# Patient Record
Sex: Female | Born: 1985 | Race: Black or African American | Hispanic: No | Marital: Single | Smoking: Former smoker
Health system: Southern US, Community
[De-identification: ages and names within clinical notes are randomized; demographics above are authoritative.]

## PROBLEM LIST (undated history)

## (undated) ENCOUNTER — Emergency Department (HOSPITAL_COMMUNITY): Disposition: A | Discharge status: Home / Self Care | Payer: Self-pay

## (undated) DIAGNOSIS — R0602 Shortness of breath: Secondary | ICD-10-CM

## (undated) DIAGNOSIS — J302 Other seasonal allergic rhinitis: Secondary | ICD-10-CM

## (undated) DIAGNOSIS — Z720 Tobacco use: Secondary | ICD-10-CM

---
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
## 2006-01-11 ENCOUNTER — Emergency Department (HOSPITAL_COMMUNITY): Admission: EM | Admit: 2006-01-11 | Discharge: 2006-01-11 | Payer: Self-pay | Admitting: Family Medicine

## 2009-06-16 ENCOUNTER — Emergency Department (HOSPITAL_COMMUNITY): Admission: EM | Admit: 2009-06-16 | Discharge: 2009-06-16 | Payer: Self-pay | Admitting: Emergency Medicine

## 2009-06-23 ENCOUNTER — Emergency Department (HOSPITAL_COMMUNITY): Admission: EM | Admit: 2009-06-23 | Discharge: 2009-06-23 | Payer: Self-pay | Admitting: Family Medicine

## 2010-10-18 ENCOUNTER — Inpatient Hospital Stay (INDEPENDENT_AMBULATORY_CARE_PROVIDER_SITE_OTHER)
Admission: RE | Admit: 2010-10-18 | Discharge: 2010-10-18 | Disposition: A | Discharge status: Home / Self Care | Payer: Self-pay | Source: Ambulatory Visit | Attending: Emergency Medicine | Admitting: Emergency Medicine

## 2010-10-18 DIAGNOSIS — J45909 Unspecified asthma, uncomplicated: Secondary | ICD-10-CM

## 2010-11-01 ENCOUNTER — Emergency Department (HOSPITAL_COMMUNITY)
Admission: EM | Admit: 2010-11-01 | Discharge: 2010-11-01 | Disposition: A | Discharge status: Home / Self Care | Payer: Self-pay | Attending: Emergency Medicine | Admitting: Emergency Medicine

## 2010-11-01 ENCOUNTER — Emergency Department (HOSPITAL_COMMUNITY): Payer: Self-pay

## 2010-11-01 DIAGNOSIS — R059 Cough, unspecified: Secondary | ICD-10-CM | POA: Insufficient documentation

## 2010-11-01 DIAGNOSIS — R0989 Other specified symptoms and signs involving the circulatory and respiratory systems: Secondary | ICD-10-CM | POA: Insufficient documentation

## 2010-11-01 DIAGNOSIS — R0609 Other forms of dyspnea: Secondary | ICD-10-CM | POA: Insufficient documentation

## 2010-11-01 DIAGNOSIS — J40 Bronchitis, not specified as acute or chronic: Secondary | ICD-10-CM | POA: Insufficient documentation

## 2010-11-01 DIAGNOSIS — R05 Cough: Secondary | ICD-10-CM | POA: Insufficient documentation

## 2011-02-11 ENCOUNTER — Emergency Department (HOSPITAL_COMMUNITY)
Admission: EM | Admit: 2011-02-11 | Discharge: 2011-02-11 | Disposition: A | Discharge status: Home / Self Care | Payer: Self-pay | Attending: Emergency Medicine | Admitting: Emergency Medicine

## 2011-02-11 ENCOUNTER — Encounter: Payer: Self-pay | Admitting: Emergency Medicine

## 2011-02-11 DIAGNOSIS — J45901 Unspecified asthma with (acute) exacerbation: Secondary | ICD-10-CM | POA: Insufficient documentation

## 2011-02-11 DIAGNOSIS — R0602 Shortness of breath: Secondary | ICD-10-CM | POA: Insufficient documentation

## 2011-02-11 MED ORDER — ALBUTEROL SULFATE HFA 108 (90 BASE) MCG/ACT IN AERS
2.0000 | INHALATION_SPRAY | RESPIRATORY_TRACT | Status: DC | PRN
  Administered 2011-02-11: 2 via RESPIRATORY_TRACT
  Filled 2011-02-11: qty 6.7

## 2011-02-11 MED ORDER — ALBUTEROL SULFATE (5 MG/ML) 0.5% IN NEBU
10.0000 mg | INHALATION_SOLUTION | Freq: Once | RESPIRATORY_TRACT | Status: AC
  Administered 2011-02-11: 5 mg via RESPIRATORY_TRACT
  Filled 2011-02-11: qty 1

## 2011-02-11 MED ORDER — IPRATROPIUM BROMIDE 0.02 % IN SOLN
0.5000 mg | Freq: Once | RESPIRATORY_TRACT | Status: AC
  Administered 2011-02-11: 0.5 mg via RESPIRATORY_TRACT
  Filled 2011-02-11: qty 2.5

## 2011-02-11 MED ORDER — ALBUTEROL SULFATE (5 MG/ML) 0.5% IN NEBU
5.0000 mg | INHALATION_SOLUTION | Freq: Once | RESPIRATORY_TRACT | Status: AC
  Administered 2011-02-11: 5 mg via RESPIRATORY_TRACT
  Filled 2011-02-11: qty 0.5

## 2011-02-11 MED ORDER — PREDNISONE 10 MG PO TABS: 20.0000 mg | ORAL_TABLET | Freq: Every day | ORAL | Status: AC

## 2011-02-11 MED ORDER — PREDNISONE 20 MG PO TABS
60.0000 mg | ORAL_TABLET | Freq: Once | ORAL | Status: AC
  Administered 2011-02-11: 60 mg via ORAL
  Filled 2011-02-11: qty 3

## 2011-02-11 NOTE — ED Provider Notes (Signed)
History     CSN: [id number] Arrival date & time: 02/11/2011  8:27 AM   First MD Initiated Contact with Patient 02/11/11 [phone]      Chief Complaint  Patient presents with  . Asthma    (Consider location/radiation/quality/duration/timing/severity/associated sxs/prior treatment) Patient is a 25 y.o. female presenting with shortness of breath.  Shortness of Breath  The current episode started yesterday. The onset was gradual. The problem occurs occasionally. The problem has been gradually worsening. The problem is moderate. The symptoms are relieved by nothing. The symptoms are aggravated by smoke exposure. Associated symptoms include chest pressure, shortness of breath and wheezing. Pertinent negatives include no fever, no rhinorrhea, no sore throat, no stridor and no cough. She has not inhaled smoke recently. She has had no prior hospitalizations. She has had no prior ICU admissions. She has had no prior intubations. Her past medical history is significant for asthma and past wheezing. She has been behaving normally.   here today with complaint of wheezing since 8 PM last night. States that her chest became "tight" around 12 midnight. She has a past medical history of asthma and has run out of her inhaler. She does not have a doctor or insurance. Her last asthma attack was in August and she went to [place] [facility]. Received albuterol in route and continues to wheeze in all 4 lips. Denies upper respiratory 6 infection runny nose sneezing or fever.   No past medical history on file.  No past surgical history on file.  No family history on file.  History  Substance Use Topics  . Smoking status: Not on file  . Smokeless tobacco: Not on file  . Alcohol Use: Not on file    OB History    Grav Para Term Preterm Abortions TAB SAB Ect Mult Living                  Review of Systems  Constitutional: Negative for fever.  HENT: Negative for sore throat and rhinorrhea.   Respiratory: Positive for  shortness of breath and wheezing. Negative for cough and stridor.   All other systems reviewed and are negative.    Allergies  Review of patient's allergies indicates no known allergies.  Home Medications  No current outpatient prescriptions on file.  BP 115/64  Pulse 125  Temp(Src) 98.3 F (36.8 C) (Oral)  Resp 22  SpO2 99%  Physical Exam  Constitutional: She is oriented to person, place, and time. She appears well-developed and well-nourished. No distress.  Eyes: Pupils are equal, round, and reactive to light.  Neck: Normal range of motion. Neck supple.  Cardiovascular:       tachycardia  Pulmonary/Chest: No respiratory distress. She has wheezes. She has no rales. She exhibits no tenderness.  Abdominal: Soft.  Musculoskeletal: Normal range of motion.  Neurological: She is alert and oriented to person, place, and time.  Skin: Skin is warm and dry.  Psychiatric: She has a normal mood and affect.    ED Course  Procedures (including critical care time)  Labs Reviewed - No data to display No results found.   No diagnosis found.    MDM  Asthma attack, out of albuterol inhaler.  Will get a pcp to follow up with or return to ER if worse.  Prednisone x 5 days.  Refill inhaler.        Jethro Bastos, NP 02/11/11 [phone]

## 2011-02-11 NOTE — ED Notes (Signed)
Per ems report pt coming from home where she began having difficulty breathing last night.  Pt was taking mucinex and had been out of here asthma medication at home.  ems gave 10mg  albuterol. 0.5mg  atrovent on arrival.

## 2011-02-11 NOTE — ED Notes (Signed)
Pt states she is feeling better after treatment.   Pt still having expiratory wheezes

## 2011-02-11 NOTE — ED Provider Notes (Signed)
Medical screening examination/treatment/procedure(s) were performed by non-physician practitioner and as supervising physician I was immediately available for consultation/collaboration.   Laray Anger, DO 02/11/11 Izell [place]

## 2011-02-26 ENCOUNTER — Observation Stay (HOSPITAL_COMMUNITY)
Admission: EM | Admit: 2011-02-26 | Discharge: 2011-02-28 | Disposition: A | Discharge status: Home / Self Care | Payer: Self-pay | Attending: Family Medicine | Admitting: Family Medicine

## 2011-02-26 ENCOUNTER — Emergency Department (HOSPITAL_COMMUNITY): Payer: Self-pay

## 2011-02-26 ENCOUNTER — Encounter (HOSPITAL_COMMUNITY): Payer: Self-pay | Admitting: *Deleted

## 2011-02-26 DIAGNOSIS — Z79899 Other long term (current) drug therapy: Secondary | ICD-10-CM | POA: Insufficient documentation

## 2011-02-26 DIAGNOSIS — Z720 Tobacco use: Secondary | ICD-10-CM | POA: Insufficient documentation

## 2011-02-26 DIAGNOSIS — F17200 Nicotine dependence, unspecified, uncomplicated: Secondary | ICD-10-CM | POA: Insufficient documentation

## 2011-02-26 DIAGNOSIS — E876 Hypokalemia: Secondary | ICD-10-CM | POA: Insufficient documentation

## 2011-02-26 DIAGNOSIS — J45901 Unspecified asthma with (acute) exacerbation: Principal | ICD-10-CM | POA: Insufficient documentation

## 2011-02-26 DIAGNOSIS — R0602 Shortness of breath: Secondary | ICD-10-CM | POA: Insufficient documentation

## 2011-02-26 DIAGNOSIS — J4540 Moderate persistent asthma, uncomplicated: Secondary | ICD-10-CM | POA: Insufficient documentation

## 2011-02-26 HISTORY — DX: Tobacco use: Z72.0

## 2011-02-26 HISTORY — DX: Shortness of breath: R06.02

## 2011-02-26 LAB — POCT I-STAT, CHEM 8
Glucose, Bld: 104 mg/dL — ABNORMAL HIGH (ref 70–99)
HCT: 41 % (ref 36.0–46.0)
Hemoglobin: 13.9 g/dL (ref 12.0–15.0)
Potassium: 3.1 mEq/L — ABNORMAL LOW (ref 3.5–5.1)

## 2011-02-26 LAB — DIFFERENTIAL
Basophils Relative: 1 % (ref 0–1)
Eosinophils Absolute: 0.1 10*3/uL (ref 0.0–0.7)
Monocytes Relative: 3 % (ref 3–12)
Neutrophils Relative %: 75 % (ref 43–77)

## 2011-02-26 LAB — CBC
Hemoglobin: 13 g/dL (ref 12.0–15.0)
MCH: 33.2 pg (ref 26.0–34.0)
MCHC: 34.8 g/dL (ref 30.0–36.0)
MCHC: 34.2 g/dL (ref 30.0–36.0)
Platelets: 228 10*3/uL (ref 150–400)
Platelets: 242 10*3/uL (ref 150–400)
RDW: 11.8 % (ref 11.5–15.5)

## 2011-02-26 MED ORDER — ALBUTEROL SULFATE HFA 108 (90 BASE) MCG/ACT IN AERS
2.0000 | INHALATION_SPRAY | Freq: Four times a day (QID) | RESPIRATORY_TRACT | Status: DC
  Administered 2011-02-26 (×3): 2 via RESPIRATORY_TRACT
  Filled 2011-02-26: qty 6.7

## 2011-02-26 MED ORDER — ALBUTEROL SULFATE (5 MG/ML) 0.5% IN NEBU
5.0000 mg | INHALATION_SOLUTION | Freq: Once | RESPIRATORY_TRACT | Status: AC
  Administered 2011-02-26: 5 mg via RESPIRATORY_TRACT
  Filled 2011-02-26: qty 1

## 2011-02-26 MED ORDER — ALBUTEROL SULFATE (5 MG/ML) 0.5% IN NEBU
2.5000 mg | INHALATION_SOLUTION | Freq: Once | RESPIRATORY_TRACT | Status: AC
  Administered 2011-02-26: 2.5 mg via RESPIRATORY_TRACT
  Filled 2011-02-26: qty 0.5

## 2011-02-26 MED ORDER — POTASSIUM CHLORIDE CRYS ER 20 MEQ PO TBCR
40.0000 meq | EXTENDED_RELEASE_TABLET | Freq: Once | ORAL | Status: AC
  Administered 2011-02-26: 40 meq via ORAL
  Filled 2011-02-26: qty 2

## 2011-02-26 MED ORDER — IPRATROPIUM BROMIDE 0.02 % IN SOLN
0.5000 mg | Freq: Once | RESPIRATORY_TRACT | Status: AC
  Administered 2011-02-26: 0.5 mg via RESPIRATORY_TRACT
  Filled 2011-02-26: qty 2.5

## 2011-02-26 MED ORDER — FLUTICASONE PROPIONATE HFA 220 MCG/ACT IN AERO
2.0000 | INHALATION_SPRAY | Freq: Two times a day (BID) | RESPIRATORY_TRACT | Status: DC
  Administered 2011-02-26 – 2011-02-28 (×5): 2 via RESPIRATORY_TRACT
  Filled 2011-02-26: qty 12

## 2011-02-26 MED ORDER — PREDNISONE 50 MG PO TABS
60.0000 mg | ORAL_TABLET | Freq: Every day | ORAL | Status: DC
  Administered 2011-02-27 – 2011-02-28 (×2): 60 mg via ORAL
  Filled 2011-02-26 (×2): qty 1

## 2011-02-26 MED ORDER — PREDNISONE 20 MG PO TABS
60.0000 mg | ORAL_TABLET | Freq: Every day | ORAL | Status: AC
  Administered 2011-02-26: 60 mg via ORAL
  Filled 2011-02-26: qty 1

## 2011-02-26 MED ORDER — ALBUTEROL SULFATE (5 MG/ML) 0.5% IN NEBU
5.0000 mg | INHALATION_SOLUTION | Freq: Once | RESPIRATORY_TRACT | Status: DC
  Filled 2011-02-26: qty 1

## 2011-02-26 MED ORDER — PREDNISONE 20 MG PO TABS
60.0000 mg | ORAL_TABLET | Freq: Every day | ORAL | Status: DC
  Filled 2011-02-26: qty 3

## 2011-02-26 MED ORDER — ALBUTEROL SULFATE (5 MG/ML) 0.5% IN NEBU: 10.0000 mg | INHALATION_SOLUTION | Freq: Once | RESPIRATORY_TRACT | Status: DC

## 2011-02-26 NOTE — ED Notes (Signed)
Report called to Tess, RN, on 4W. Pt aware and agreeable. Per Dr Mahala Menghini, who is present, pt does not need tele. NT, April, will transport pt to floor in w/c.

## 2011-02-26 NOTE — ED Notes (Signed)
Pt presents w/ asthma exacerbation, audible wheezing

## 2011-02-26 NOTE — ED Provider Notes (Signed)
7:10 AM Tissue care resumed from [place]. VS reviewed. The patient is alert and oriented x3.  Patient able to speak in full sentences, however she is still audibly wheezing.  Patient's O2 sats 98 on RA. NAD, no current complaints.  [facility] will be paged to admit the patient for asthma exacerbation per [facility] plan now that the i-STAT and CBC have returned.  40 by mouth potassium and given do to mild hypokalemia. . Results for orders placed during the hospital encounter of 02/26/11  CBC      Component Value Range   WBC 6.0  4.0 - 10.5 (K/uL)   RBC 3.95  3.87 - 5.11 (MIL/uL)   Hemoglobin 13.1  12.0 - 15.0 (g/dL)   HCT 16.1  09.6 - 04.5 (%)   MCV 95.2  78.0 - 100.0 (fL)   MCH 33.2  26.0 - 34.0 (pg)   MCHC 34.8  30.0 - 36.0 (g/dL)   RDW 40.9  81.1 - 91.4 (%)   Platelets 228  150 - 400 (K/uL)  DIFFERENTIAL      Component Value Range   Neutrophils Relative 75  43 - 77 (%)   Neutro Abs 4.5  1.7 - 7.7 (K/uL)   Lymphocytes Relative 21  12 - 46 (%)   Lymphs Abs 1.3  0.7 - 4.0 (K/uL)   Monocytes Relative 3  3 - 12 (%)   Monocytes Absolute 0.2  0.1 - 1.0 (K/uL)   Eosinophils Relative 1  0 - 5 (%)   Eosinophils Absolute 0.1  0.0 - 0.7 (K/uL)   Basophils Relative 1  0 - 1 (%)   Basophils Absolute 0.1  0.0 - 0.1 (K/uL)  POCT I-STAT, CHEM 8      Component Value Range   Sodium 141  135 - 145 (mEq/L)   Potassium 3.1 (*) 3.5 - 5.1 (mEq/L)   Chloride 104  96 - 112 (mEq/L)   BUN 7  6 - 23 (mg/dL)   Creatinine, Ser 7.82  0.50 - 1.10 (mg/dL)   Glucose, Bld 956 (*) 70 - 99 (mg/dL)   Calcium, Ion 2.13  0.86 - 1.32 (mmol/L)   TCO2 23  0 - 100 (mmol/L)   Hemoglobin 13.9  12.0 - 15.0 (g/dL)   HCT 57.8  46.9 - 62.9 (%)   Spoke with Dr. Allen Norris if [facility] who requests a peak flow and for the patient to be ambulated with O2 sats reevaluated.  These tests have been ordered depending on the results will be whether or not he decides to admit or discharge the patient.  [place], [place] 02/26/11 1005

## 2011-02-26 NOTE — ED Provider Notes (Signed)
History     CSN: [id number] Arrival date & time: 02/26/2011  1:00 AM   None     Chief Complaint  Patient presents with  . Asthma  . Shortness of Breath    (Consider location/radiation/quality/duration/timing/severity/associated sxs/prior treatment) Patient is a 25 y.o. female presenting with asthma and shortness of breath. The history is provided by the patient.  Asthma This is a recurrent problem. The current episode started today. The problem occurs constantly. The problem has been gradually worsening. Associated symptoms include chest pain and coughing. Pertinent negatives include no chills, fever or sore throat. Associated symptoms comments: She has a history of asthma with Albuterol inhaler at home. She started wheezing at around 1:00 and used the last of her inhaler. Around 7:00 wheezing became severe with unusual chest tightness. No fever, nausea or vomiting. .  Shortness of Breath  Associated symptoms include chest pain, cough, shortness of breath and wheezing. Pertinent negatives include no fever, no rhinorrhea and no sore throat. Her past medical history is significant for asthma.    Past Medical History  Diagnosis Date  . Asthma     History reviewed. No pertinent past surgical history.  History reviewed. No pertinent family history.  History  Substance Use Topics  . Smoking status: Current Everyday Smoker  . Smokeless tobacco: Not on file  . Alcohol Use: Yes    OB History    Grav Para Term Preterm Abortions TAB SAB Ect Mult Living                  Review of Systems  Constitutional: Negative for fever and chills.  HENT: Negative.  Negative for sore throat and rhinorrhea.   Respiratory: Positive for cough, shortness of breath and wheezing.   Cardiovascular: Positive for chest pain.  Gastrointestinal: Negative.   Musculoskeletal: Negative.   Skin: Negative.   Neurological: Negative.     Allergies  Review of patient's allergies indicates no known  allergies.  Home Medications   Current Outpatient Rx  Name Route Sig Dispense Refill  . ALBUTEROL SULFATE HFA 108 (90 BASE) MCG/ACT IN AERS Inhalation Inhale 2 puffs into the lungs every 6 (six) hours as needed.      Marland Kitchen FLUTICASONE-SALMETEROL 250-50 MCG/DOSE IN AEPB Inhalation Inhale 1 puff into the lungs every 12 (twelve) hours.      . GUAIFENESIN ER 600 MG PO TB12 Oral Take 1,200 mg by mouth 2 (two) times daily as needed. For congestion.       BP 131/69  Pulse 93  Temp(Src) 98.5 F (36.9 C) (Oral)  Resp 22  SpO2 97%  LMP 02/21/2011  Physical Exam  Constitutional: She appears well-developed and well-nourished.  HENT:  Head: Normocephalic.  Mouth/Throat: Oropharynx is clear and moist.  Neck: Normal range of motion. Neck supple.  Cardiovascular: Normal rate and regular rhythm.   Pulmonary/Chest: Effort normal. She has wheezes. She exhibits tenderness.  Abdominal: Soft. Bowel sounds are normal. There is no tenderness. There is no rebound and no guarding.  Musculoskeletal: Normal range of motion.  Neurological: She is alert. No cranial nerve deficit.  Skin: Skin is warm and dry. No rash noted.  Psychiatric: She has a normal mood and affect.    ED Course  Procedures (including critical care time)  Labs Reviewed - No data to display No results found.   No diagnosis found.    MDM  Patient continues to have significant wheezing after 3 treatments. She reports feeling "50%"improved. Dr. Jeraldine Loots in to see patient -  will admit.        Rodena Medin, PA 02/26/11 [phone]

## 2011-02-26 NOTE — H&P (Signed)
Dawn Robertson is an 25 y.o. female.   Chief Complaint: SOB PCP-Nil  CC-SOB x 24 hrs  HPI-Was at work at [employer] and all of a sudden her chest got tight, and she thought that this was because she was juts "busy" and not getting any water.  Was able to calm this down, and was able to calm this down until after work, 10:45 pm 11.30, around 11:30 pm felt tightness in the chest again, and therefore tried to take some deep slow breaths and took some tea.--Around midnight, let her Mom know and decided to come in Has not had "tighness like this in a long time.  Is a smoker and started at age 57--smokes about 2 packs per week-has an electronic tobacco device. Is an Asthmatic and was Diagnosed about the age of 7--has no meds at home.  Seems like ti was more of a seasonal allergy-but the last couple of x's she has been ill, unclear what triggers she has--seems maybe it was more when weather was cold or extremely cold. No [pets at home, nio bugs at home , some sick co-workers.  No fever, no chills, no CP.  No N/v/blurred vision Feels almost 9/10- in terms of her "normal self and 10/10 is normal  Has no inhalers and has no PCP  Past Medical History  Diagnosis Date  . Asthma     seasonal  . Shortness of breath     History reviewed. No pertinent past surgical history.  History reviewed. No pertinent family history. Social History:  reports that she has been smoking.  She does not have any smokeless tobacco history on file. She reports that she drinks alcohol. Her drug history not on file.  Allergies: No Known Allergies  Medications Prior to Admission  Medication Dose Route Frequency Provider Last Rate Last Dose  . albuterol (PROVENTIL HFA;VENTOLIN HFA) 108 (90 BASE) MCG/ACT inhaler 2 puff  2 puff Inhalation QID Pleas Koch, MD      . albuterol (PROVENTIL) (5 MG/ML) 0.5% nebulizer solution 2.5 mg  2.5 mg Nebulization Once Rodena Medin, PA   2.5 mg at 02/26/11 0231  . albuterol  (PROVENTIL) (5 MG/ML) 0.5% nebulizer solution 2.5 mg  2.5 mg Nebulization Once Rodena Medin, PA   2.5 mg at 02/26/11 0523  . albuterol (PROVENTIL) (5 MG/ML) 0.5% nebulizer solution 5 mg  5 mg Nebulization Once Rodena Medin, PA   5 mg at 02/26/11 0115  . albuterol (PROVENTIL) (5 MG/ML) 0.5% nebulizer solution 5 mg  5 mg Nebulization Once Rodena Medin, PA      . fluticasone (FLOVENT HFA) 220 MCG/ACT inhaler 2 puff  2 puff Inhalation BID Pleas Koch, MD      . ipratropium (ATROVENT) nebulizer solution 0.5 mg  0.5 mg Nebulization Once Rodena Medin, PA   0.5 mg at 02/26/11 0115  . potassium chloride SA (K-DUR,KLOR-CON) CR tablet 40 mEq  40 mEq Oral Once [employer], [place]   40 mEq at 02/26/11 0723  . predniSONE (DELTASONE) tablet 60 mg  60 mg Oral QAC breakfast Rodena Medin, PA   60 mg at 02/26/11 0230  . predniSONE (DELTASONE) tablet 60 mg  60 mg Oral QAC breakfast Pleas Koch, MD      . DISCONTD: albuterol (PROVENTIL) (5 MG/ML) 0.5% nebulizer solution 10 mg  10 mg Nebulization Once Rodena Medin, PA       Medications Prior to Admission  Medication Sig Dispense Refill  . guaiFENesin (  MUCINEX) 600 MG 12 hr tablet Take 1,200 mg by mouth 2 (two) times daily as needed. For congestion.         Results for orders placed during the hospital encounter of 02/26/11 (from the past 48 hour(s))  CBC     Status: Normal   Collection Time   02/26/11  5:38 AM      Component Value Range Comment   WBC 6.0  4.0 - 10.5 (K/uL)    RBC 3.95  3.87 - 5.11 (MIL/uL)    Hemoglobin 13.1  12.0 - 15.0 (g/dL)    HCT 16.1  09.6 - 04.5 (%)    MCV 95.2  78.0 - 100.0 (fL)    MCH 33.2  26.0 - 34.0 (pg)    MCHC 34.8  30.0 - 36.0 (g/dL)    RDW 40.9  81.1 - 91.4 (%)    Platelets 228  150 - 400 (K/uL)   DIFFERENTIAL     Status: Normal   Collection Time   02/26/11  5:38 AM      Component Value Range Comment   Neutrophils Relative 75  43 - 77 (%)    Neutro Abs 4.5  1.7 - 7.7 (K/uL)    Lymphocytes Relative 21  12 - 46  (%)    Lymphs Abs 1.3  0.7 - 4.0 (K/uL)    Monocytes Relative 3  3 - 12 (%)    Monocytes Absolute 0.2  0.1 - 1.0 (K/uL)    Eosinophils Relative 1  0 - 5 (%)    Eosinophils Absolute 0.1  0.0 - 0.7 (K/uL)    Basophils Relative 1  0 - 1 (%)    Basophils Absolute 0.1  0.0 - 0.1 (K/uL)   POCT I-STAT, CHEM 8     Status: Abnormal   Collection Time   02/26/11  5:54 AM      Component Value Range Comment   Sodium 141  135 - 145 (mEq/L)    Potassium 3.1 (*) 3.5 - 5.1 (mEq/L)    Chloride 104  96 - 112 (mEq/L)    BUN 7  6 - 23 (mg/dL)    Creatinine, Ser 7.82  0.50 - 1.10 (mg/dL)    Glucose, Bld 956 (*) 70 - 99 (mg/dL)    Calcium, Ion 2.13  1.12 - 1.32 (mmol/L)    TCO2 23  0 - 100 (mmol/L)    Hemoglobin 13.9  12.0 - 15.0 (g/dL)    HCT 08.6  57.8 - 46.9 (%)    Dg Chest 2 View  02/26/2011  *RADIOLOGY REPORT*  Clinical Data: Cough, congestion, shortness of breath and mid chest pain.  CHEST - 2 VIEW  Comparison: Chest radiograph performed 11/01/2010  Findings: The lungs are well-aerated and clear.  There is no evidence of focal opacification, pleural effusion or pneumothorax.  The heart is normal in size; the mediastinal contour is within normal limits.  No acute osseous abnormalities are seen.  IMPRESSION: No acute cardiopulmonary process seen.  Original Report Authenticated By: Tonia Ghent, M.D.    Review of Systems  Constitutional: Negative for fever, chills and weight loss.  HENT: Negative.   Eyes: Negative.   Respiratory: Positive for cough and wheezing.   Cardiovascular: Negative for chest pain, palpitations, claudication and leg swelling.  Gastrointestinal: Negative.   Genitourinary: Negative.   Musculoskeletal: Negative.   Neurological: Negative.  Negative for weakness.  Endo/Heme/Allergies: Negative.   Psychiatric/Behavioral: Negative.     Blood pressure 104/65, pulse 78, temperature 97.9 F (  36.6 C), temperature source Oral, resp. rate 16, last menstrual period 02/21/2011, SpO2  100.00%. Physical Exam  Constitutional: She is oriented to person, place, and time. She appears well-developed and well-nourished. No distress.  HENT:  Head: Normocephalic and atraumatic.  Right Ear: External ear normal.  Left Ear: External ear normal.  Mouth/Throat: No oropharyngeal exudate.  Eyes: Conjunctivae and EOM are normal.  Neck: Normal range of motion. Neck supple.  Cardiovascular: Normal rate and regular rhythm.   No murmur heard. Respiratory: She has wheezes (faint expiratory wheezde Lower lung fileds posterio-basally). She has no rales. She exhibits no tenderness.  GI: Soft. Bowel sounds are normal.  Musculoskeletal: Normal range of motion.  Neurological: She is alert and oriented to person, place, and time. She has normal reflexes. No cranial nerve deficit. Coordination normal.  Skin: Skin is warm and dry.  Psychiatric: She has a normal mood and affect. Her behavior is normal. Judgment and thought content normal.     Assessment/Plan Patient Active Hospital Problem List: Asthma ()   Assessment:  Peak Flow was 270 in ED Started Albuterol +Flvoent with spacer Appreciate Rt input Continue steroids po If better in am, would likely D/c into safe care of a PCP Shortness of breath ()   Assessment :2/2 to above Tobacco abuse ()   Assessment: Encouraged t0 keep up good efforts Hypokalemia (02/26/2011)   Assessment: likely 2/2 to Inhalers       Domnic Vantol,JAI 02/26/2011, 10:02 AM

## 2011-02-26 NOTE — ED Notes (Signed)
Pt transferred to TCU rm 27. A&Ox3. No c/o at present. Mild exp wheezing on auscultation.

## 2011-02-27 MED ORDER — ALBUTEROL SULFATE (5 MG/ML) 0.5% IN NEBU
5.0000 mg | INHALATION_SOLUTION | Freq: Once | RESPIRATORY_TRACT | Status: AC
  Administered 2011-02-27: 5 mg via RESPIRATORY_TRACT
  Filled 2011-02-27: qty 1

## 2011-02-27 MED ORDER — ALBUTEROL SULFATE HFA 108 (90 BASE) MCG/ACT IN AERS
2.0000 | INHALATION_SPRAY | RESPIRATORY_TRACT | Status: DC
Start: 2011-02-27 — End: 2011-02-27
  Administered 2011-02-27 (×2): 2 via RESPIRATORY_TRACT
  Filled 2011-02-27: qty 6.7

## 2011-02-27 NOTE — ED Provider Notes (Signed)
Medical screening examination/treatment/procedure(s) were performed by non-physician practitioner and as supervising physician I was immediately available for consultation/collaboration.   Cloee Dunwoody, MD 02/27/11 0009 

## 2011-02-27 NOTE — Progress Notes (Signed)
[facility]OSPITAL[facility] progress note    Interval h/o:-  25 y/o AAF with mild asthma, presented 12/1 with Asthma exacerb-no triggers,smokes occ-F/h Allergies and Asthma.  Felt better on Day of admit, but worsened overnight requiring neb    Subjective: Feels terrible.  Wheezing +.  SLighlty SOB.  No n/v/cp  Treatment Team:  Pleas Koch, MD Objective: Vital signs in last 24 hours: Temp:  [97.3 F (36.3 C)-98.8 F (37.1 C)] 97.9 F (36.6 C) (12/02 0451) Pulse Rate:  [55-84] 55  (12/02 0451) Resp:  [16-18] 16  (12/02 0451) BP: (101-117)/(66-72) 101/66 mmHg (12/02 0451) SpO2:  [98 %-100 %] 100 % (12/02 1232) Weight:  [68.4 kg (150 lb 12.7 oz)] 150 lb 12.7 oz (68.4 kg) (12/01 1530) Weight change:   Intake/Output Summary (Last 24 hours) at 02/27/11 1317 Last data filed at 02/27/11 0300  Gross per 24 hour  Intake    480 ml  Output      0 ml  Net    480 ml   Mouth/Throat: No oropharyngeal exudate.  Eyes: Conjunctivae and EOM are normal.  Neck: Normal range of motion. Neck supple.  Cardiovascular: Normal rate and regular rhythm.  No murmur heard.  Respiratory: She has wheezes loud wheeze over all lung fields. She has no rales. She exhibits no tenderness.  GI: Soft. Bowel sounds are normal.  Musculoskeletal: Normal range of motion.  Neurological: She is alert and oriented to person, place, and time. She has normal reflexes. No cranial nerve deficit. Coordination normal.  Skin: Skin is warm and dry.  Psychiatric: She has a normal mood and affect. Her behavior is normal. Judgment and thought content normal    Lab Results:  Basename 02/26/11 0554  NA 141  K 3.1*  CL 104  CO2 --  GLUCOSE 104*  BUN 7  CREATININE 0.70  CALCIUM --  MG --  PHOS --    Basename 02/26/11 1630 02/26/11 0554 02/26/11 0538  WBC 7.7 -- 6.0  NEUTROABS -- -- 4.5  HGB 13.0 13.9 --  HCT 38.0 41.0 --  MCV 95.2 -- 95.2  PLT 242 -- 228   No results found for this basename:  CKTOTAL:3,CKMB:3,CKMBINDEX:3,TROPONINI:3 in the last 72 hours No results found for this basename: POCBNP:3 in the last 72 hours No results found for this basename: DDIMER:2 in the last 72 hours No results found for this basename: HGBA1C:2 in the last 72 hours No results found for this basename: CHOL:2,HDL:2,LDLCALC:2,TRIG:2,CHOLHDL:2,LDLDIRECT:2 in the last 72 hours No results found for this basename: TSH,T4TOTAL,FREET3,T3FREE,THYROIDAB in the last 72 hours No results found for this basename: VITAMINB12:2,FOLATE:2,FERRITIN:2,TIBC:2,IRON:2,RETICCTPCT:2 in the last 72 hours Micro Results: No results found for this or any previous visit (from the past 240 hour(s)).        Medications: I have reviewed the patient's current medications. Scheduled Meds:   . albuterol  2 puff Inhalation Q2H  . albuterol  5 mg Nebulization Once  . fluticasone  2 puff Inhalation BID  . predniSONE  60 mg Oral QAC breakfast  . DISCONTD: albuterol  2 puff Inhalation QID  . DISCONTD: albuterol  5 mg Nebulization Once  . DISCONTD: predniSONE  60 mg Oral QAC breakfast   Continuous Infusions:  PRN Meds:.   Assessment/Plan: Assessment/Plan  Patient Active Hospital Problem List: Asthma () Assessment:  Peak Flow was 200 at first attempt this am at bedside-will transition back to Nebs-keep PO steroids and reassess-she will likely need 1 more day of in hospital management and can be d/c  if her peak flow is in the "green area" ED  Started Albuterol +Flvoent with spacer   Shortness of breath () Assessment :2/2 to above  Tobacco abuse () Assessment: Encouraged t0 keep up good effort with cessation  Hypokalemia (02/26/2011) Assessment: likely 2/2 to Inhalers   LOS: 1 day   [facility] 02/27/2011, 1:17 PM

## 2011-02-27 NOTE — ED Provider Notes (Signed)
Medical screening examination/treatment/procedure(s) were performed by non-physician practitioner and as supervising physician I was immediately available for consultation/collaboration.   Curvin Hunger, MD 02/27/11 0009 

## 2011-02-28 LAB — BASIC METABOLIC PANEL
BUN: 9 mg/dL (ref 6–23)
Calcium: 9.4 mg/dL (ref 8.4–10.5)
GFR calc Af Amer: >90 mL/min (ref >90)
GFR calc non Af Amer: >90 mL/min (ref >90)
Glucose, Bld: 103 mg/dL — ABNORMAL HIGH (ref 70–99)
Potassium: 3.5 mEq/L (ref 3.5–5.1)

## 2011-02-28 MED ORDER — ALBUTEROL SULFATE HFA 108 (90 BASE) MCG/ACT IN AERS: 2.0000 | INHALATION_SPRAY | Freq: Four times a day (QID) | RESPIRATORY_TRACT | Status: DC | PRN

## 2011-02-28 MED ORDER — ALBUTEROL SULFATE HFA 108 (90 BASE) MCG/ACT IN AERS
2.0000 | INHALATION_SPRAY | RESPIRATORY_TRACT | Status: DC | PRN
  Administered 2011-02-28: 2 via RESPIRATORY_TRACT
  Filled 2011-02-28: qty 6.7

## 2011-02-28 MED ORDER — PREDNISONE 10 MG PO TABS: 10.0000 mg | ORAL_TABLET | Freq: Every day | ORAL | Status: AC

## 2011-02-28 MED ORDER — FLUTICASONE-SALMETEROL 250-50 MCG/DOSE IN AEPB: 1.0000 | INHALATION_SPRAY | Freq: Two times a day (BID) | RESPIRATORY_TRACT | Status: DC

## 2011-02-28 NOTE — Discharge Summary (Signed)
Admit date: 02/26/2011 Discharge date: 02/28/2011  Primary Care Physician:  No primary provider on file.   Discharge Diagnoses:   No resolved problems to display.  Active Hospital Problems  Diagnoses Date Noted   . Asthma    . Hypokalemia 02/26/2011   . Shortness of breath    . Tobacco abuse      Resolved Hospital Problems  Diagnoses Date Noted Date Resolved     DISCHARGE MEDICATION: Current Discharge Medication List    START taking these medications   Details  predniSONE (DELTASONE) 10 MG tablet Take 1 tablet (10 mg total) by mouth daily. Qty: 42 tablet, Refills: 0      CONTINUE these medications which have CHANGED   Details  albuterol (PROVENTIL HFA;VENTOLIN HFA) 108 (90 BASE) MCG/ACT inhaler Inhale 2 puffs into the lungs every 6 (six) hours as needed. Qty: 2 Inhaler, Refills: 0    Fluticasone-Salmeterol (ADVAIR) 250-50 MCG/DOSE AEPB Inhale 1 puff into the lungs every 12 (twelve) hours. Qty: 60 each, Refills: 0      STOP taking these medications     guaiFENesin (MUCINEX) 600 MG 12 hr tablet            Consults: Treatment Team:  Pleas Koch, MD   SIGNIFICANT DIAGNOSTIC STUDIES:  Dg Chest 2 View  02/26/2011  *RADIOLOGY REPORT*  Clinical Data: Cough, congestion, shortness of breath and mid chest pain.  CHEST - 2 VIEW  Comparison: Chest radiograph performed 11/01/2010  Findings: The lungs are well-aerated and clear.  There is no evidence of focal opacification, pleural effusion or pneumothorax.  The heart is normal in size; the mediastinal contour is within normal limits.  No acute osseous abnormalities are seen.  IMPRESSION: No acute cardiopulmonary process seen.  Original Report Authenticated By: Tonia Ghent, M.D.     No results found for this or any previous visit (from the past 240 hour(s)).  BRIEF ADMITTING H & P:  Was at work at [employer] and all of a sudden her chest got tight, and she thought that this was because she was juts "busy" and not  getting any water. Was able to calm this down, and was able to calm this down until after work, 10:45 pm 11.30, around 11:30 pm felt tightness in the chest again, and therefore tried to take some deep slow breaths and took some tea.--Around midnight, let her Mom know and decided to come in  Has not had "tighness like this in a long time. Is a smoker and started at age 31--smokes about 2 packs per week-has an electronic tobacco device.  Is an Asthmatic and was Diagnosed about the age of 7--has no meds at home. Seems like ti was more of a seasonal allergy-but the last couple of x's she has been ill, unclear what triggers she has--seems maybe it was more when weather was cold or extremely cold.  No [pets at home, nio bugs at home , some sick co-workers. No fever, no chills, no CP. No N/v/blurred vision  Feels almost 9/10- in terms of her "normal self and 10/10 is normal   CBC Status: Normal    Collection Time    02/26/11 5:38 AM   Component  Value  Range  Comment    WBC  6.0  4.0 - 10.5 (K/uL)     RBC  3.95  3.87 - 5.11 (MIL/uL)     Hemoglobin  13.1  12.0 - 15.0 (g/dL)     HCT  16.1  09.6 - 46.0 (%)  MCV  95.2  78.0 - 100.0 (fL)     MCH  33.2  26.0 - 34.0 (pg)     MCHC  34.8  30.0 - 36.0 (g/dL)     RDW  16.1  09.6 - 15.5 (%)     Platelets  228  150 - 400 (K/uL)    DIFFERENTIAL Status: Normal    Collection Time    02/26/11 5:38 AM   Component  Value  Range  Comment    Neutrophils Relative  75  43 - 77 (%)     Neutro Abs  4.5  1.7 - 7.7 (K/uL)     Lymphocytes Relative  21  12 - 46 (%)     Lymphs Abs  1.3  0.7 - 4.0 (K/uL)     Monocytes Relative  3  3 - 12 (%)     Monocytes Absolute  0.2  0.1 - 1.0 (K/uL)     Eosinophils Relative  1  0 - 5 (%)     Eosinophils Absolute  0.1  0.0 - 0.7 (K/uL)     Basophils Relative  1  0 - 1 (%)     Basophils Absolute  0.1  0.0 - 0.1 (K/uL)    POCT I-STAT, CHEM 8 Status: Abnormal    Collection Time    02/26/11 5:54 AM   Component  Value  Range  Comment      Sodium  141  135 - 145 (mEq/L)     Potassium  3.1 (*)  3.5 - 5.1 (mEq/L)     Chloride  104  96 - 112 (mEq/L)     BUN  7  6 - 23 (mg/dL)     Creatinine, Ser  0.45  0.50 - 1.10 (mg/dL)     Glucose, Bld  409 (*)  70 - 99 (mg/dL)     Calcium, Ion  8.11  1.12 - 1.32 (mmol/L)     TCO2  23  0 - 100 (mmol/L)     Hemoglobin  13.9  12.0 - 15.0 (g/dL)     HCT  91.4  78.2 - 46.0 (%)     Dg Chest 2 View  No resolved problems to display.  Active Hospital Problems  Diagnoses Date Noted   . Asthma: She was started on IV steroids continue albuterol and fluticasone. She did quite well overnight. One day prior to admission she was saturating 95% narrowing able to ambulate and she was discharged in stable condition.     . Hypokalemia: This is probably secondary to albuterol this was repleted. 02/26/2011   . Tobacco abuse: Counseling .      Resolved Hospital Problems  Diagnoses Date Noted Date Resolved     Disposition and Follow-up:   Follow up with PCP and to 4 weeks Discharge Orders    Future Orders Please Complete By Expires   Diet - low sodium heart healthy      Increase activity slowly           DISCHARGE EXAM:  Mouth/Throat: No oropharyngeal exudate.  Eyes: Conjunctivae and EOM are normal.  Neck: Normal range of motion. Neck supple.  Cardiovascular: Normal rate and regular rhythm.  No murmur heard.  Respiratory: Good air movement and no audible wheezing.Marland Kitchen  GI: Soft. Bowel sounds are normal.  Musculoskeletal: Normal range of motion.  Neurological: She is alert and oriented to person, place, and time. She has normal reflexes. No cranial nerve deficit. Coordination normal.  Skin: Skin is warm  and dry.  Psychiatric: She has a normal mood and affect. Her behavior is normal. Judgment and thought content normal       Blood pressure 93/53, pulse 60, temperature 97.7 F (36.5 C), temperature source Oral, resp. rate 16, height 5\' 2"  (1.575 m), weight 68.4 kg (150 lb 12.7 oz), last  menstrual period 02/21/2011, SpO2 100.00%.   Basename 02/28/11 0550 02/26/11 0554  NA 139 141  K 3.5 3.1*  CL 106 104  CO2 25 --  GLUCOSE 103* 104*  BUN 9 7  CREATININE 0.51 0.70  CALCIUM 9.4 --  MG -- --  PHOS -- --   No results found for this basename: AST:2,ALT:2,ALKPHOS:2,BILITOT:2,PROT:2,ALBUMIN:2 in the last 72 hours No results found for this basename: LIPASE:2,AMYLASE:2 in the last 72 hours  Basename 02/26/11 1630 02/26/11 0554 02/26/11 0538  WBC 7.7 -- 6.0  NEUTROABS -- -- 4.5  HGB 13.0 13.9 --  HCT 38.0 41.0 --  MCV 95.2 -- 95.2  PLT 242 -- 228    Signed: Marinda Elk M.D. 02/28/2011, 11:34 AM

## 2011-04-13 ENCOUNTER — Encounter (HOSPITAL_BASED_OUTPATIENT_CLINIC_OR_DEPARTMENT_OTHER): Payer: Self-pay | Admitting: *Deleted

## 2011-04-13 ENCOUNTER — Emergency Department (HOSPITAL_BASED_OUTPATIENT_CLINIC_OR_DEPARTMENT_OTHER)
Admission: EM | Admit: 2011-04-13 | Discharge: 2011-04-13 | Disposition: A | Discharge status: Home / Self Care | Payer: Self-pay | Attending: Emergency Medicine | Admitting: Emergency Medicine

## 2011-04-13 DIAGNOSIS — J45909 Unspecified asthma, uncomplicated: Secondary | ICD-10-CM | POA: Insufficient documentation

## 2011-04-13 DIAGNOSIS — F17200 Nicotine dependence, unspecified, uncomplicated: Secondary | ICD-10-CM | POA: Insufficient documentation

## 2011-04-13 MED ORDER — ALBUTEROL SULFATE (5 MG/ML) 0.5% IN NEBU
INHALATION_SOLUTION | RESPIRATORY_TRACT | Status: AC
  Administered 2011-04-13: 5 mg via RESPIRATORY_TRACT
  Filled 2011-04-13: qty 1

## 2011-04-13 MED ORDER — IPRATROPIUM BROMIDE 0.02 % IN SOLN
RESPIRATORY_TRACT | Status: AC
  Administered 2011-04-13: 0.5 mg
  Filled 2011-04-13: qty 2.5

## 2011-04-13 MED ORDER — PREDNISONE 10 MG PO TABS: 20.0000 mg | ORAL_TABLET | Freq: Every day | ORAL | Status: DC

## 2011-04-13 MED ORDER — PREDNISONE 20 MG PO TABS: 60.0000 mg | ORAL_TABLET | Freq: Every day | ORAL | Status: AC

## 2011-04-13 MED ORDER — PREDNISONE 50 MG PO TABS
60.0000 mg | ORAL_TABLET | Freq: Once | ORAL | Status: AC
  Administered 2011-04-13: 60 mg via ORAL
  Filled 2011-04-13: qty 1

## 2011-04-13 MED ORDER — ALBUTEROL SULFATE HFA 108 (90 BASE) MCG/ACT IN AERS
1.0000 | INHALATION_SPRAY | Freq: Four times a day (QID) | RESPIRATORY_TRACT | Status: DC | PRN
  Administered 2011-04-13: 2 via RESPIRATORY_TRACT
  Filled 2011-04-13: qty 6.7

## 2011-04-13 MED ORDER — ALBUTEROL SULFATE (5 MG/ML) 0.5% IN NEBU
5.0000 mg | INHALATION_SOLUTION | RESPIRATORY_TRACT | Status: DC
  Administered 2011-04-13 (×2): 5 mg via RESPIRATORY_TRACT
  Filled 2011-04-13: qty 1

## 2011-04-13 NOTE — ED Provider Notes (Signed)
History     CSN: [id number]  Arrival date & time 04/13/11  1041   First MD Initiated Contact with Patient 04/13/11 1104      Chief Complaint  Patient presents with  . Wheezing  . Asthma    (Consider location/radiation/quality/duration/timing/severity/associated sxs/prior treatment) HPI Patient presents to the emergency room with complaints of an asthma attack. She's been having trouble over the last week or so. She has had cold symptoms and congestion associated with wheezing and cough. She's been trying her over-the-counter ventilating. She is supposed to be taking Advair but she has not been able to afford that. He should also had some leftover steroids that she had been taking. Patient denies any fevers or chills. Patient does smoke. She started to have a lot of trouble with her breathing today so she came to the emergency room. Patient states in November she had a severe attack and was admitted to the hospital for a few days.  Prior to my evaluation per protocol patient was given an albuterol nebulizer treatment. She is feeling much better now. Past Medical History  Diagnosis Date  . Asthma     seasonal  . Shortness of breath   . Tobacco abuse     History reviewed. No pertinent past surgical history.  Family History  Problem Relation Age of Onset  . Cancer Mother     breast  . Asthma Mother   . Asthma Father   . Diabetes type II Paternal Aunt     History  Substance Use Topics  . Smoking status: Current Everyday Smoker -- 0.2 packs/day  . Smokeless tobacco: Not on file  . Alcohol Use: Yes    OB History    Grav Para Term Preterm Abortions TAB SAB Ect Mult Living                  Review of Systems  All other systems reviewed and are negative.    Allergies  Review of patient's allergies indicates no known allergies.  Home Medications   Current Outpatient Rx  Name Route Sig Dispense Refill  . ALBUTEROL SULFATE HFA 108 (90 BASE) MCG/ACT IN AERS Inhalation  Inhale 2 puffs into the lungs every 6 (six) hours as needed. 2 Inhaler 0  . FLUTICASONE-SALMETEROL 250-50 MCG/DOSE IN AEPB Inhalation Inhale 1 puff into the lungs every 12 (twelve) hours. 60 each 0    BP 112/71  Pulse 112  Temp(Src) 98.6 F (37 C) (Oral)  Resp 24  SpO2 97%  LMP 03/18/2011  Physical Exam  Nursing note and vitals reviewed. Constitutional: She appears well-developed and well-nourished. No distress.  HENT:  Head: Normocephalic and atraumatic.  Right Ear: External ear normal.  Left Ear: External ear normal.  Eyes: Conjunctivae are normal. Right eye exhibits no discharge. Left eye exhibits no discharge. No scleral icterus.  Neck: Neck supple. No tracheal deviation present.  Cardiovascular: Normal rate, regular rhythm and intact distal pulses.   Pulmonary/Chest: Effort normal. No accessory muscle usage or stridor. Not tachypneic. No respiratory distress. She has wheezes. She has no rales.  Abdominal: Soft. Bowel sounds are normal. She exhibits no distension. There is no tenderness. There is no rebound and no guarding.  Musculoskeletal: She exhibits no edema and no tenderness.  Neurological: She is alert. She has normal strength. No sensory deficit. Cranial nerve deficit:  no gross defecits noted. She exhibits normal muscle tone. She displays no seizure activity. Coordination normal.  Skin: Skin is warm and dry. No rash noted.  Psychiatric: She has a normal mood and affect.    ED Course  Procedures (including critical care time)  Medications  albuterol (PROVENTIL) (5 MG/ML) 0.5% nebulizer solution 5 mg (5 mg Nebulization Not Given 04/13/11 1202)  albuterol (PROVENTIL HFA;VENTOLIN HFA) 108 (90 BASE) MCG/ACT inhaler 1-2 puff (not administered)  predniSONE (DELTASONE) 10 MG tablet (not administered)  predniSONE (DELTASONE) 20 MG tablet (not administered)  ipratropium (ATROVENT) 0.02 % nebulizer solution (0.5 mg  Given 04/13/11 1058)  predniSONE (DELTASONE) tablet 60 mg (60  mg Oral Given 04/13/11 1142)    Labs Reviewed - No data to display No results found.   Diagnosis: Asthma attack   MDM  Patient is feeling much better at this time. She has been given albuterol Atrovent nebulizer treatments. On repeat exam the patient is not wheezing she is able to speak in full sentences without any difficulty. I will start the patient on a course of steroids.  I did discuss various options for outpatient followup. She's been given the resource guide and specifically the [facility] telephone numbers.          Celene Kras, MD 04/13/11 1239

## 2011-04-13 NOTE — ED Notes (Signed)
Pt amb to room 4 with quick steady gait, pt noted dyspnic with exertion and conversation, resp labored. Rt at bedside, lungs with wheezes throughout all fields a+p x2, insp and exp. Pt reports having "cold" for several weeks and taking mucinex x 3 doses.

## 2011-07-08 ENCOUNTER — Encounter (HOSPITAL_COMMUNITY): Payer: Self-pay

## 2011-07-08 ENCOUNTER — Emergency Department (INDEPENDENT_AMBULATORY_CARE_PROVIDER_SITE_OTHER)
Admission: EM | Admit: 2011-07-08 | Discharge: 2011-07-08 | Disposition: A | Discharge status: Home / Self Care | Payer: Self-pay | Source: Home / Self Care | Attending: Emergency Medicine | Admitting: Emergency Medicine

## 2011-07-08 DIAGNOSIS — J45901 Unspecified asthma with (acute) exacerbation: Secondary | ICD-10-CM

## 2011-07-08 DIAGNOSIS — IMO0001 Reserved for inherently not codable concepts without codable children: Secondary | ICD-10-CM

## 2011-07-08 HISTORY — DX: Other seasonal allergic rhinitis: J30.2

## 2011-07-08 MED ORDER — CETIRIZINE-PSEUDOEPHEDRINE ER 5-120 MG PO TB12: 1.0000 | ORAL_TABLET | Freq: Every day | ORAL | Status: DC

## 2011-07-08 MED ORDER — ALBUTEROL SULFATE (5 MG/ML) 0.5% IN NEBU
5.0000 mg | INHALATION_SOLUTION | Freq: Once | RESPIRATORY_TRACT | Status: AC
  Administered 2011-07-08: 5 mg via RESPIRATORY_TRACT

## 2011-07-08 MED ORDER — IPRATROPIUM BROMIDE 0.02 % IN SOLN
0.5000 mg | Freq: Once | RESPIRATORY_TRACT | Status: AC
  Administered 2011-07-08: 0.5 mg via RESPIRATORY_TRACT

## 2011-07-08 MED ORDER — ALBUTEROL SULFATE (5 MG/ML) 0.5% IN NEBU: 5.0000 mg | INHALATION_SOLUTION | RESPIRATORY_TRACT | Status: DC | PRN

## 2011-07-08 MED ORDER — PREDNISONE 20 MG PO TABS
ORAL_TABLET | ORAL | Status: AC
  Filled 2011-07-08: qty 2

## 2011-07-08 MED ORDER — ALBUTEROL SULFATE HFA 108 (90 BASE) MCG/ACT IN AERS: 1.0000 | INHALATION_SPRAY | Freq: Four times a day (QID) | RESPIRATORY_TRACT | Status: DC | PRN

## 2011-07-08 MED ORDER — PREDNISONE 20 MG PO TABS
40.0000 mg | ORAL_TABLET | Freq: Once | ORAL | Status: AC
  Administered 2011-07-08: 40 mg via ORAL

## 2011-07-08 MED ORDER — ALBUTEROL SULFATE (5 MG/ML) 0.5% IN NEBU
INHALATION_SOLUTION | RESPIRATORY_TRACT | Status: AC
  Filled 2011-07-08: qty 1

## 2011-07-08 MED ORDER — FLUTICASONE PROPIONATE 50 MCG/ACT NA SUSP: 2.0000 | Freq: Two times a day (BID) | NASAL | Status: DC

## 2011-07-08 MED ORDER — FLUTICASONE-SALMETEROL 250-50 MCG/DOSE IN AEPB: 1.0000 | INHALATION_SPRAY | Freq: Two times a day (BID) | RESPIRATORY_TRACT | Status: DC

## 2011-07-08 NOTE — ED Provider Notes (Addendum)
History     CSN: [id number]  Arrival date & time 07/08/11  1455   First MD Initiated Contact with Patient 07/08/11 1500      Chief Complaint  Patient presents with  . Shortness of Breath  . Asthma    (Consider location/radiation/quality/duration/timing/severity/associated sxs/prior treatment) HPI Comments: Patient has been hoarse and wheezing with tightness and shortness of breath with wheezing. She ran out of her allergy medicine yesterday and started with shortness of breath this morning. Denies any fever does have some mild runny and congested nose. She had ran out of her nebulizer albuterol. She describes it typically the seasonal allergies trigger asthma and she needs to start taking allergy medicine and using albuterol with increased frequency.  Patient is a 26 y.o. female presenting with shortness of breath and asthma. The history is provided by the patient.  Shortness of Breath  The current episode started today. The problem has been gradually worsening. The problem is moderate. Associated symptoms include chest pain, shortness of breath and wheezing. Pertinent negatives include no fever, no rhinorrhea, no sore throat and no cough. There was no intake of a foreign body. Her past medical history is significant for asthma, past wheezing and asthma in the family. Her past medical history does not include bronchiolitis. Urine output has been normal. There were no sick contacts.  Asthma Associated symptoms include chest pain and shortness of breath.    Past Medical History  Diagnosis Date  . Asthma     seasonal  . Shortness of breath   . Tobacco abuse   . Seasonal allergies     History reviewed. No pertinent past surgical history.  Family History  Problem Relation Age of Onset  . Cancer Mother     breast  . Asthma Mother   . Asthma Father   . Diabetes type II Paternal Aunt     History  Substance Use Topics  . Smoking status: Former Games developer  . Smokeless tobacco: Not  on file  . Alcohol Use: Yes    OB History    Grav Para Term Preterm Abortions TAB SAB Ect Mult Living                  Review of Systems  Constitutional: Negative for fever, activity change and fatigue.  HENT: Negative for sore throat and rhinorrhea.   Respiratory: Positive for shortness of breath and wheezing. Negative for cough.   Cardiovascular: Positive for chest pain.    Allergies  Review of patient's allergies indicates no known allergies.  Home Medications   Current Outpatient Rx  Name Route Sig Dispense Refill  . ALBUTEROL SULFATE HFA 108 (90 BASE) MCG/ACT IN AERS Inhalation Inhale 2 puffs into the lungs every 6 (six) hours as needed. 2 Inhaler 0  . ALBUTEROL SULFATE (5 MG/ML) 0.5% IN NEBU Nebulization Take 1 mL (5 mg total) by nebulization every 4 (four) hours as needed for wheezing or shortness of breath. 20 mL 1  . CETIRIZINE-PSEUDOEPHEDRINE ER 5-120 MG PO TB12 Oral Take 1 tablet by mouth daily. 30 tablet 0  . FLUTICASONE PROPIONATE 50 MCG/ACT NA SUSP Nasal Place 2 sprays into the nose 2 (two) times daily. 16 g 2  . FLUTICASONE-SALMETEROL 250-50 MCG/DOSE IN AEPB Inhalation Inhale 1 puff into the lungs every 12 (twelve) hours. 60 each 1    BP 123/68  Pulse 95  Temp(Src) 98.3 F (36.8 C) (Oral)  Resp 16  SpO2 99%  LMP 07/01/2011  Physical Exam  Nursing note  and vitals reviewed. Constitutional: She appears well-developed and well-nourished.  HENT:  Head: Normocephalic.  Eyes: Conjunctivae are normal.  Neck: No JVD present.  Pulmonary/Chest: Accessory muscle usage present. Tachypnea noted. She is in respiratory distress. She has wheezes. She has no rales. She exhibits no tenderness.  Abdominal: Soft.  Lymphadenopathy:    She has no cervical adenopathy.  Skin: No rash noted. No erythema.    ED Course  Procedures (including critical care time)  Labs Reviewed - No data to display No results found.   1. Asthma exacerbation, allergic       MDM    Patient presents urgent care with an acute asthma exacerbation as he ran out of allergy medicines. He also ran out of albuterol nebulizer solution        Jimmie Molly, MD 07/08/11 1553  Jimmie Molly, MD 07/08/11 1559

## 2011-07-08 NOTE — Discharge Instructions (Signed)
Is clear that your asthma is triggered and exacerbated by allergens. Have recommended that you've start taking consistently an antihistamine along with the nasal steroid as prescribed. We have refilled most of your medicines and have encouraged you to also stay well hydrated and watch her face frequently especially when you arrive home. If any shortness of breath or worsening symptoms should go immediately to the emergency department for further management    Asthma, Adult Asthma is a disease of the lungs and can make it hard to breathe. Asthma cannot be cured, but medicine can help control it. Asthma may be started (triggered) by:  Pollen.   Dust.   Animal skin flakes (dander).   Molds.   Foods.   Respiratory infections (colds, flu).   Smoke.   Exercise.   Stress.   Other things that cause allergic reactions or allergies (allergens).  HOME CARE   Talk to your doctor about how to manage your attacks at home. This may include:   Using a tool called a peak flow meter.   Having medicine ready to stop the attack.   Take all medicine as told by your doctor.   Wash bed sheets and blankets every week in hot water and put them in the dryer.   Drink enough fluids to keep your pee (urine) clear or pale yellow.   Always be ready to get emergency help. Write down the phone number for your doctor. Keep it where you can easily find it.   Talk about exercise routines with your doctor.   If animal dander is causing your asthma, you may need to find a new home for your pet(s).  GET HELP RIGHT AWAY IF:   You have muscle aches.   You cough more.   You have chest pain.   You have thick spit (sputum) that changes to yellow, green, gray, or bloody.   Medicine does not stop your wheezing.   You have problems breathing.   You have a fever.   Your medicine causes:   A rash.   Itching.   Puffiness (swelling).   Breathing problems.  MAKE SURE YOU:   Understand these  instructions.   Will watch your condition.   Will get help right away if you are not doing well or get worse.  Document Released: 08/31/2007 Document Revised: 03/03/2011 Document Reviewed: 01/23/2008 [facility] Patient Information 2012 [place], [place].

## 2011-07-08 NOTE — ED Notes (Signed)
Pt c/o hoarseness, wheezing and feeling like chest is tight/hard to get good deep breath.  States she ran out of allergy med yesterday and this started this am.  Denies fever.  States she is out of albuterol neb. Solution.

## 2011-07-08 NOTE — ED Notes (Signed)
Pt states she feels much better since treatment, states chest doesn't feel tight. no longer hoarse,

## 2011-09-15 ENCOUNTER — Encounter (HOSPITAL_COMMUNITY): Payer: Self-pay | Admitting: Emergency Medicine

## 2011-09-15 ENCOUNTER — Emergency Department (HOSPITAL_COMMUNITY): Payer: Self-pay

## 2011-09-15 ENCOUNTER — Emergency Department (HOSPITAL_COMMUNITY)
Admission: EM | Admit: 2011-09-15 | Discharge: 2011-09-16 | Disposition: A | Discharge status: Home / Self Care | Payer: Self-pay | Attending: Emergency Medicine | Admitting: Emergency Medicine

## 2011-09-15 DIAGNOSIS — J45901 Unspecified asthma with (acute) exacerbation: Secondary | ICD-10-CM | POA: Insufficient documentation

## 2011-09-15 DIAGNOSIS — J45909 Unspecified asthma, uncomplicated: Secondary | ICD-10-CM

## 2011-09-15 DIAGNOSIS — Z825 Family history of asthma and other chronic lower respiratory diseases: Secondary | ICD-10-CM | POA: Insufficient documentation

## 2011-09-15 DIAGNOSIS — Z803 Family history of malignant neoplasm of breast: Secondary | ICD-10-CM | POA: Insufficient documentation

## 2011-09-15 DIAGNOSIS — Z87891 Personal history of nicotine dependence: Secondary | ICD-10-CM | POA: Insufficient documentation

## 2011-09-15 DIAGNOSIS — Z833 Family history of diabetes mellitus: Secondary | ICD-10-CM | POA: Insufficient documentation

## 2011-09-15 MED ORDER — PREDNISONE 20 MG PO TABS
60.0000 mg | ORAL_TABLET | Freq: Once | ORAL | Status: AC
  Administered 2011-09-15: 60 mg via ORAL
  Filled 2011-09-15: qty 3

## 2011-09-15 MED ORDER — ALBUTEROL SULFATE (5 MG/ML) 0.5% IN NEBU
5.0000 mg | INHALATION_SOLUTION | Freq: Once | RESPIRATORY_TRACT | Status: AC
  Administered 2011-09-15: 5 mg via RESPIRATORY_TRACT
  Filled 2011-09-15: qty 1

## 2011-09-15 MED ORDER — ALBUTEROL (5 MG/ML) CONTINUOUS INHALATION SOLN
INHALATION_SOLUTION | RESPIRATORY_TRACT | Status: AC
  Administered 2011-09-15: 5 mg
  Filled 2011-09-15: qty 40

## 2011-09-15 MED ORDER — ALBUTEROL SULFATE HFA 108 (90 BASE) MCG/ACT IN AERS
2.0000 | INHALATION_SPRAY | Freq: Four times a day (QID) | RESPIRATORY_TRACT | Status: DC
  Administered 2011-09-16: 2 via RESPIRATORY_TRACT
  Filled 2011-09-15: qty 6.7

## 2011-09-15 MED ORDER — IPRATROPIUM BROMIDE 0.02 % IN SOLN
0.5000 mg | Freq: Once | RESPIRATORY_TRACT | Status: AC
  Administered 2011-09-15: 0.5 mg via RESPIRATORY_TRACT
  Filled 2011-09-15: qty 2.5

## 2011-09-15 MED ORDER — ALBUTEROL SULFATE (5 MG/ML) 0.5% IN NEBU
5.0000 mg | INHALATION_SOLUTION | Freq: Once | RESPIRATORY_TRACT | Status: AC
  Administered 2011-09-15: 5 mg via RESPIRATORY_TRACT

## 2011-09-15 NOTE — ED Provider Notes (Signed)
History     CSN: [id number]  Arrival date & time 09/15/11  2013   First MD Initiated Contact with Patient 09/15/11 2256      Chief Complaint  Patient presents with  . Asthma    (Consider location/radiation/quality/duration/timing/severity/associated sxs/prior treatment) HPI Comments: Patient with a history of asthma presents emergency department with chief complaint of asthma exacerbation area she states she ran out of her albuterol inhaler today and she did not have a PCP to followup with for placement.  Patient reports chest tightness and shortness of breath began about 6 a.m. this morning and an occasional productive cough.  Patient denies any fevers, night sweats, chills, difficulty breathing, feeling of throat closing, sore throat, CP, or palpitations.   Patient is a 26 y.o. female presenting with asthma. The history is provided by the patient.  Asthma Associated symptoms include coughing. Pertinent negatives include no abdominal pain, chest pain, chills, diaphoresis, fever, headaches, rash or sore throat.    Past Medical History  Diagnosis Date  . Asthma     seasonal  . Shortness of breath   . Tobacco abuse   . Seasonal allergies     History reviewed. No pertinent past surgical history.  Family History  Problem Relation Age of Onset  . Cancer Mother     breast  . Asthma Mother   . Asthma Father   . Diabetes type II Paternal Aunt     History  Substance Use Topics  . Smoking status: Former Games developer  . Smokeless tobacco: Not on file  . Alcohol Use: Yes    OB History    Grav Para Term Preterm Abortions TAB SAB Ect Mult Living                  Review of Systems  Constitutional: Negative for fever, chills and diaphoresis.  HENT: Negative for sore throat, rhinorrhea, drooling, trouble swallowing, neck stiffness, voice change and sinus pressure.   Eyes: Negative for visual disturbance.  Respiratory: Positive for cough, chest tightness, shortness of breath and  wheezing. Negative for choking and stridor.   Cardiovascular: Negative for chest pain and palpitations.  Gastrointestinal: Negative for abdominal pain.  Genitourinary: Negative for dysuria.  Musculoskeletal: Negative for back pain and gait problem.  Skin: Negative for rash.  Neurological: Negative for syncope, speech difficulty, light-headedness and headaches.  Psychiatric/Behavioral: Negative for confusion.  All other systems reviewed and are negative.    Allergies  Review of patient's allergies indicates no known allergies.  Home Medications   Current Outpatient Rx  Name Route Sig Dispense Refill  . ALBUTEROL SULFATE HFA 108 (90 BASE) MCG/ACT IN AERS Inhalation Inhale 2 puffs into the lungs every 6 (six) hours as needed. 2 Inhaler 0  . DIPHENHYDRAMINE HCL 25 MG PO TABS Oral Take 25 mg by mouth every 6 (six) hours as needed. For allergies    . PROPYLHEXEDRINE NA INHA Nasal Place into the nose 2 (two) times daily.      BP 125/65  Pulse 79  Temp 99.1 F (37.3 C) (Oral)  Resp 18  SpO2 99%  LMP 09/13/2011  Physical Exam  Constitutional: She is oriented to person, place, and time. She appears well-developed and well-nourished. No distress.  HENT:  Head: Normocephalic and atraumatic.       Uvula midline, oropharynx clear and moist  Eyes: Conjunctivae and EOM are normal. Pupils are equal, round, and reactive to light.  Neck: Normal range of motion.       No  stridor.  Neck supple with full range of motion.  Cardiovascular: Normal rate, regular rhythm, normal heart sounds and intact distal pulses.   Pulmonary/Chest: Effort normal. She has wheezes (expiratory). She exhibits tenderness.       Patient not in respiratory distress, no accessory muscle use, nasal flaring.  Patient able to speak in full sentences.  Airway intact.  Lung auscultation positive for bilateral expiratory wheezing  Abdominal: Soft. There is no tenderness.  Musculoskeletal: Normal range of motion. She exhibits  no edema and no tenderness.  Neurological: She is alert and oriented to person, place, and time.  Skin: Skin is warm and dry. No rash noted. She is not diaphoretic.  Psychiatric: She has a normal mood and affect. Her behavior is normal.    ED Course  Procedures (including critical care time)  Labs Reviewed - No data to display Dg Chest 2 View  09/15/2011  *RADIOLOGY REPORT*  Clinical Data: Shortness of breath.  Cough.  Wheezing.  Asthma.  CHEST - 2 VIEW  Comparison:  02/26/2011  Findings:  The heart size and mediastinal contours are within normal limits.  Both lungs are clear.  The visualized skeletal structures are unremarkable.  IMPRESSION: No active cardiopulmonary disease.  Original Report Authenticated By: Danae Orleans, M.D.     No diagnosis found.    MDM  Asthma exacerbation   Patient ambulated in ED with O2 saturations maintained >90, no current signs of respiratory distress. Lung exam improved after hospital treatment. Pt states they are breathing at baseline. Pt has been instructed to continue using prescribed medications and to speak with PCP about today's exacerbation.          Jaci Carrel, [name] 09/15/11 2352

## 2011-09-15 NOTE — ED Notes (Signed)
PT. REPORTS SOB WITH WHEEZING AND OCCASIONAL PRODUCTIVE COUGH , RAN OUT OF VENTOLIN MDI AT HOME TODAY .

## 2011-09-15 NOTE — Discharge Instructions (Signed)
Followup with your doctor in regards to your asthma exacerbation. Read the instructions below to learn more about factors that can trigger asthma. Use her albuterol inhaler as directed. Your more than welcome to return to the emergency department if you become short of breath, your albuterol inhaler did not relieve symptoms, you are having chest pain associated with difficulty breathing, or any symptoms that are concerning to you.    IDENTIFY AND CONTROL FACTORS THAT MAKE YOUR ASTHMA WORSE A number of common things can set off or make your asthma symptoms worse (asthma triggers). Keep track of your asthma symptoms for several weeks, detailing all the environmental and emotional factors that are linked with your asthma. When you have an asthma attack, go back to your asthma diary to see which factor, or combination of factors, might have contributed to it. Once you know what these factors are, you can take steps to control many of them.  Allergies: If you have allergies and asthma, it is important to take asthma prevention steps at home. Asthma attacks (worsening of asthma symptoms) can be triggered by allergies, which can cause temporary increased inflammation of your airways. Minimizing contact with the substance to which you are allergic will help prevent an asthma attack. Animal Dander:   Some people are allergic to the flakes of skin or dried saliva from animals with fur or feathers. Keep these pets out of your home.   If you can't keep a pet outdoors, keep the pet out of your bedroom and other sleeping areas at all times, and keep the door closed.   Remove carpets and furniture covered with cloth from your home. If that is not possible, keep the pet away from fabric-covered furniture and carpets.  Dust Mites:  Many people with asthma are allergic to dust mites. Dust mites are tiny bugs that are found in every home, in mattresses, pillows, carpets, fabric-covered furniture, bedcovers, clothes,  stuffed toys, fabric, and other fabric-covered items.   Cover your mattress in a special dust-proof cover.   Cover your pillow in a special dust-proof cover, or wash the pillow each week in hot water. Water must be hotter than 130 F to kill dust mites. Cold or warm water used with detergent and bleach can also be effective.   Wash the sheets and blankets on your bed each week in hot water.   Try not to sleep or lie on cloth-covered cushions.   Call ahead when traveling and ask for a smoke-free hotel room. Bring your own bedding and pillows, in case the hotel only supplies feather pillows and down comforters, which may contain dust mites and cause asthma symptoms.   Remove carpets from your bedroom and those laid on concrete, if you can.   Keep stuffed toys out of the bed, or wash the toys weekly in hot water or cooler water with detergent and bleach.  Cockroaches:  Many people with asthma are allergic to the droppings and remains of cockroaches.   Keep food and garbage in closed containers. Never leave food out.   Use poison baits, traps, powders, gels, or paste (for example, boric acid).   If a spray is used to kill cockroaches, stay out of the room until the odor goes away.  Indoor Mold:  Fix leaky faucets, pipes, or other sources of water that have mold around them.   Clean moldy surfaces with a cleaner that has bleach in it.  Pollen and Outdoor Mold:  When pollen or mold spore counts  are high, try to keep your windows closed.   Stay indoors with windows closed from late morning to afternoon, if you can. Pollen and some mold spore counts are highest at that time.   Ask your caregiver whether you need to take or increase anti-inflammatory medicine before your allergy season starts.  Irritants:   Tobacco smoke is an irritant. If you smoke, ask your caregiver how you can quit. Ask family members to quit smoking, too. Do not allow smoking in your home or car.   If possible, do  not use a wood-burning stove, kerosene heater, or fireplace. Minimize exposure to all sources of smoke, including incense, candles, fires, and fireworks.   Try to stay away from strong odors and sprays, such as perfume, talcum powder, hair spray, and paints.   Decrease humidity in your home and use an indoor air cleaning device. Reduce indoor humidity to below 60 percent. Dehumidifiers or central air conditioners can do this.   Try to have someone else vacuum for you once or twice a week, if you can. Stay out of rooms while they are being vacuumed and for a short while afterward.   If you vacuum, use a dust mask from a hardware store, a double-layered or microfilter vacuum cleaner bag, or a vacuum cleaner with a HEPA filter.   Sulfites in foods and beverages can be irritants. Do not drink beer or wine, or eat dried fruit, processed potatoes, or shrimp if they cause asthma symptoms.   Cold air can trigger an asthma attack. Cover your nose and mouth with a scarf on cold or windy days.   Several health conditions can make asthma more difficult to manage, including runny nose, sinus infections, reflux disease, psychological stress, and sleep apnea. Your caregiver will treat these conditions, as well.   Avoid close contact with people who have a cold or the flu, since your asthma symptoms may get worse if you catch the infection from them. Wash your hands thoroughly after touching items that may have been handled by people with a respiratory infection.   Get a flu shot every year to protect against the flu virus, which often makes asthma worse for days or weeks. Also get a pneumonia shot once every five to 10 years.  Drugs:  Aspirin and other painkillers can cause asthma attacks. 10% to 20% of people with asthma have sensitivity to aspirin or a group of painkillers called non-steroidal anti-inflammatory drugs (NSAIDS), such as ibuprofen and naproxen. These drugs are used to treat pain and reduce  fevers. Asthma attacks caused by any of these medicines can be severe and even fatal. These drugs must be avoided in people who have known aspirin sensitive asthma. Products with acetaminophen are considered safe for people who have asthma. It is important that people with aspirin sensitivity read labels of all over-the-counter drugs used to treat pain, colds, coughs, and fever.   Beta blockers and ACE inhibitors are other drugs which you should discuss with your caregiver, in relation to your asthma.   EXERCISE  If you have exercise-induced asthma, or are planning vigorous exercise, or exercise in cold, humid, or dry environments, prevent exercise-induced asthma by following your caregiver's advice regarding asthma treatment before exercising.  RESOURCE GUIDE  Chronic Pain Problems: Contact [facility]  [phone] Patients need to be referred by their primary care doctor.  Insufficient Money for Medicine: Contact United Way:  call "211" or [facility] ([phone].  No Primary Care Doctor: -  Call [facility]  [phone] - can help you locate a primary care doctor that  accepts your insurance, provides certain services, etc. - Physician Referral Service- [phone]  Agencies that provide inexpensive medical care: - [facility]  [phone] - [facility]  [phone] - [facility]  [phone] - Women's Clinic  [phone] - Planned Parenthood  [phone] [facility]  [phone]  Medicaid-accepting [facility] Providers: - [facility]- [address]  [phone], Mon-Fri 9am-7pm, Sat 9am-1pm - [facility]- [address] [place], [address] [place]  [phone] - [facility]- [address] [place]  [phone] [facility]- [address]  [phone] - Renaye Rakers- [address] [place], [address], [phone]  Only accepts  [place] patients after they have their name  applied to their card  Self Pay (no insurance) in [place]: - Sickle Cell Patients: Dr Willey Blade, [facility]  [address] [place], [phone] - [facility]- [address] [place]  [phone]       [facility] [place]- [address]       -     [facility]- see information above (Speak to [employer] if you do not have insurance)       -  [facility]- [address] [place], [phone]       -  [facility]- [address],  [phone]       -  [facility]- [address] [phone]       -  Dr Julio Sicks-  [address], [place], [phone]       -  [facility]- [address] [phone]       -  [facility]- [address] [phone], also [address] [phone]       -    [facility]- [address] [place], [phone], 1st & 3rd Saturday   every month, 10am-1pm  1) Find a Doctor and Pay Out of Pocket Although you won't have to find out who is covered by your insurance plan, it is a good idea to ask around and get recommendations. You will then need to call the office and see if the doctor you have chosen will accept you as a new patient and what types of options they offer for patients who are self-pay. Some doctors offer discounts or will set up payment plans for their patients who do not have insurance, but you will need to ask so you aren't surprised when you get to your appointment.  2) Contact Your Local Health Department Not all health departments have doctors that can see patients for sick visits, but many do, so it is worth a call to see if yours does. If you don't know where your local health department is, you can check in your phone book. The CDC also has a tool to help you locate your state's health department, and many state websites also have listings of all of their  local health departments.  3) Find a Walk-in Clinic If your illness is not likely to be very severe or complicated, you may want to try a walk in clinic. These are popping up all over the country in pharmacies,  drugstores, and shopping centers. They're usually staffed by nurse practitioners or physician assistants that have been trained to treat common illnesses and complaints. They're usually fairly quick and inexpensive. However, if you have serious medical issues or chronic medical problems, these are probably not your best option  STD Testing - [facility] Department of [facility], STD Clinic, [address], [place], phone [phone] or [phone].  Monday - Friday, call for an appointment. [facility] Department of [employer], STD Clinic, [address], [place], phone [phone] or [phone].  Monday - Friday, call for an appointment.  Abuse/Neglect: [facility] Child Abuse Hotline (778) 607-1119 [facility] Child Abuse Hotline (802)138-6961 (After Hours)  Emergency Shelter:  [facility] 4231730681  Maternity Homes: - [facility] at [facility] [place] [facility] (913) [phone] - [facility] [phone]  MRSA Hotline #:   (262) 786-9023  [facility] Resources  [facility] [place]  United Way [facility] [place] Dept. [address]                 [address]         [address] [place] [address]  Blondell Reveal Phone:  [phone]                                  Phone:  [phone]                   Phone:  [phone]  [facility] Mental Health, [phone] - [facility]857 [phone]       -     [facility] in [place], [address]                                  [phone], [facility] Child Abuse  Hotline 409-808-0675 or 878-555-2357 (After Hours)   Behavioral Health Services  Substance Abuse Resources: - [facility] and Drug Services  (503) 844-3450 - [facility] (408) [phone] - [facility] [place] [phone] Floydene Flock [phone] - Residential & Outpatient Substance Abuse Program  [phone]  Psychological Services: [facility]  [phone] Services  [phone] - [facility], [phone] [name]. [address], [place], ACCESS LINE: 208-614-9426 or 623-813-5753, [employer]  Dental Assistance  If unable to pay or uninsured, contact:  [facility] or [facility]. to become qualified for the adult dental clinic.  Patients with Medicaid: [facility] [phone] [address], [phone] [address], [phone]  If unable to pay, or uninsured, contact [facility] [phone]) or [facility] Department [phone] in [place], [phone] in [facility]) to become qualified for the adult dental clinic  Other Low-Cost Community Dental Services: - [facility]- [address] [place], [place], [place], [address], [phone], Ext.  123, 2nd and 4th Thursday of the month at 6:30am.  10 clients each day by appointment, can sometimes see walk-in patients if someone does not show for an appointment. [facility]- [address] [place], [place], [address], [phone] - [facility]- [address] [place], [place], [address], [phone] - [facility]- [phone] [facility] Health Department- [phone] [facility] Department- [phone]

## 2011-09-16 NOTE — ED Provider Notes (Signed)
Medical screening examination/treatment/procedure(s) were conducted as a shared visit with non-physician practitioner(s) and myself.  I personally evaluated the patient during the encounter   Carleene Cooper III, MD 09/16/11 1311

## 2011-10-17 ENCOUNTER — Emergency Department (HOSPITAL_COMMUNITY)
Admission: EM | Admit: 2011-10-17 | Discharge: 2011-10-17 | Disposition: A | Discharge status: Home / Self Care | Payer: Self-pay | Attending: Emergency Medicine | Admitting: Emergency Medicine

## 2011-10-17 ENCOUNTER — Encounter (HOSPITAL_COMMUNITY): Payer: Self-pay | Admitting: *Deleted

## 2011-10-17 DIAGNOSIS — J45901 Unspecified asthma with (acute) exacerbation: Secondary | ICD-10-CM

## 2011-10-17 DIAGNOSIS — F17200 Nicotine dependence, unspecified, uncomplicated: Secondary | ICD-10-CM | POA: Insufficient documentation

## 2011-10-17 DIAGNOSIS — J45909 Unspecified asthma, uncomplicated: Secondary | ICD-10-CM | POA: Insufficient documentation

## 2011-10-17 MED ORDER — ALBUTEROL (5 MG/ML) CONTINUOUS INHALATION SOLN
15.0000 mg/h | INHALATION_SOLUTION | RESPIRATORY_TRACT | Status: AC
  Administered 2011-10-17: 15 mg/h via RESPIRATORY_TRACT

## 2011-10-17 MED ORDER — ALBUTEROL SULFATE (5 MG/ML) 0.5% IN NEBU: 2.5000 mg | INHALATION_SOLUTION | Freq: Once | RESPIRATORY_TRACT | Status: DC

## 2011-10-17 MED ORDER — PREDNISONE 20 MG PO TABS
60.0000 mg | ORAL_TABLET | Freq: Once | ORAL | Status: AC
  Administered 2011-10-17: 60 mg via ORAL
  Filled 2011-10-17: qty 3

## 2011-10-17 MED ORDER — METHYLPREDNISOLONE SODIUM SUCC 125 MG IJ SOLR
125.0000 mg | Freq: Once | INTRAMUSCULAR | Status: DC
  Filled 2011-10-17: qty 2

## 2011-10-17 MED ORDER — PREDNISONE 50 MG PO TABS: 50.0000 mg | ORAL_TABLET | Freq: Every day | ORAL | Status: DC

## 2011-10-17 MED ORDER — ALBUTEROL SULFATE HFA 108 (90 BASE) MCG/ACT IN AERS
2.0000 | INHALATION_SPRAY | RESPIRATORY_TRACT | Status: DC | PRN
  Administered 2011-10-17: 2 via RESPIRATORY_TRACT
  Filled 2011-10-17: qty 6.7

## 2011-10-17 MED ORDER — IPRATROPIUM BROMIDE 0.02 % IN SOLN: 0.5000 mg | Freq: Once | RESPIRATORY_TRACT | Status: DC

## 2011-10-17 MED ORDER — ALBUTEROL SULFATE (5 MG/ML) 0.5% IN NEBU: 5.0000 mg | INHALATION_SOLUTION | Freq: Once | RESPIRATORY_TRACT | Status: DC

## 2011-10-17 MED ORDER — IPRATROPIUM BROMIDE 0.02 % IN SOLN
1.0000 mg | Freq: Once | RESPIRATORY_TRACT | Status: AC
  Administered 2011-10-17: 1 mg via RESPIRATORY_TRACT
  Filled 2011-10-17: qty 2.5

## 2011-10-17 NOTE — ED Provider Notes (Signed)
History     CSN: [id number]  Arrival date & time 10/17/11  4098   First MD Initiated Contact with Patient 10/17/11 1003      No chief complaint on file.   (Consider location/radiation/quality/duration/timing/severity/associated sxs/prior treatment) HPI Patient presents emergency Dept. with wheezing and shortness of breath.  Patient has a history of asthma and she, states, that her inhaler had run out.  She states, that the symptoms started yesterday, but when she stepped outside to go to the urgent care she noted that her symptoms worsened.  Patient, states that she felt like her chest was tightening.  Patient denies, nausea, vomiting, fever, cough, weakness, abdominal pain, or dizziness.  Patient, states that she did not try anything for her symptoms prior to arrival since her inhaler was empty. Past Medical History  Diagnosis Date  . Asthma     seasonal  . Shortness of breath   . Tobacco abuse   . Seasonal allergies     No past surgical history on file.  Family History  Problem Relation Age of Onset  . Cancer Mother     breast  . Asthma Mother   . Asthma Father   . Diabetes type II Paternal Aunt     History  Substance Use Topics  . Smoking status: Former Games developer  . Smokeless tobacco: Not on file  . Alcohol Use: Yes    OB History    Grav Para Term Preterm Abortions TAB SAB Ect Mult Living                  Review of Systems  Constitutional: Negative for fever.  HENT: Negative for congestion, rhinorrhea, neck stiffness and postnasal drip.   Respiratory: Positive for chest tightness, shortness of breath and wheezing. Negative for apnea, cough, choking and stridor.   Cardiovascular: Negative for chest pain and palpitations.  Skin: Negative for color change.  Neurological: Negative for dizziness, syncope and light-headedness.   Allergies  Review of patient's allergies indicates no known allergies.  Home Medications   Current Outpatient Rx  Name Route Sig  Dispense Refill  . ALBUTEROL SULFATE HFA 108 (90 BASE) MCG/ACT IN AERS Inhalation Inhale 2 puffs into the lungs every 6 (six) hours as needed. 2 Inhaler 0  . DIPHENHYDRAMINE HCL 25 MG PO TABS Oral Take 25 mg by mouth every 6 (six) hours as needed. For allergies    . PROPYLHEXEDRINE NA INHA Nasal Place into the nose 2 (two) times daily.      BP 121/67  Pulse 99  Temp 98 F (36.7 C) (Oral)  Resp 18  SpO2 97%  Physical Exam  Nursing note and vitals reviewed. Constitutional: She is oriented to person, place, and time. She appears well-developed and well-nourished. She appears distressed.  HENT:  Head: Normocephalic and atraumatic.  Mouth/Throat: Oropharynx is clear and moist.  Eyes: Pupils are equal, round, and reactive to light.  Cardiovascular: Normal rate, regular rhythm and normal heart sounds.  Exam reveals no gallop and no friction rub.   No murmur heard. Pulmonary/Chest: No accessory muscle usage. Tachypnea noted. No apnea. She is in respiratory distress. She has no decreased breath sounds. She has wheezes in the right upper field, the right middle field, the right lower field, the left upper field, the left middle field and the left lower field. She has no rhonchi. She has no rales.  Neurological: She is alert and oriented to person, place, and time.  Skin: Skin is warm and dry. No rash  noted.    ED Course  Procedures (including critical care time)  10:05 AM.  Patient, started an hour-long treatment given Solu-Medrol.  We will recheck Patient in 20 minutes. 10:25 AM.  Patient seems to be slightly improved at this time 11:45 AM, recheck.  Patient, and she is feeling vastly better, and feels, that she can go home.  I advised the patient, she probably needs one more breathing treatment, and we can let her go home.  Patient is, advised to return here for any worsening in her condition.  Her condition is stabilized and vastly improved, and she will be advised to return for, recheck  with her primary care Dr. MDM          Carlyle Dolly, PA-C 10/17/11 1326

## 2011-10-17 NOTE — ED Notes (Signed)
Patient was breathing fine this am while in house getting ready for work, but upon walking outside, she immediately felt short of breath. She states she has asthma and allergies. Patient wheezing and having difficulty breathing while speaking. Sats 100% on 2L.

## 2011-10-25 ENCOUNTER — Encounter (HOSPITAL_COMMUNITY): Payer: Self-pay | Admitting: Emergency Medicine

## 2011-10-25 ENCOUNTER — Emergency Department (INDEPENDENT_AMBULATORY_CARE_PROVIDER_SITE_OTHER)
Admission: EM | Admit: 2011-10-25 | Discharge: 2011-10-25 | Disposition: A | Discharge status: Home / Self Care | Payer: Self-pay | Source: Home / Self Care | Attending: Emergency Medicine | Admitting: Emergency Medicine

## 2011-10-25 DIAGNOSIS — J45909 Unspecified asthma, uncomplicated: Secondary | ICD-10-CM

## 2011-10-25 MED ORDER — DEXAMETHASONE 4 MG PO TABS
16.0000 mg | ORAL_TABLET | Freq: Once | ORAL | Status: AC
  Administered 2011-10-25: 16 mg via ORAL

## 2011-10-25 MED ORDER — ALBUTEROL SULFATE HFA 108 (90 BASE) MCG/ACT IN AERS
INHALATION_SPRAY | RESPIRATORY_TRACT | Status: AC
  Filled 2011-10-25: qty 6.7

## 2011-10-25 MED ORDER — FLUTICASONE-SALMETEROL 100-50 MCG/DOSE IN AEPB: 1.0000 | INHALATION_SPRAY | Freq: Two times a day (BID) | RESPIRATORY_TRACT | Status: DC

## 2011-10-25 MED ORDER — BUDESONIDE 180 MCG/ACT IN AEPB: 2.0000 | INHALATION_SPRAY | Freq: Two times a day (BID) | RESPIRATORY_TRACT | Status: DC

## 2011-10-25 MED ORDER — ALBUTEROL SULFATE (5 MG/ML) 0.5% IN NEBU
5.0000 mg | INHALATION_SOLUTION | Freq: Once | RESPIRATORY_TRACT | Status: AC
  Administered 2011-10-25: 5 mg via RESPIRATORY_TRACT

## 2011-10-25 MED ORDER — ALBUTEROL SULFATE HFA 108 (90 BASE) MCG/ACT IN AERS
2.0000 | INHALATION_SPRAY | RESPIRATORY_TRACT | Status: DC | PRN
  Administered 2011-10-25: 2 via RESPIRATORY_TRACT

## 2011-10-25 MED ORDER — IPRATROPIUM BROMIDE 0.02 % IN SOLN
0.5000 mg | Freq: Once | RESPIRATORY_TRACT | Status: AC
  Administered 2011-10-25: 0.5 mg via RESPIRATORY_TRACT

## 2011-10-25 MED ORDER — DEXAMETHASONE 4 MG PO TABS
ORAL_TABLET | ORAL | Status: AC
  Filled 2011-10-25: qty 4

## 2011-10-25 MED ORDER — DEXAMETHASONE 4 MG PO TABS: ORAL_TABLET | ORAL | Status: DC

## 2011-10-25 MED ORDER — ALBUTEROL SULFATE (5 MG/ML) 0.5% IN NEBU
INHALATION_SOLUTION | RESPIRATORY_TRACT | Status: AC
  Filled 2011-10-25: qty 1

## 2011-10-25 NOTE — ED Notes (Signed)
Speaking in complete sentences.  Patient reports being seen at [facility] ed last week, did not get scripts filled, unable to afford medicine

## 2011-10-25 NOTE — ED Notes (Signed)
Onset last night of wheezing and difficulty breathing.  Denies uri symptoms.  Denies fever. Audible raspy respirations.

## 2011-10-25 NOTE — ED Provider Notes (Signed)
History     CSN: [id number]  Arrival date & time 10/25/11  1234   First MD Initiated Contact with Patient 10/25/11 1330      Chief Complaint  Patient presents with  . Shortness of Breath    (Consider location/radiation/quality/duration/timing/severity/associated sxs/prior treatment) HPI Comments: Patient reports coughing, wheezing, shortness of breath starting last night. States this was triggered off by cold temperatures. She was seen in the ER on 7/22 for the same, thought to have an asthma exacerbation. She was given Solu-Medrol IM and hour-long treatment with significant improvement. Patient states that she was unable to afford the steroids. States that she has gone through the 60 metered-dose albuterol rescue inhaler that they gave her. She states she's using this 5-7 times a day. Called EMS last night, given a DuoNeb with significant improvement. Patient declined going to the ED at that time.  Denies nausea, vomiting, chest pain, fevers. She has not been on steroids in the past month. She was admitted once, which was last year for an asthma exacerbation. No intubations.   ROS as noted in HPI. All other ROS negative.   Patient is a 26 y.o. female presenting with shortness of breath.  Shortness of Breath  Associated symptoms include shortness of breath.    Past Medical History  Diagnosis Date  . Asthma     seasonal  . Shortness of breath   . Tobacco abuse   . Seasonal allergies     History reviewed. No pertinent past surgical history.  Family History  Problem Relation Age of Onset  . Cancer Mother     breast  . Asthma Mother   . Asthma Father   . Diabetes type II Paternal Aunt     History  Substance Use Topics  . Smoking status: Former Games developer  . Smokeless tobacco: Not on file  . Alcohol Use: Yes    OB History    Grav Para Term Preterm Abortions TAB SAB Ect Mult Living                  Review of Systems  Respiratory: Positive for shortness of breath.      Allergies  Review of patient's allergies indicates no known allergies.  Home Medications   Current Outpatient Rx  Name Route Sig Dispense Refill  . ALBUTEROL SULFATE HFA 108 (90 BASE) MCG/ACT IN AERS Inhalation Inhale 2 puffs into the lungs every 6 (six) hours as needed. 2 Inhaler 0  . DIPHENHYDRAMINE HCL 25 MG PO TABS Oral Take 25 mg by mouth every 6 (six) hours as needed. For allergies    . PREDNISONE 50 MG PO TABS Oral Take 1 tablet (50 mg total) by mouth daily. 5 tablet 0  . PROPYLHEXEDRINE NA INHA Nasal Place into the nose 2 (two) times daily.      BP 130/77  Pulse 87  Temp 99.3 F (37.4 C) (Oral)  Resp 18  SpO2 100%  LMP 10/18/2011  Physical Exam  Nursing note and vitals reviewed. Constitutional: She is oriented to person, place, and time. She appears well-developed and well-nourished. No distress.  HENT:  Head: Normocephalic and atraumatic.  Eyes: Conjunctivae and EOM are normal.  Neck: Normal range of motion.  Cardiovascular: Normal rate.   Pulmonary/Chest: Effort normal. No respiratory distress. She has wheezes. She has no rales. She exhibits no tenderness.       Poor air entry, prolonged expiratory phase. speaking in full sentences.  Abdominal: She exhibits no distension.  Musculoskeletal: Normal range  of motion. She exhibits no edema.  Neurological: She is alert and oriented to person, place, and time. Coordination normal.  Skin: Skin is warm and dry.  Psychiatric: She has a normal mood and affect. Her behavior is normal. Judgment and thought content normal.    ED Course  Procedures (including critical care time)  Labs Reviewed - No data to display No results found.   No diagnosis found.    MDM  Previous records reviewed. As noted in history of present illness.   Pt seen and examined. Pt with an asthma exacerbation secondary to exposure to cold temperatures.  speaking in full sentences but has poor air movement,  wheezing throughout all lung  fields, Pt given albuterol/atrovent, dexamethasone for asthma exacerbation. Peak flow done. No focal lung findings, no fever, will not do CXR.  Will re-evaluate.  Initial peak flow 70    On re-evaluation, pt feels much improved after medication, pt comfortable. Improved air movement, faint wheezing on repeat physical exam. Posttreatment 250/230/260. Home with dexamethasone, giving her albuterol inhaler here. Sending home with inhaled steroids. Emphasized regular use of this. Discussed how to use peak flow meter and  symptoms that should prompt her return to the emergency department. Emphasized importance of f/u. Pt agrees.    Luiz Blare, MD 10/25/11 [phone]

## 2011-10-31 ENCOUNTER — Emergency Department (HOSPITAL_COMMUNITY)
Admission: EM | Admit: 2011-10-31 | Discharge: 2011-10-31 | Disposition: A | Discharge status: Home / Self Care | Payer: Self-pay | Attending: Emergency Medicine | Admitting: Emergency Medicine

## 2011-10-31 ENCOUNTER — Encounter (HOSPITAL_COMMUNITY): Payer: Self-pay | Admitting: Emergency Medicine

## 2011-10-31 ENCOUNTER — Emergency Department (HOSPITAL_COMMUNITY): Payer: Self-pay

## 2011-10-31 DIAGNOSIS — R0602 Shortness of breath: Secondary | ICD-10-CM | POA: Insufficient documentation

## 2011-10-31 DIAGNOSIS — F17200 Nicotine dependence, unspecified, uncomplicated: Secondary | ICD-10-CM | POA: Insufficient documentation

## 2011-10-31 DIAGNOSIS — Z79899 Other long term (current) drug therapy: Secondary | ICD-10-CM | POA: Insufficient documentation

## 2011-10-31 DIAGNOSIS — R05 Cough: Secondary | ICD-10-CM | POA: Insufficient documentation

## 2011-10-31 DIAGNOSIS — R079 Chest pain, unspecified: Secondary | ICD-10-CM | POA: Insufficient documentation

## 2011-10-31 DIAGNOSIS — R059 Cough, unspecified: Secondary | ICD-10-CM | POA: Insufficient documentation

## 2011-10-31 DIAGNOSIS — J45901 Unspecified asthma with (acute) exacerbation: Secondary | ICD-10-CM

## 2011-10-31 MED ORDER — IPRATROPIUM BROMIDE 0.02 % IN SOLN
0.5000 mg | Freq: Once | RESPIRATORY_TRACT | Status: AC
  Administered 2011-10-31: 0.5 mg via RESPIRATORY_TRACT
  Filled 2011-10-31: qty 2.5

## 2011-10-31 MED ORDER — ALBUTEROL SULFATE (5 MG/ML) 0.5% IN NEBU
2.5000 mg | INHALATION_SOLUTION | Freq: Once | RESPIRATORY_TRACT | Status: AC
  Administered 2011-10-31: 2.5 mg via RESPIRATORY_TRACT
  Filled 2011-10-31: qty 0.5

## 2011-10-31 MED ORDER — PREDNISONE 20 MG PO TABS
60.0000 mg | ORAL_TABLET | Freq: Once | ORAL | Status: AC
  Administered 2011-10-31: 60 mg via ORAL
  Filled 2011-10-31: qty 3

## 2011-10-31 MED ORDER — ALBUTEROL SULFATE (5 MG/ML) 0.5% IN NEBU
INHALATION_SOLUTION | RESPIRATORY_TRACT | Status: AC
  Administered 2011-10-31: 11:00:00
  Filled 2011-10-31: qty 1

## 2011-10-31 MED ORDER — PREDNISONE 10 MG PO TABS: 20.0000 mg | ORAL_TABLET | Freq: Every day | ORAL | Status: DC

## 2011-10-31 MED ORDER — ALBUTEROL SULFATE HFA 108 (90 BASE) MCG/ACT IN AERS
2.0000 | INHALATION_SPRAY | Freq: Four times a day (QID) | RESPIRATORY_TRACT | Status: DC
  Administered 2011-10-31: 2 via RESPIRATORY_TRACT
  Filled 2011-10-31: qty 6.7

## 2011-10-31 NOTE — ED Notes (Signed)
WUJ:WJ19<JY> Expected date:10/31/11<BR> Expected time: 9:51 AM<BR> Means of arrival:Ambulance<BR> Comments:<BR> 25yoF, SOB, hx of sthma

## 2011-10-31 NOTE — ED Notes (Addendum)
Pt here via EMS for SOB x2 days with a cold out of Inhaler.Given 1 albuterol treatment in route

## 2011-10-31 NOTE — ED Provider Notes (Signed)
History     CSN: [id number]  Arrival date & time 10/31/11  1002   First MD Initiated Contact with Patient 10/31/11 1008      Chief Complaint  Patient presents with  . Shortness of Breath    (Consider location/radiation/quality/duration/timing/severity/associated sxs/prior treatment) The history is provided by the patient and the EMS personnel.   patient with long-standing history of asthma called ambulance for significant shortness of breath ambulance gave her albuterol inhaler right ear with sats around 99% but still wheezing diffusely. Also with a history of congestion cough and upper respiratory infection no fever. Patient was seen several times in July for the same thing. Currently not taking prednisone and a put on in the past but never got it filled. Albuterol inhalers has run out  Past Medical History  Diagnosis Date  . Asthma     seasonal  . Shortness of breath   . Tobacco abuse   . Seasonal allergies     History reviewed. No pertinent past surgical history.  Family History  Problem Relation Age of Onset  . Cancer Mother     breast  . Asthma Mother   . Asthma Father   . Diabetes type II Paternal Aunt     History  Substance Use Topics  . Smoking status: Former Games developer  . Smokeless tobacco: Not on file  . Alcohol Use: Yes    OB History    Grav Para Term Preterm Abortions TAB SAB Ect Mult Living                  Review of Systems  Constitutional: Negative for fever.  HENT: Positive for congestion. Negative for sore throat and neck pain.   Respiratory: Positive for shortness of breath and wheezing.   Cardiovascular: Negative for chest pain.  Gastrointestinal: Negative for abdominal pain.  Genitourinary: Negative for dysuria.  Musculoskeletal: Negative for back pain.  Skin: Negative for rash.  Neurological: Negative for headaches.  Hematological: Does not bruise/bleed easily.    Allergies  Review of patient's allergies indicates no known  allergies.  Home Medications   Current Outpatient Rx  Name Route Sig Dispense Refill  . ALBUTEROL SULFATE HFA 108 (90 BASE) MCG/ACT IN AERS Inhalation Inhale 2 puffs into the lungs every 6 (six) hours as needed. 2 Inhaler 0  . PREDNISONE 10 MG PO TABS Oral Take 2 tablets (20 mg total) by mouth daily. 10 tablet 0    BP 125/86  Pulse 108  Temp 98.8 F (37.1 C) (Oral)  Resp 17  SpO2 94%  LMP 10/18/2011  Physical Exam  Nursing note and vitals reviewed. Constitutional: She is oriented to person, place, and time. She appears well-developed and well-nourished. She appears distressed.  HENT:  Head: Normocephalic and atraumatic.  Eyes: Conjunctivae are normal. Pupils are equal, round, and reactive to light.  Neck: Normal range of motion.  Cardiovascular: Normal rate, regular rhythm and normal heart sounds.   No murmur heard. Pulmonary/Chest: Effort normal. She has wheezes. She has no rales.  Abdominal: Soft. Bowel sounds are normal. There is no tenderness.  Musculoskeletal: Normal range of motion.  Neurological: She is alert and oriented to person, place, and time. No cranial nerve deficit. She exhibits normal muscle tone.  Skin: Skin is warm. No rash noted. Pallor: he.    ED Course  Procedures (including critical care time)  Labs Reviewed - No data to display Dg Chest 2 View  10/31/2011  *RADIOLOGY REPORT*  Clinical Data: Short of breath.  Chest pain.  Cough and congestion. History of asthma and bronchitis.  CHEST - 2 VIEW  Comparison: 09/15/2011.  Findings: Hyperinflation is present compatible with history of asthma.  No airspace disease.  No effusion.  The cardiopericardial silhouette appears within normal limits. Monitoring leads are projected over the chest.  Prominent vessels on end are present in the right upper lobe.  Bones appear within normal limits.  IMPRESSION: Hyperinflation of the lungs compatible with asthma.  Original Report Authenticated By: Andreas Newport, M.D.     Date: 10/31/2011  Rate: 112  Rhythm: normal sinus rhythm and sinus tachycardia  QRS Axis: normal  Intervals: normal  ST/T Wave abnormalities: normal  Conduction Disutrbances:none  Narrative Interpretation:   Old EKG Reviewed: none available    1. Asthma attack       MDM   Patient presented with wheezing bilaterally significant asthma attack has a history of this before seen several times in July. Patient was given albuterol by the ambulance in route here she received albuterol Atrovent x2 and albuterol x1 as well as 60 mg of prednisone wheezing is now resolved patient feels better chest x-ray was done to rule out pneumonia she's had a history of an upper respiratory infection no evidence of pneumonia. Will be sent home with albuterol inhaler given to her here in the emergency department as well as a prescription for prednisone 40 mg a day for the next 5 days. Patient understands the importance of getting the prednisone prescription filled and taking it.        Shelda Jakes, MD 10/31/11 [phone]

## 2011-10-31 NOTE — ED Notes (Signed)
Placed fall risk bracelet and red socks on pt.  

## 2011-11-05 NOTE — ED Provider Notes (Signed)
Medical screening examination/treatment/procedure(s) were performed by non-physician practitioner and as supervising physician I was immediately available for consultation/collaboration.   Janoah Menna E Inge Waldroup, MD 11/05/11 0719 

## 2012-02-24 ENCOUNTER — Emergency Department (HOSPITAL_COMMUNITY)
Admission: EM | Admit: 2012-02-24 | Discharge: 2012-02-24 | Disposition: A | Discharge status: Home / Self Care | Payer: Self-pay | Attending: Emergency Medicine | Admitting: Emergency Medicine

## 2012-02-24 ENCOUNTER — Encounter (HOSPITAL_COMMUNITY): Payer: Self-pay | Admitting: Emergency Medicine

## 2012-02-24 DIAGNOSIS — Z79899 Other long term (current) drug therapy: Secondary | ICD-10-CM | POA: Insufficient documentation

## 2012-02-24 DIAGNOSIS — J309 Allergic rhinitis, unspecified: Secondary | ICD-10-CM | POA: Insufficient documentation

## 2012-02-24 DIAGNOSIS — Z87891 Personal history of nicotine dependence: Secondary | ICD-10-CM | POA: Insufficient documentation

## 2012-02-24 DIAGNOSIS — J45901 Unspecified asthma with (acute) exacerbation: Secondary | ICD-10-CM | POA: Insufficient documentation

## 2012-02-24 MED ORDER — PREDNISONE 20 MG PO TABS: 40.0000 mg | ORAL_TABLET | Freq: Every day | ORAL | Status: DC

## 2012-02-24 MED ORDER — PREDNISONE 20 MG PO TABS
40.0000 mg | ORAL_TABLET | Freq: Once | ORAL | Status: AC
  Administered 2012-02-24: 40 mg via ORAL
  Filled 2012-02-24: qty 2

## 2012-02-24 MED ORDER — ALBUTEROL (5 MG/ML) CONTINUOUS INHALATION SOLN
10.0000 mg | INHALATION_SOLUTION | RESPIRATORY_TRACT | Status: AC
  Administered 2012-02-24: 10 mg via RESPIRATORY_TRACT
  Filled 2012-02-24: qty 20

## 2012-02-24 MED ORDER — ALBUTEROL SULFATE HFA 108 (90 BASE) MCG/ACT IN AERS
2.0000 | INHALATION_SPRAY | Freq: Once | RESPIRATORY_TRACT | Status: AC
  Administered 2012-02-24: 2 via RESPIRATORY_TRACT
  Filled 2012-02-24: qty 6.7

## 2012-02-24 MED ORDER — IPRATROPIUM BROMIDE 0.02 % IN SOLN
0.5000 mg | RESPIRATORY_TRACT | Status: AC
  Administered 2012-02-24: 0.5 mg via RESPIRATORY_TRACT
  Filled 2012-02-24: qty 2.5

## 2012-02-24 NOTE — ED Notes (Signed)
MD at bedside. 

## 2012-02-24 NOTE — ED Provider Notes (Signed)
History     CSN: [id number]  Arrival date & time 02/24/12  4098   First MD Initiated Contact with Patient 02/24/12 0515      Chief Complaint  Patient presents with  . Asthma    (Consider location/radiation/quality/duration/timing/severity/associated sxs/prior treatment) HPI Comments: Pt is a 26 y/o female with a hx of Asthma and no other PMH who presents with SOB which started in last 12 hours - EMS was called out for SOB last night - gave neb and pt refused transport - but this AM she felt more SOB, called for transport - EMS found pt ambulatory in mild resp distress - gave neb of albuterol with some improvement.  She has no associated coughing, fevers, vomiting, swelling, back or chest pain.  She has no sore throat, ear pain, eye d/c or redness.  Sx are persistent.  She last used prednisone this summer and according to the medical record she has not been seen at ED here in several months.  She agrees she has done generally well over last quarter.  She has trouble with change in seasons and temperature changes.  Patient is a 26 y.o. female presenting with asthma. The history is provided by the patient, the EMS personnel and medical records.  Asthma    Past Medical History  Diagnosis Date  . Asthma     seasonal  . Shortness of breath   . Tobacco abuse   . Seasonal allergies     History reviewed. No pertinent past surgical history.  Family History  Problem Relation Age of Onset  . Cancer Mother     breast  . Asthma Mother   . Asthma Father   . Diabetes type II Paternal Aunt     History  Substance Use Topics  . Smoking status: Former Games developer  . Smokeless tobacco: Not on file  . Alcohol Use: Yes    OB History    Grav Para Term Preterm Abortions TAB SAB Ect Mult Living                  Review of Systems  All other systems reviewed and are negative.    Allergies  Review of patient's allergies indicates no known allergies.  Home Medications   Current  Outpatient Rx  Name  Route  Sig  Dispense  Refill  . ALBUTEROL SULFATE HFA 108 (90 BASE) MCG/ACT IN AERS   Inhalation   Inhale 2 puffs into the lungs every 6 (six) hours as needed.   2 Inhaler   0   . PREDNISONE 20 MG PO TABS   Oral   Take 2 tablets (40 mg total) by mouth daily.   10 tablet   0     BP 132/79  Pulse 93  Temp 97.9 F (36.6 C) (Oral)  Resp 18  SpO2 100%  Physical Exam  Nursing note and vitals reviewed. Constitutional: She appears well-developed and well-nourished. No distress.  HENT:  Head: Normocephalic and atraumatic.  Mouth/Throat: Oropharynx is clear and moist. No oropharyngeal exudate.  Eyes: Conjunctivae normal and EOM are normal. Pupils are equal, round, and reactive to light. Right eye exhibits no discharge. Left eye exhibits no discharge. No scleral icterus.  Neck: Normal range of motion. Neck supple. No JVD present. No thyromegaly present.  Cardiovascular: Normal rate, regular rhythm, normal heart sounds and intact distal pulses.  Exam reveals no gallop and no friction rub.   No murmur heard. Pulmonary/Chest: Effort normal. No respiratory distress. She has wheezes (increased  wheezing, slight prolonged expiratory phase, speaks in full sentences). She has no rales.  Abdominal: Soft. Bowel sounds are normal. She exhibits no distension and no mass. There is no tenderness.  Musculoskeletal: Normal range of motion. She exhibits no edema and no tenderness.  Lymphadenopathy:    She has no cervical adenopathy.  Neurological: She is alert. Coordination normal.  Skin: Skin is warm and dry. No rash noted. No erythema.  Psychiatric: She has a normal mood and affect. Her behavior is normal.    ED Course  Procedures (including critical care time)  Labs Reviewed - No data to display No results found.   1. Asthma attack       MDM  Sat's are normal, pt has no tachycardia, no fever and no signs of bacterial infection - she has wheezing on expiration and  will require further treatment with prednisone, albuterol and atrovent.  After repeat evaluation the patient has had a continuous nebulizer of albuterol, prednisone, appears much more at ease with breathing, minimal expiratory wheezing, speaking in full sentences, no distress, stable for discharge.      Vida Roller, MD 02/24/12 [phone]

## 2012-02-24 NOTE — Progress Notes (Signed)
Pt placed on hour long nebulizer with 10 mg albuterol and 0.5 mg atrovent.

## 2012-02-24 NOTE — ED Notes (Signed)
Bed:WA16<BR> Expected date:<BR> Expected time:<BR> Means of arrival:<BR> Comments:<BR> EMS

## 2012-02-24 NOTE — ED Notes (Signed)
Report given via EMS. Pt hx of asthma with recent flare ups. Pt had previous breathing treatment from EMS 10 hours prior. Pt received 5mg  of albuterol en route. Initial VS 118/70 Pulse 82 RR 22 O2 92% RA at 0500.

## 2012-03-07 ENCOUNTER — Observation Stay (HOSPITAL_COMMUNITY): Payer: Self-pay

## 2012-03-07 ENCOUNTER — Encounter (HOSPITAL_COMMUNITY): Payer: Self-pay | Admitting: Emergency Medicine

## 2012-03-07 ENCOUNTER — Observation Stay (HOSPITAL_COMMUNITY)
Admission: EM | Admit: 2012-03-07 | Discharge: 2012-03-07 | Disposition: A | Discharge status: Home / Self Care | Payer: Self-pay | Attending: Emergency Medicine | Admitting: Emergency Medicine

## 2012-03-07 DIAGNOSIS — J45901 Unspecified asthma with (acute) exacerbation: Principal | ICD-10-CM | POA: Insufficient documentation

## 2012-03-07 DIAGNOSIS — R0602 Shortness of breath: Secondary | ICD-10-CM | POA: Insufficient documentation

## 2012-03-07 DIAGNOSIS — F17200 Nicotine dependence, unspecified, uncomplicated: Secondary | ICD-10-CM | POA: Insufficient documentation

## 2012-03-07 DIAGNOSIS — R079 Chest pain, unspecified: Secondary | ICD-10-CM | POA: Insufficient documentation

## 2012-03-07 MED ORDER — MAGNESIUM SULFATE 40 MG/ML IJ SOLN
2.0000 g | Freq: Two times a day (BID) | INTRAMUSCULAR | Status: DC
  Administered 2012-03-07: 2 g via INTRAVENOUS
  Filled 2012-03-07 (×2): qty 50

## 2012-03-07 MED ORDER — ALBUTEROL (5 MG/ML) CONTINUOUS INHALATION SOLN
10.0000 mg/h | INHALATION_SOLUTION | Freq: Once | RESPIRATORY_TRACT | Status: AC
  Administered 2012-03-07: 10 mg/h via RESPIRATORY_TRACT
  Filled 2012-03-07: qty 20

## 2012-03-07 MED ORDER — ALBUTEROL SULFATE (5 MG/ML) 0.5% IN NEBU
5.0000 mg | INHALATION_SOLUTION | Freq: Once | RESPIRATORY_TRACT | Status: AC
  Administered 2012-03-07: 5 mg via RESPIRATORY_TRACT
  Filled 2012-03-07: qty 1

## 2012-03-07 MED ORDER — SODIUM CHLORIDE 0.9 % IV SOLN
Freq: Once | INTRAVENOUS | Status: AC
  Administered 2012-03-07: 10 mL/h via INTRAVENOUS

## 2012-03-07 MED ORDER — IBUPROFEN 800 MG PO TABS
800.0000 mg | ORAL_TABLET | Freq: Once | ORAL | Status: AC
  Administered 2012-03-07: 800 mg via ORAL
  Filled 2012-03-07: qty 1

## 2012-03-07 MED ORDER — ALBUTEROL SULFATE HFA 108 (90 BASE) MCG/ACT IN AERS
2.0000 | INHALATION_SPRAY | Freq: Once | RESPIRATORY_TRACT | Status: AC
  Administered 2012-03-07: 2 via RESPIRATORY_TRACT
  Filled 2012-03-07: qty 6.7

## 2012-03-07 MED ORDER — PREDNISONE 20 MG PO TABS
60.0000 mg | ORAL_TABLET | Freq: Once | ORAL | Status: AC
  Administered 2012-03-07: 60 mg via ORAL
  Filled 2012-03-07: qty 3

## 2012-03-07 MED ORDER — IPRATROPIUM BROMIDE 0.02 % IN SOLN
0.5000 mg | Freq: Once | RESPIRATORY_TRACT | Status: AC
  Administered 2012-03-07: 0.5 mg via RESPIRATORY_TRACT
  Filled 2012-03-07: qty 2.5

## 2012-03-07 MED ORDER — PREDNISONE 20 MG PO TABS
40.0000 mg | ORAL_TABLET | Freq: Every day | ORAL | Status: DC
Start: 2012-03-07 — End: 2012-03-08

## 2012-03-07 MED ORDER — ALBUTEROL SULFATE (5 MG/ML) 0.5% IN NEBU: 2.5000 mg | INHALATION_SOLUTION | RESPIRATORY_TRACT | Status: DC | PRN

## 2012-03-07 MED ORDER — MAGNESIUM SULFATE 40 MG/ML IJ SOLN
2.0000 g | Freq: Once | INTRAMUSCULAR | Status: AC
  Administered 2012-03-07: 2 g via INTRAVENOUS

## 2012-03-07 NOTE — Progress Notes (Signed)
Utilization review completed.  P.J. Dayna Geurts,RN,BSN Case Manager [phone]  

## 2012-03-07 NOTE — ED Provider Notes (Signed)
Medical screening examination/treatment/procedure(s) were conducted as a shared visit with non-physician practitioner(s) and myself.  I personally evaluated the patient during the encounter 26 yo woman with episodes of asthma has run out of her inhaler and has returned wheezing.  Lungs show bilateral wheezes.  Advised admission to CDU for bronchospasm protocol.   Carleene Cooper III, MD 03/07/12 [phone]

## 2012-03-07 NOTE — ED Provider Notes (Signed)
Patient placed in CDU by Carleene Cooper MD for wheeze protocol. Patient care resumed from [facility] .  Patient is here for asthma exacerbation and has received albuterol treatments and prednisone.   Plan per previous provider is to help with PCP follow up, and d/c with rx for albuterol and prednisone after resolution of wheezing.  Patient re-evaluated and is resting comfortably, VSS, with no new complaints or concerns at this time.  On exam: hemodynamically stable, NAD, heart w/ RRR, persistent wheezing and accessory muscle use throughout, Chest & abd non-tender, no peripheral edema or calf tenderness.  BP 115/62  Pulse 101  Temp 98.5 F (36.9 C) (Oral)  Resp 22  Wt 150 lb (68.04 kg)  SpO2 95%  LMP 03/06/2012  Discussed with patient current lab and imaging results as well as their care plan, patient questions answered.  Patient is amenable to the plan.  Patient able to stand and with peak flow of 250.  6:46 PM  Pt re-evaluated: Patient with persisting inspiratory and expiratory wheezing throughout lung fields as well as accessory muscle use. Will redose continuous neb for one hour and reevaluate.   7:42 PM Mag 2g IV ordered as well.    10:31 PM Patient significantly improved after second albuterol treatment and magnesium sulfate. She relates without difficulty, NAD, nontoxic, nonseptic appearing. On reevaluation her lungs are clear and equal to auscultation. She states she feels much better and is ready to go home.  We'll DC home with albuterol medications, nebulizer, prescription for nebulizer machine and prednisone taper.  I have also discussed reasons to return immediately to the ER.  Patient expresses understanding and agrees with plan.   1. Medications: Albuterol MDI, albuterol nebulizer, prednisone taper, usual home medications  2. Treatment: rest, drink plenty of fluids, take medications as prescribed  3. Follow Up: Please followup with your primary doctor for discussion of  your diagnoses and further evaluation after today's visit; if you do not have a primary care doctor use the resource guide provided to find one   Dierdre Forth, PA-C 03/07/12 2232

## 2012-03-07 NOTE — ED Provider Notes (Signed)
Medical screening examination/treatment/procedure(s) were conducted as a shared visit with non-physician practitioner(s) and myself.  I personally evaluated the patient during the encounter See my prior note--26 yo woman with episodes of asthma has run out of her inhaler and has returned wheezing.  Lungs show bilateral wheezes.  Advised admission to CDU for bronchospasm protocol.   Carleene Cooper III, MD 03/07/12 2051

## 2012-03-07 NOTE — ED Notes (Signed)
Pt stated " I want to go home" Notified Hannah,PA

## 2012-03-07 NOTE — ED Notes (Signed)
Patient transported to X-ray 

## 2012-03-07 NOTE — Progress Notes (Signed)
26 yo woman with recurring bouts of asthma.  She has run out of her inhaler.  Exam shows bilateral wheezing.  Rx with albuterol nebulizer nebs, sterods, magnesium --> to CDU on bronchospasm protocol.

## 2012-03-07 NOTE — ED Notes (Signed)
Pt reports asthma symptoms beginning last night before bed. This AM woke with labored breathing, wheezing and states she ran out of her medication.

## 2012-03-07 NOTE — ED Notes (Signed)
Pt. Attempted to get oob , became dizzy.  Denied any pain at that time.  Assisted pt. Back into bed and reported to [place], [place]

## 2012-03-07 NOTE — ED Provider Notes (Signed)
Is a 26 year old female, who presents emergency department with chief complaint of wheezing. Patient was initially seen by Trixie Dredge, PA-C, supervised by Dr. Ignacia Palma, in pod a. Patient moved to CDU and placed on wheeze protocol. I evaluated the patient. She states that she is feeling a little better after the breathing treatment she received in the emergency department. We'll continue treating the patient's wheezing per protocol.  Patient still complains of wheezing.  PE: Gen: A&O x4 HEENT: PERRL, EOM intact CHEST: RRR, no m/r/g LUNGS: Wheezing heard mostly in the lower left lung field, though some diffuse wheezes still heard throughout. ABD: BS x 4, ND/NT EXT: No edema, strong peripheral pulses NEURO: Sensation and strength intact bilaterally   Plan: Wheeze protocol.  I have signed the patient out to [facility], PA-C who will continue care.  Roxy Horseman, PA-C 03/07/12 1549

## 2012-03-07 NOTE — ED Provider Notes (Signed)
History     CSN: [id number]  Arrival date & time 03/07/12  7829   First MD Initiated Contact with Patient 03/07/12 [phone]      Chief Complaint  Patient presents with  . Shortness of Breath    (Consider location/radiation/quality/duration/timing/severity/associated sxs/prior treatment) HPI Comments: Pt with hx asthma, out of her albuterol inhaler x 6 days, presents with gradual onset SOB, cough, wheezing that began yesterday.  Used coworker's inhaler with some relief.  This morning she awake with tightness in her chest and sharp pain with deep inhalation.  Denies fevers, chills, body aches, upper respiratory symptoms.  Has recent sick contacts with cough.  No personal or family hx blood clots, no recent immobilization, no leg swelling.    The history is provided by the patient.    Past Medical History  Diagnosis Date  . Asthma     seasonal  . Shortness of breath   . Tobacco abuse   . Seasonal allergies     No past surgical history on file.  Family History  Problem Relation Age of Onset  . Cancer Mother     breast  . Asthma Mother   . Asthma Father   . Diabetes type II Paternal Aunt     History  Substance Use Topics  . Smoking status: Former Games developer  . Smokeless tobacco: Not on file  . Alcohol Use: Yes    OB History    Grav Para Term Preterm Abortions TAB SAB Ect Mult Living                  Review of Systems  Constitutional: Negative for fever and chills.  HENT: Negative for ear pain, congestion, sore throat and rhinorrhea.   Respiratory: Positive for cough, chest tightness and shortness of breath.   Musculoskeletal: Negative for myalgias.    Allergies  Review of patient's allergies indicates no known allergies.  Home Medications   Current Outpatient Rx  Name  Route  Sig  Dispense  Refill  . ALBUTEROL SULFATE HFA 108 (90 BASE) MCG/ACT IN AERS   Inhalation   Inhale 2 puffs into the lungs every 6 (six) hours as needed.   2 Inhaler   0     BP  131/81  Pulse 99  Temp 98.5 F (36.9 C) (Oral)  Resp 22  Wt 150 lb (68.04 kg)  SpO2 95%  LMP 03/06/2012  Physical Exam  Nursing note and vitals reviewed. Constitutional: She appears well-developed and well-nourished. No distress.  HENT:  Head: Normocephalic and atraumatic.  Neck: Neck supple.  Cardiovascular: Normal rate and regular rhythm.   Pulmonary/Chest: Effort normal. No respiratory distress. She has wheezes. She has no rhonchi. She has no rales.       Diffuse inspiratory and expiratory wheezes.  No rales or ronchi.    Neurological: She is alert.  Skin: She is not diaphoretic.    ED Course  Procedures (including critical care time)  Labs Reviewed - No data to display No results found.  9:54 AM After first neb treatment, pt denies any improvement.  However, wheezing is much improved on exam and pt is moving air in all fields.   12:15 PM Pt with continued inspiratory and expiratory wheezing on exam.  Moving air well in all fields.  Will move pt to CDU for bronchospasm protocol.  Discussed with Dr Ignacia Palma.   1. Asthma exacerbation      MDM  Pt with typical asthma exacerbation showing some improvement following neb treatments.  Has been given prednisone.  Pt started on bronchospasm protocol, moved to CDU.  Ivar Drape, PA-C, to assume care of patient upon her arrival.  Pt will need help with PCP follow up, will need rx for albuterol and prednisone upon discharge.          [place], [place] 03/07/12 1230

## 2012-03-07 NOTE — ED Notes (Signed)
Respiratory at bedside.

## 2012-03-08 ENCOUNTER — Encounter (HOSPITAL_COMMUNITY): Payer: Self-pay | Admitting: *Deleted

## 2012-03-08 ENCOUNTER — Emergency Department (HOSPITAL_COMMUNITY)
Admission: EM | Admit: 2012-03-08 | Discharge: 2012-03-09 | Disposition: A | Discharge status: Home / Self Care | Payer: Self-pay | Attending: Emergency Medicine | Admitting: Emergency Medicine

## 2012-03-08 DIAGNOSIS — Z79899 Other long term (current) drug therapy: Secondary | ICD-10-CM | POA: Insufficient documentation

## 2012-03-08 DIAGNOSIS — Z87891 Personal history of nicotine dependence: Secondary | ICD-10-CM | POA: Insufficient documentation

## 2012-03-08 DIAGNOSIS — R509 Fever, unspecified: Secondary | ICD-10-CM | POA: Insufficient documentation

## 2012-03-08 DIAGNOSIS — J45901 Unspecified asthma with (acute) exacerbation: Secondary | ICD-10-CM | POA: Insufficient documentation

## 2012-03-08 DIAGNOSIS — J329 Chronic sinusitis, unspecified: Secondary | ICD-10-CM | POA: Insufficient documentation

## 2012-03-08 MED ORDER — ALBUTEROL SULFATE (5 MG/ML) 0.5% IN NEBU
5.0000 mg | INHALATION_SOLUTION | Freq: Once | RESPIRATORY_TRACT | Status: AC
  Administered 2012-03-08: 5 mg via RESPIRATORY_TRACT
  Filled 2012-03-08: qty 1

## 2012-03-08 MED ORDER — IPRATROPIUM BROMIDE 0.02 % IN SOLN
0.5000 mg | RESPIRATORY_TRACT | Status: AC
  Administered 2012-03-08: 0.5 mg via RESPIRATORY_TRACT
  Filled 2012-03-08: qty 2.5

## 2012-03-08 MED ORDER — PREDNISONE 20 MG PO TABS
40.0000 mg | ORAL_TABLET | Freq: Once | ORAL | Status: AC
  Administered 2012-03-08: 40 mg via ORAL
  Filled 2012-03-08: qty 2

## 2012-03-08 MED ORDER — AZITHROMYCIN 250 MG PO TABS
500.0000 mg | ORAL_TABLET | Freq: Once | ORAL | Status: AC
  Administered 2012-03-08: 500 mg via ORAL
  Filled 2012-03-08: qty 2

## 2012-03-08 NOTE — ED Notes (Signed)
Pt reports that was seen at [facility] ED on 12/11 for cough and SHOB; pt was diagnosed with Asthma Exac; pt states "they wanted to keep me but I didn't see the point"' pt reports continues Shob, cough and now has developed fever, chills, productive cough, sinus pressure.

## 2012-03-08 NOTE — ED Provider Notes (Signed)
Medical screening examination/treatment/procedure(s) were performed by non-physician practitioner and as supervising physician I was immediately available for consultation/collaboration.  Eduin Friedel, MD 03/08/12 0137 

## 2012-03-08 NOTE — ED Provider Notes (Addendum)
History     CSN: [id number]  Arrival date & time 03/08/12  2212   First MD Initiated Contact with Patient 03/08/12 2253      Chief Complaint  Patient presents with  . Asthma    (Consider location/radiation/quality/duration/timing/severity/associated sxs/prior treatment) HPI Comments: 26 year old female with a history of asthma who presents with recurrent wheezing. According to the medical record the patient was seen yesterday at [facility] and placed on an observational protocol for wheezing, received multiple albuterol treatments, prednisone and a chest x-ray which I reviewed and shows no signs of infiltrates. She states that over the course of the day today she has been using her albuterol inhaler, she has used this multiple times, she admits to using it more often than she should but has not had a recurrent dose of prednisone today. The albuterol treatments and seemed to help her feel better temporarily She admits to having some sinus pressure in her upper frontal sinuses but denies any cough, sore throat, nasal congestion, chest pain, back pain, abdominal pain, swelling, rash or any other complaints. She does admit to having fevers but no chills nausea or vomiting.  Patient is a 26 y.o. female presenting with asthma. The history is provided by the patient and medical records.  Asthma    Past Medical History  Diagnosis Date  . Asthma     seasonal  . Shortness of breath   . Tobacco abuse   . Seasonal allergies     History reviewed. No pertinent past surgical history.  Family History  Problem Relation Age of Onset  . Cancer Mother     breast  . Asthma Mother   . Asthma Father   . Diabetes type II Paternal Aunt     History  Substance Use Topics  . Smoking status: Former Games developer  . Smokeless tobacco: Not on file  . Alcohol Use: Yes    OB History    Grav Para Term Preterm Abortions TAB SAB Ect Mult Living                  Review of Systems  All other  systems reviewed and are negative.    Allergies  Review of patient's allergies indicates no known allergies.  Home Medications   Current Outpatient Rx  Name  Route  Sig  Dispense  Refill  . ALBUTEROL SULFATE HFA 108 (90 BASE) MCG/ACT IN AERS   Inhalation   Inhale 2 puffs into the lungs every 6 (six) hours as needed. For wheezing or shortness of breath         . ALBUTEROL SULFATE (5 MG/ML) 0.5% IN NEBU   Nebulization   Take 2.5 mg by nebulization every 4 (four) hours as needed. For wheezing or shortness of breath         . AZITHROMYCIN 250 MG PO TABS   Oral   Take 1 tablet (250 mg total) by mouth daily. 500mg  PO day 1, then 250mg  PO days 205   6 tablet   0   . PREDNISONE 20 MG PO TABS   Oral   Take 10-40 mg by mouth daily. Take 40 mg by mouth daily for 3 days, then 20mg  by mouth daily for 3 days, then 10mg  daily for 3 days         . PREDNISONE 20 MG PO TABS   Oral   Take 2 tablets (40 mg total) by mouth daily.   10 tablet   0  BP 120/78  Pulse 108  Temp 101.3 F (38.5 C)  Resp 18  Ht 5\' 2"  (1.575 m)  Wt 150 lb (68.04 kg)  BMI 27.44 kg/m2  SpO2 99%  LMP 03/06/2012  Physical Exam  Nursing note and vitals reviewed. Constitutional: She appears well-developed and well-nourished. No distress.  HENT:  Head: Normocephalic and atraumatic.  Mouth/Throat: Oropharynx is clear and moist. No oropharyngeal exudate.       Mild sinus tenderness, nares clear, OP clear, MMM, no exudate or asymetry of OP  Eyes: Conjunctivae normal and EOM are normal. Pupils are equal, round, and reactive to light. Right eye exhibits no discharge. Left eye exhibits no discharge. No scleral icterus.  Neck: Normal range of motion. Neck supple. No JVD present. No thyromegaly present.  Cardiovascular: Regular rhythm, normal heart sounds and intact distal pulses.  Exam reveals no gallop and no friction rub.   No murmur heard.      Mild tachycardia  Pulmonary/Chest: Effort normal. No  respiratory distress. She has wheezes. She has no rales.       No tachypnea, diffuse mild wheezing, no accessory muscle use, speaks in full sentences  Abdominal: Soft. Bowel sounds are normal. She exhibits no distension and no mass. There is no tenderness.  Musculoskeletal: Normal range of motion. She exhibits no edema and no tenderness.  Lymphadenopathy:    She has no cervical adenopathy.  Neurological: She is alert. Coordination normal.  Skin: Skin is warm and dry. No rash noted. No erythema.  Psychiatric: She has a normal mood and affect. Her behavior is normal.    ED Course  Procedures (including critical care time)  Labs Reviewed - No data to display Dg Chest 2 View  03/07/2012  *RADIOLOGY REPORT*  Clinical Data: Shortness of breath, asthma  CHEST - 2 VIEW  Comparison: 10/31/2011  Findings: Cardiomediastinal silhouette is stable.  No acute infiltrate or pleural effusion.  No pulmonary edema.  Bony thorax is stable. Hyperinflation again noted.  IMPRESSION: No active disease.  No significant change.   Original Report Authenticated By: Natasha Mead, M.D.    ED ECG REPORT  I personally interpreted this EKG   Date: 03/09/2012   Rate: 108  Rhythm: sinus tachycardia  QRS Axis: normal  Intervals: normal  ST/T Wave abnormalities: normal  Conduction Disutrbances:none  Narrative Interpretation:   Old EKG Reviewed: Compared with 10/31/2011, no significant changes   1. Sinusitis   2. Asthma exacerbation       MDM  The patient has a febrile illness, this is likely upper respiratory, we'll treat for sinusitis and for ongoing asthma exacerbation however the asthma exacerbation is mild at this time. She has not taken her daily dose of prednisone stating that she cannot get to the pharmacy today, she will be given her dose of prednisone and strongly encouraged to get there. She does not appear in acute distress and appears stable to continue her treatment in an outpatient setting.  The  patient has improved with medications, she appears stable for discharge, Tylenol given prior to discharge for fever.        Vida Roller, MD 03/09/12 [id number]  Vida Roller, MD 03/09/12 1191  Vida Roller, MD 03/09/12 4782  Vida Roller, MD 03/09/12 [phone]

## 2012-03-09 MED ORDER — PREDNISONE 20 MG PO TABS: 40.0000 mg | ORAL_TABLET | Freq: Every day | ORAL | Status: DC

## 2012-03-09 MED ORDER — AZITHROMYCIN 250 MG PO TABS: 250.0000 mg | ORAL_TABLET | Freq: Every day | ORAL | Status: DC

## 2012-03-09 MED ORDER — ACETAMINOPHEN 500 MG PO TABS
1000.0000 mg | ORAL_TABLET | Freq: Once | ORAL | Status: AC
  Administered 2012-03-09: 1000 mg via ORAL
  Filled 2012-03-09: qty 2

## 2012-03-09 NOTE — ED Notes (Signed)
Pt ambulated to bathroom and back without difficulty; No resp distress noted.

## 2012-05-01 ENCOUNTER — Emergency Department (HOSPITAL_COMMUNITY)
Admission: EM | Admit: 2012-05-01 | Discharge: 2012-05-02 | Disposition: A | Discharge status: Home / Self Care | Payer: Self-pay | Attending: Emergency Medicine | Admitting: Emergency Medicine

## 2012-05-01 ENCOUNTER — Encounter (HOSPITAL_COMMUNITY): Payer: Self-pay | Admitting: Emergency Medicine

## 2012-05-01 DIAGNOSIS — R059 Cough, unspecified: Secondary | ICD-10-CM | POA: Insufficient documentation

## 2012-05-01 DIAGNOSIS — J45909 Unspecified asthma, uncomplicated: Secondary | ICD-10-CM

## 2012-05-01 DIAGNOSIS — J45901 Unspecified asthma with (acute) exacerbation: Secondary | ICD-10-CM | POA: Insufficient documentation

## 2012-05-01 DIAGNOSIS — R05 Cough: Secondary | ICD-10-CM | POA: Insufficient documentation

## 2012-05-01 DIAGNOSIS — Z87891 Personal history of nicotine dependence: Secondary | ICD-10-CM | POA: Insufficient documentation

## 2012-05-01 DIAGNOSIS — Z79899 Other long term (current) drug therapy: Secondary | ICD-10-CM | POA: Insufficient documentation

## 2012-05-01 DIAGNOSIS — J069 Acute upper respiratory infection, unspecified: Secondary | ICD-10-CM | POA: Insufficient documentation

## 2012-05-01 DIAGNOSIS — J3489 Other specified disorders of nose and nasal sinuses: Secondary | ICD-10-CM | POA: Insufficient documentation

## 2012-05-01 MED ORDER — ALBUTEROL SULFATE (5 MG/ML) 0.5% IN NEBU
5.0000 mg | INHALATION_SOLUTION | Freq: Once | RESPIRATORY_TRACT | Status: AC
  Administered 2012-05-01: 5 mg via RESPIRATORY_TRACT
  Filled 2012-05-01: qty 1

## 2012-05-01 MED ORDER — IPRATROPIUM BROMIDE 0.02 % IN SOLN
0.5000 mg | Freq: Once | RESPIRATORY_TRACT | Status: AC
  Administered 2012-05-01: 0.5 mg via RESPIRATORY_TRACT
  Filled 2012-05-01: qty 2.5

## 2012-05-01 NOTE — ED Notes (Signed)
[id number]<AV> Expected date:<BR> Expected time:<BR> Means of arrival:<BR> Comments:<BR> CLOSED

## 2012-05-01 NOTE — ED Notes (Signed)
Pt alert, arrives from home, c/o sob, wheezing, onset was this evening, resp even unlabored, skin pwd, O2 sat RA 100%

## 2012-05-02 MED ORDER — ALBUTEROL SULFATE (5 MG/ML) 0.5% IN NEBU
5.0000 mg | INHALATION_SOLUTION | Freq: Once | RESPIRATORY_TRACT | Status: AC
  Administered 2012-05-02: 5 mg via RESPIRATORY_TRACT
  Filled 2012-05-02: qty 1

## 2012-05-02 MED ORDER — IPRATROPIUM BROMIDE 0.02 % IN SOLN
0.5000 mg | Freq: Once | RESPIRATORY_TRACT | Status: AC
Start: 2012-05-02 — End: 2012-05-02
  Administered 2012-05-02: 0.5 mg via RESPIRATORY_TRACT
  Filled 2012-05-02: qty 2.5

## 2012-05-02 MED ORDER — PREDNISONE 20 MG PO TABS
60.0000 mg | ORAL_TABLET | Freq: Once | ORAL | Status: AC
  Administered 2012-05-02: 60 mg via ORAL
  Filled 2012-05-02: qty 3

## 2012-05-02 MED ORDER — ALBUTEROL SULFATE HFA 108 (90 BASE) MCG/ACT IN AERS
2.0000 | INHALATION_SPRAY | RESPIRATORY_TRACT | Status: DC | PRN
  Administered 2012-05-02: 2 via RESPIRATORY_TRACT
  Filled 2012-05-02: qty 6.7

## 2012-05-02 MED ORDER — PREDNISONE 10 MG PO TABS: 20.0000 mg | ORAL_TABLET | Freq: Every day | ORAL | Status: DC

## 2012-05-02 NOTE — ED Notes (Signed)
D/c home with rx x 1 for prednisone and albuterol inhaler

## 2012-05-02 NOTE — ED Provider Notes (Signed)
History     CSN: [id number]  Arrival date & time 05/01/12  2315   First MD Initiated Contact with Patient 05/01/12 2356      Chief Complaint  Patient presents with  . Shortness of Breath   HPI  History provided by the patient. Patient is a 27 year old female with past history of asthma and seasonal allergies who presents with complaints of increased shortness of breath and wheezing. Symptoms first began with slight cough and congestion yesterday. Symptoms became acutely worse with shortness of breath and wheezing around 6 PM. Symptoms are described as moderate. Patient no longer has an albuterol inhaler and has not used any treatment of her symptoms. She denies any recent fever, chills or sweats. Denies any known sick contacts. Denies any other aggravating or alleviating factors. Denies any other associated symptoms.     Past Medical History  Diagnosis Date  . Asthma     seasonal  . Shortness of breath   . Tobacco abuse   . Seasonal allergies     History reviewed. No pertinent past surgical history.  Family History  Problem Relation Age of Onset  . Cancer Mother     breast  . Asthma Mother   . Asthma Father   . Diabetes type II Paternal Aunt     History  Substance Use Topics  . Smoking status: Former Games developer  . Smokeless tobacco: Not on file  . Alcohol Use: Yes    OB History    Grav Para Term Preterm Abortions TAB SAB Ect Mult Living                  Review of Systems  Constitutional: Negative for fever and chills.  HENT: Positive for congestion. Negative for rhinorrhea.   Respiratory: Positive for cough, shortness of breath and wheezing.   Gastrointestinal: Negative for nausea, vomiting, abdominal pain and diarrhea.  All other systems reviewed and are negative.    Allergies  Review of patient's allergies indicates no known allergies.  Home Medications   Current Outpatient Rx  Name  Route  Sig  Dispense  Refill  . ALBUTEROL SULFATE HFA 108 (90 BASE)  MCG/ACT IN AERS   Inhalation   Inhale 2 puffs into the lungs every 6 (six) hours as needed. For wheezing or shortness of breath           BP 120/74  Pulse 82  Temp 98.3 F (36.8 C) (Oral)  Resp 24  SpO2 98%  LMP 04/29/2012  Physical Exam  Nursing note and vitals reviewed. Constitutional: She is oriented to person, place, and time. She appears well-developed and well-nourished. No distress.  HENT:  Head: Normocephalic.  Mouth/Throat: Oropharynx is clear and moist.  Eyes: Conjunctivae normal are normal.  Neck: Normal range of motion. Neck supple.  Cardiovascular: Normal rate and regular rhythm.   Pulmonary/Chest: Effort normal. No respiratory distress. She has wheezes. She has no rales.  Abdominal: Soft. There is no tenderness. There is no rebound.  Musculoskeletal: Normal range of motion. She exhibits no edema.  Neurological: She is alert and oriented to person, place, and time.  Skin: Skin is warm and dry. No rash noted.  Psychiatric: She has a normal mood and affect. Her behavior is normal.    ED Course  Procedures     1. Asthma   2. URI (upper respiratory infection)       MDM  Patient seen and evaluated. She appears well in no acute distress. Currently with normal respirations  and O2 sats. She does feel better after initial breathing treatment in the emergency room. On exam she continues to have some expiratory wheezing. She feels she may benefit from a second breathing treatment.  Second breathing treatment given as well as dose of prednisone. Patient will be continued on short course of prednisone and sent home with albuterol inhaler.        Angus Seller, PA 05/02/12 [phone]

## 2012-05-04 NOTE — ED Provider Notes (Signed)
Medical screening examination/treatment/procedure(s) were performed by non-physician practitioner and as supervising physician I was immediately available for consultation/collaboration.   Loren Racer, MD 05/04/12 [phone]

## 2012-06-25 ENCOUNTER — Encounter (HOSPITAL_COMMUNITY): Payer: Self-pay | Admitting: Emergency Medicine

## 2012-06-25 ENCOUNTER — Emergency Department (INDEPENDENT_AMBULATORY_CARE_PROVIDER_SITE_OTHER)
Admission: EM | Admit: 2012-06-25 | Discharge: 2012-06-25 | Disposition: A | Discharge status: Home / Self Care | Payer: PRIVATE HEALTH INSURANCE | Source: Home / Self Care

## 2012-06-25 DIAGNOSIS — J309 Allergic rhinitis, unspecified: Secondary | ICD-10-CM

## 2012-06-25 DIAGNOSIS — J45901 Unspecified asthma with (acute) exacerbation: Secondary | ICD-10-CM

## 2012-06-25 MED ORDER — ALBUTEROL SULFATE (5 MG/ML) 0.5% IN NEBU
INHALATION_SOLUTION | RESPIRATORY_TRACT | Status: AC
  Filled 2012-06-25: qty 1

## 2012-06-25 MED ORDER — TRIAMCINOLONE ACETONIDE 40 MG/ML IJ SUSP
40.0000 mg | Freq: Once | INTRAMUSCULAR | Status: AC
  Administered 2012-06-25: 40 mg via INTRAMUSCULAR

## 2012-06-25 MED ORDER — METHYLPREDNISOLONE 4 MG PO KIT: PACK | ORAL | Status: DC

## 2012-06-25 MED ORDER — IPRATROPIUM BROMIDE 0.02 % IN SOLN
0.5000 mg | Freq: Once | RESPIRATORY_TRACT | Status: AC
  Administered 2012-06-25: 0.5 mg via RESPIRATORY_TRACT

## 2012-06-25 MED ORDER — ALBUTEROL SULFATE (5 MG/ML) 0.5% IN NEBU
5.0000 mg | INHALATION_SOLUTION | Freq: Once | RESPIRATORY_TRACT | Status: AC
  Administered 2012-06-25: 5 mg via RESPIRATORY_TRACT

## 2012-06-25 MED ORDER — TRIAMCINOLONE ACETONIDE 40 MG/ML IJ SUSP
INTRAMUSCULAR | Status: AC
  Filled 2012-06-25: qty 5

## 2012-06-25 NOTE — ED Provider Notes (Signed)
History     CSN: [id number]  Arrival date & time 06/25/12  1100   First MD Initiated Contact with Patient 06/25/12 1148      Chief Complaint  Patient presents with  . Asthma    (Consider location/radiation/quality/duration/timing/severity/associated sxs/prior treatment) HPI Comments: This 27 year old female has a history of asthma but does not have a physician or medications. She states that she had an asthma flareup last night continuing into the day. She took 210 mg tablets of old prednisone. That was her only medication available. She is complaining of shortness of breath associated with the wheezes and cough. Denies upper respiratory congestion, fever, abdominal pain, vomiting or diarrhea.   Past Medical History  Diagnosis Date  . Asthma     seasonal  . Shortness of breath   . Tobacco abuse   . Seasonal allergies     History reviewed. No pertinent past surgical history.  Family History  Problem Relation Age of Onset  . Cancer Mother     breast  . Asthma Mother   . Asthma Father   . Diabetes type II Paternal Aunt     History  Substance Use Topics  . Smoking status: Former Games developer  . Smokeless tobacco: Not on file  . Alcohol Use: Yes    OB History   Grav Para Term Preterm Abortions TAB SAB Ect Mult Living                  Review of Systems  Constitutional: Positive for activity change. Negative for fever, chills, appetite change and fatigue.  HENT: Positive for postnasal drip. Negative for congestion, sore throat, facial swelling, rhinorrhea, neck pain and neck stiffness.   Eyes: Negative.   Respiratory: Positive for cough, shortness of breath and wheezing. Negative for stridor.   Cardiovascular: Negative.   Gastrointestinal: Negative.   Musculoskeletal: Negative.   Skin: Negative for pallor and rash.  Neurological: Negative.   Psychiatric/Behavioral: Negative.     Allergies  Review of patient's allergies indicates no known allergies.  Home  Medications   Current Outpatient Rx  Name  Route  Sig  Dispense  Refill  . albuterol (PROVENTIL HFA;VENTOLIN HFA) 108 (90 BASE) MCG/ACT inhaler   Inhalation   Inhale 2 puffs into the lungs every 6 (six) hours as needed. For wheezing or shortness of breath         . methylPREDNISolone (MEDROL DOSEPAK) 4 MG tablet      follow package directions   21 tablet   0   . predniSONE (DELTASONE) 10 MG tablet   Oral   Take 2 tablets (20 mg total) by mouth daily.   10 tablet   0     BP 129/76  Pulse 71  Temp(Src) 98.4 F (36.9 C) (Oral)  Resp 16  SpO2 98%  LMP 06/23/2012  Physical Exam  Nursing note and vitals reviewed. Constitutional: She is oriented to person, place, and time. She appears well-developed and well-nourished. No distress.  HENT:  Mouth/Throat: Oropharynx is clear and moist.  Eyes: Conjunctivae and EOM are normal.  Neck: Normal range of motion. Neck supple.  Cardiovascular: Normal rate, regular rhythm and normal heart sounds.   Pulmonary/Chest: No respiratory distress. She has wheezes. She has no rales.  Increased effort with respirations. Positive for inspiratory and expiratory wheeze. No crackles.  Musculoskeletal: Normal range of motion. She exhibits no edema.  Lymphadenopathy:    She has no cervical adenopathy.  Neurological: She is alert and oriented to person, place,  and time.  Skin: Skin is warm and dry. No rash noted.  Psychiatric: She has a normal mood and affect.    ED Course  Procedures (including critical care time)  Labs Reviewed - No data to display No results found.   1. Asthma exacerbation   2. Allergic rhinitis       MDM  Duo neb and Kenalog 40 mg IM This patient is breathing better and auscultation reveals a significant decrease in wheeze and improvement in air movement. She will be treated with a refill of albuterol HFA 2 puffs every 4-6 hours when necessary cough and wheeze And Qvar 42 puffs twice a day Medrol Dosepak for one  week Obtain physician as soon as possible since he now have insurance. Return for any worsening new symptoms or problems. Recommend start taking Claritin 10 mg a day for History of allergic rhinitis.        Hayden Rasmussen, NP 06/25/12 1305  Hayden Rasmussen, NP 06/25/12 1306

## 2012-06-25 NOTE — ED Notes (Signed)
Pt is here for asthma flare up since yest Sx include: SOB, wheezing, fever, cough w/yellow mucous Denies: v/n/d Took x2 prednisone of the 10mg  yest Hx of asthma  She is alert and oriented w/no signs of distress.

## 2012-06-25 NOTE — ED Notes (Signed)
Dawn Rasmussen, NP has been notified

## 2012-06-26 NOTE — ED Notes (Addendum)
Patient called regarding MDI Rx; spoke w Dr Artis Flock , who authorized albuterol MDI; called in to [facility] [place] at pt request

## 2012-06-27 NOTE — ED Provider Notes (Signed)
Medical screening examination/treatment/procedure(s) were performed by non-physician practitioner and as supervising physician I was immediately available for consultation/collaboration.   [facility]; MD  Sharin Grave, MD 06/27/12 [phone]

## 2012-08-08 IMAGING — CR DG CHEST 2V
2 series · 2 of 2 positions shown · non-contrast
Comparison: 02/26/2011

CLINICAL DATA: Shortness of breath.  Cough.  Wheezing.  Asthma.

CHEST - 2 VIEW

[w chest pa]
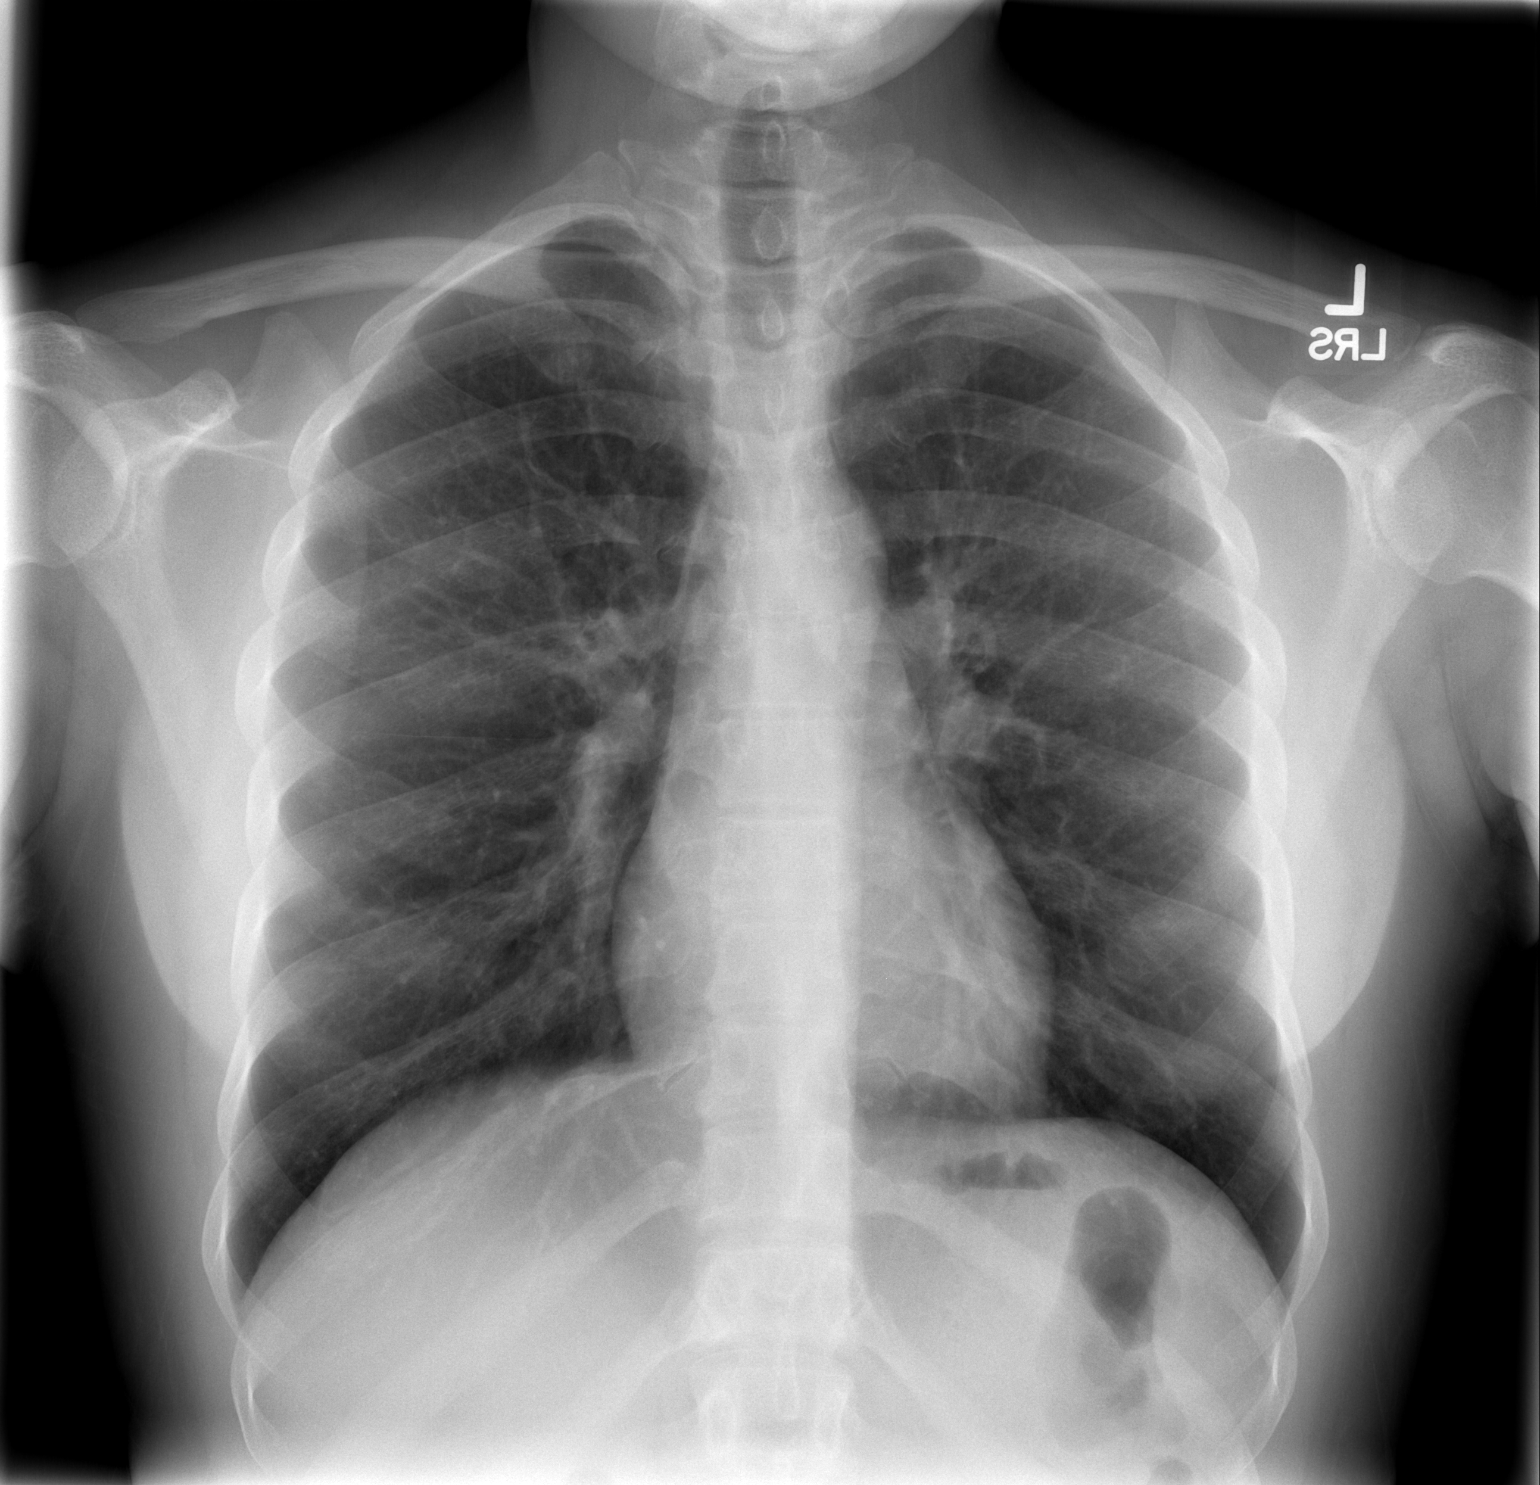

[w chest lat]
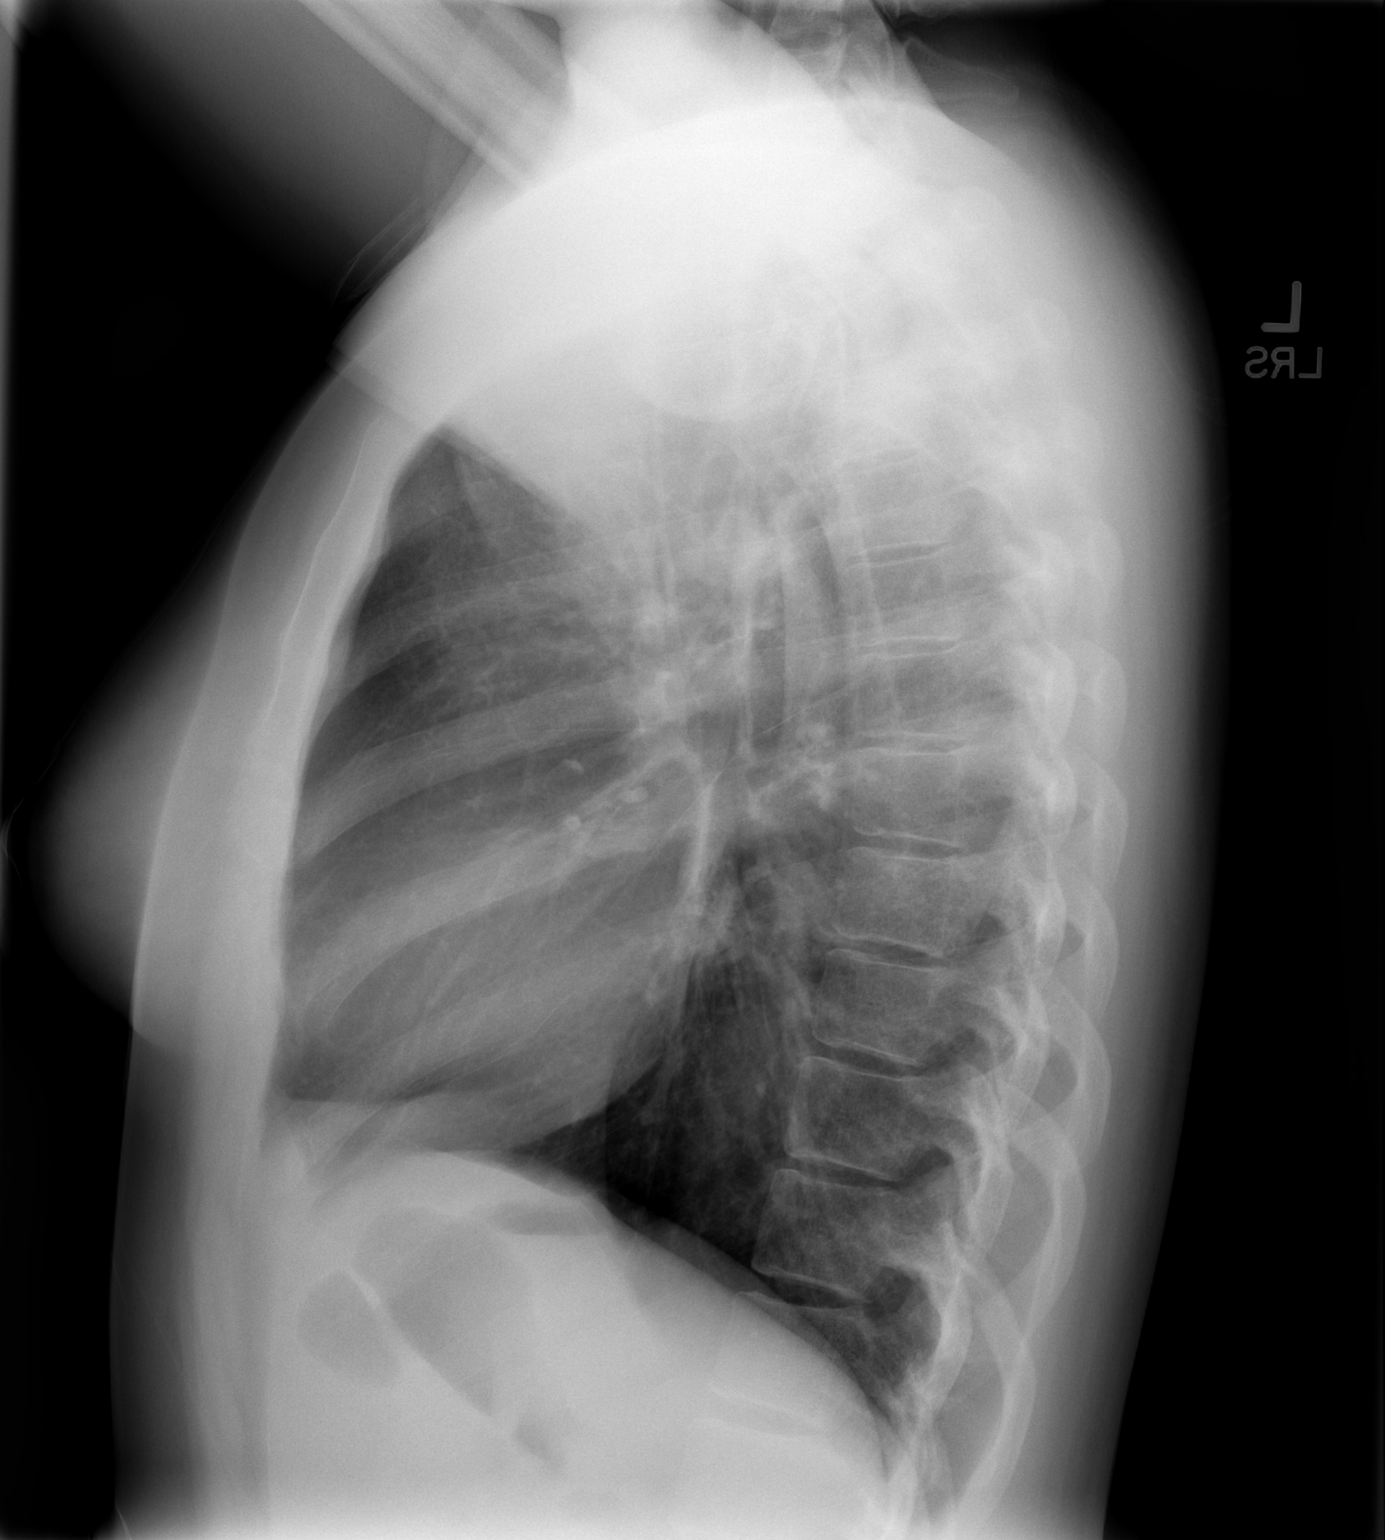

[2 of 2 positions shown; findings below may reference images not displayed]

FINDINGS: The heart size and mediastinal contours are within
normal limits.  Both lungs are clear.  The visualized skeletal
structures are unremarkable.
IMPRESSION: No active cardiopulmonary disease.

## 2013-01-18 ENCOUNTER — Emergency Department (HOSPITAL_COMMUNITY)
Admission: EM | Admit: 2013-01-18 | Discharge: 2013-01-18 | Disposition: A | Discharge status: Home / Self Care | Payer: PRIVATE HEALTH INSURANCE | Attending: Emergency Medicine | Admitting: Emergency Medicine

## 2013-01-18 ENCOUNTER — Encounter (HOSPITAL_COMMUNITY): Payer: Self-pay | Admitting: Emergency Medicine

## 2013-01-18 DIAGNOSIS — Z79899 Other long term (current) drug therapy: Secondary | ICD-10-CM | POA: Insufficient documentation

## 2013-01-18 DIAGNOSIS — Z87891 Personal history of nicotine dependence: Secondary | ICD-10-CM | POA: Insufficient documentation

## 2013-01-18 DIAGNOSIS — F1010 Alcohol abuse, uncomplicated: Secondary | ICD-10-CM

## 2013-01-18 DIAGNOSIS — Z791 Long term (current) use of non-steroidal anti-inflammatories (NSAID): Secondary | ICD-10-CM | POA: Insufficient documentation

## 2013-01-18 DIAGNOSIS — J45909 Unspecified asthma, uncomplicated: Secondary | ICD-10-CM | POA: Insufficient documentation

## 2013-01-18 NOTE — ED Notes (Signed)
Per pt, states she has been drinking since she was 14-drinks daily, "all the time"-laughing inappropriately

## 2013-01-18 NOTE — Progress Notes (Signed)
[facility] did not get to see patient but will be sending information about the [facility] [employer] Card program, using the address provided.  °

## 2013-01-18 NOTE — ED Provider Notes (Signed)
CSN: [id number]     Arrival date & time 01/18/13  1251 History   First MD Initiated Contact with Patient 01/18/13 1301     Chief Complaint  Patient presents with  . Drug / Alcohol Assessment   (Consider location/radiation/quality/duration/timing/severity/associated sxs/prior Treatment) Patient is a 27 y.o. female presenting with drug/alcohol assessment. The history is provided by the patient.  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 Has been in rehab programs before but hasnt maintained sobriety.  Pt indicates currently drinking several x per week. No recent or abrupt change in drinking pattern.  Denies depression or thoughts of self harm. Is smiling/cheerful appearing. Pt denies any unusual stressors or recent physical illness. Denies pain/injury.  When stops drinking denies severe tremors, seizures or nv. No hx seizures. No hx dts.  Denies other substance abuse.      Past Medical History  Diagnosis Date  . Asthma     seasonal  . Shortness of breath   . Tobacco abuse   . Seasonal allergies    History reviewed. No pertinent past surgical history. Family History  Problem Relation Age of Onset  . Cancer Mother     breast  . Asthma Mother   . Asthma Father   . Diabetes type II Paternal Aunt    History  Substance Use Topics  . Smoking status: Former Games developer  . Smokeless tobacco: Not on file  . Alcohol Use: Yes     Comment: daily   OB History   Grav Para Term Preterm Abortions TAB SAB Ect Mult Living                 Review of Systems  Constitutional: Negative for fever.  Eyes: Negative for redness.  Respiratory: Negative for shortness of breath.   Cardiovascular: Negative for chest pain.  Gastrointestinal: Negative for abdominal pain.  Genitourinary: Negative for flank pain.   Musculoskeletal: Negative for back pain and neck pain.  Skin: Negative for rash.  Neurological: Negative for headaches.  Hematological: Does not bruise/bleed easily.  Psychiatric/Behavioral: Negative for suicidal ideas and dysphoric mood.    Allergies  Review of patient's allergies indicates no known allergies.  Home Medications   Current Outpatient Rx  Name  Route  Sig  Dispense  Refill  . albuterol (PROVENTIL HFA;VENTOLIN HFA) 108 (90 BASE) MCG/ACT inhaler   Inhalation   Inhale 2 puffs into the lungs every 6 (six) hours as needed. For wheezing or shortness of breath         . naproxen sodium (ANAPROX) 220 MG tablet   Oral   Take 220 mg by mouth 2 (two) times daily with a meal.          BP 139/75  Pulse 89  Temp(Src) 98.7 F (37.1 C) (Oral)  Resp 18  SpO2 100%  LMP 12/28/2012 Physical Exam  Nursing note and vitals reviewed. Constitutional: She is oriented to person, place, and time. She appears well-developed and well-nourished. No distress.  HENT:  Head: Atraumatic.  Eyes: Conjunctivae are normal. Pupils are equal, round, and reactive to light. No scleral icterus.  Neck: Neck supple. No tracheal deviation present.  Cardiovascular: Normal rate.   Pulmonary/Chest: Effort normal and breath sounds normal. No respiratory distress.  Abdominal: Soft. Normal appearance. She exhibits no distension. There is no tenderness.  Musculoskeletal: She exhibits no edema.  Neurological: She is alert and oriented to person, place, and time.  Steady gait.   Skin: Skin is warm and dry. No rash noted. She is not diaphoretic.  Psychiatric:  Smiling, alert. ?etoh intoxication.     ED Course  Procedures (including critical care time)  EKG Interpretation   None       MDM  Pt ambulates w steady gait about ed, to bathroom and back.    No acute depression or SI. Clear thought process. No hallucinations or delusional thinking. No acute psychosis.  Pt denies hx etoh withdrawal  seizures or dts.  Pt appears stable for d/c. States roommate brought her and can drive home.  Encouraged to follow up with AA, and additional community resources/resource guide provided.       Suzi Roots, MD 01/18/13 [phone]

## 2014-06-21 ENCOUNTER — Encounter (HOSPITAL_COMMUNITY): Payer: Self-pay | Admitting: Oncology

## 2014-06-21 ENCOUNTER — Emergency Department (HOSPITAL_COMMUNITY)
Admission: EM | Admit: 2014-06-21 | Discharge: 2014-06-21 | Disposition: A | Discharge status: Home / Self Care | Payer: PRIVATE HEALTH INSURANCE | Attending: Emergency Medicine | Admitting: Emergency Medicine

## 2014-06-21 ENCOUNTER — Emergency Department (HOSPITAL_COMMUNITY): Payer: PRIVATE HEALTH INSURANCE

## 2014-06-21 DIAGNOSIS — Z720 Tobacco use: Secondary | ICD-10-CM | POA: Insufficient documentation

## 2014-06-21 DIAGNOSIS — J45901 Unspecified asthma with (acute) exacerbation: Secondary | ICD-10-CM | POA: Insufficient documentation

## 2014-06-21 DIAGNOSIS — J9801 Acute bronchospasm: Secondary | ICD-10-CM

## 2014-06-21 DIAGNOSIS — Z79899 Other long term (current) drug therapy: Secondary | ICD-10-CM | POA: Insufficient documentation

## 2014-06-21 MED ORDER — IPRATROPIUM-ALBUTEROL 0.5-2.5 (3) MG/3ML IN SOLN
3.0000 mL | Freq: Once | RESPIRATORY_TRACT | Status: AC
  Administered 2014-06-21: 3 mL via RESPIRATORY_TRACT
  Filled 2014-06-21: qty 3

## 2014-06-21 MED ORDER — ALBUTEROL SULFATE HFA 108 (90 BASE) MCG/ACT IN AERS
1.0000 | INHALATION_SPRAY | Freq: Four times a day (QID) | RESPIRATORY_TRACT | Status: DC | PRN
  Administered 2014-06-21: 2 via RESPIRATORY_TRACT

## 2014-06-21 MED ORDER — ALBUTEROL SULFATE HFA 108 (90 BASE) MCG/ACT IN AERS
INHALATION_SPRAY | RESPIRATORY_TRACT | Status: AC
  Filled 2014-06-21: qty 6.7

## 2014-06-21 MED ORDER — DEXAMETHASONE 4 MG PO TABS
12.0000 mg | ORAL_TABLET | Freq: Once | ORAL | Status: AC
  Administered 2014-06-21: 12 mg via ORAL
  Filled 2014-06-21: qty 3

## 2014-06-21 NOTE — Discharge Instructions (Signed)
Bronchospasm °A bronchospasm is a spasm or tightening of the airways going into the lungs. During a bronchospasm breathing becomes more difficult because the airways get smaller. When this happens there can be coughing, a whistling sound when breathing (wheezing), and difficulty breathing. Bronchospasm is often associated with asthma, but not all patients who experience a bronchospasm have asthma. °CAUSES  °A bronchospasm is caused by inflammation or irritation of the airways. The inflammation or irritation may be triggered by:  °· Allergies (such as to animals, pollen, food, or mold). Allergens that cause bronchospasm may cause wheezing immediately after exposure or many hours later.   °· Infection. Viral infections are believed to be the most common cause of bronchospasm.   °· Exercise.   °· Irritants (such as pollution, cigarette smoke, strong odors, aerosol sprays, and paint fumes).   °· Weather changes. Winds increase molds and pollens in the air. Rain refreshes the air by washing irritants out. Cold air may cause inflammation.   °· Stress and emotional upset.   °SIGNS AND SYMPTOMS  °· Wheezing.   °· Excessive nighttime coughing.   °· Frequent or severe coughing with a simple cold.   °· Chest tightness.   °· Shortness of breath.   °DIAGNOSIS  °Bronchospasm is usually diagnosed through a history and physical exam. Tests, such as chest X-rays, are sometimes done to look for other conditions. °TREATMENT  °· Inhaled medicines can be given to open up your airways and help you breathe. The medicines can be given using either an inhaler or a nebulizer machine. °· Corticosteroid medicines may be given for severe bronchospasm, usually when it is associated with asthma. °HOME CARE INSTRUCTIONS  °· Always have a plan prepared for seeking medical care. Know when to call your health care provider and local emergency services (911 in the [place] Know where you can access local emergency care. °· Only take medicines as  directed by your health care provider. °· If you were prescribed an inhaler or nebulizer machine, ask your health care provider to explain how to use it correctly. Always use a spacer with your inhaler if you were given one. °· It is necessary to remain calm during an attack. Try to relax and breathe more slowly.  °· Control your home environment in the following ways:   °¨ Change your heating and air conditioning filter at least once a month.   °¨ Limit your use of fireplaces and wood stoves. °¨ Do not smoke and do not allow smoking in your home.   °¨ Avoid exposure to perfumes and fragrances.   °¨ Get rid of pests (such as roaches and mice) and their droppings.   °¨ Throw away plants if you see mold on them.   °¨ Keep your house clean and dust free.   °¨ Replace carpet with wood, tile, or vinyl flooring. Carpet can trap dander and dust.   °¨ Use allergy-proof pillows, mattress covers, and box spring covers.   °¨ Wash bed sheets and blankets every week in hot water and dry them in a dryer.   °¨ Use blankets that are made of polyester or cotton.   °¨ Wash hands frequently. °SEEK MEDICAL CARE IF:  °· You have muscle aches.   °· You have chest pain.   °· The sputum changes from clear or white to yellow, green, gray, or bloody.   °· The sputum you cough up gets thicker.   °· There are problems that may be related to the medicine you are given, such as a rash, itching, swelling, or trouble breathing.   °SEEK IMMEDIATE MEDICAL CARE IF:  °· You have worsening wheezing and coughing even   after taking your prescribed medicines.   °· You have increased difficulty breathing.   °· You develop severe chest pain. °MAKE SURE YOU:  °· Understand these instructions. °· Will watch your condition. °· Will get help right away if you are not doing well or get worse. °Document Released: 03/17/2003 Document Revised: 03/19/2013 Document Reviewed: 09/03/2012 °ExitCare® Patient Information ©2015 [employer]. This information is not  intended to replace advice given to you by your health care provider. Make sure you discuss any questions you have with your health care provider. ° °

## 2014-06-21 NOTE — ED Provider Notes (Signed)
CSN: [id number]     Arrival date & time 06/21/14  2148 History   First MD Initiated Contact with Patient 06/21/14 2203     Chief Complaint  Patient presents with  . Shortness of Breath     (Consider location/radiation/quality/duration/timing/severity/associated sxs/prior Treatment) HPI   29 year old female with cough and shortness of breath. Symptom onset a few hours ago. Feels sob. Cough. Occasionally productive for clear. Sputum. No hemoptysis. No fevers or chills. No unusual leg pain or swelling. No intervention prior to arrival. Past history of asthma.  Past Medical History  Diagnosis Date  . Asthma     seasonal  . Shortness of breath   . Tobacco abuse   . Seasonal allergies    History reviewed. No pertinent past surgical history. Family History  Problem Relation Age of Onset  . Cancer Mother     breast  . Asthma Mother   . Asthma Father   . Diabetes type II Paternal Aunt    History  Substance Use Topics  . Smoking status: Current Some Day Smoker  . Smokeless tobacco: Not on file  . Alcohol Use: Yes     Comment: daily   OB History    No data available     Review of Systems  All systems reviewed and negative, other than as noted in HPI.   Allergies  Review of patient's allergies indicates no known allergies.  Home Medications   Prior to Admission medications   Medication Sig Start Date End Date Taking? Authorizing Provider  Multiple Vitamin (MULTIVITAMIN WITH MINERALS) TABS tablet Take 1 tablet by mouth daily.   Yes Historical Provider, MD  naproxen sodium (ANAPROX) 220 MG tablet Take 220 mg by mouth 2 (two) times daily as needed (pain).    Yes Historical Provider, MD  albuterol (PROVENTIL HFA;VENTOLIN HFA) 108 (90 BASE) MCG/ACT inhaler Inhale 2 puffs into the lungs every 6 (six) hours as needed. For wheezing or shortness of breath 02/28/11   Marinda Elk, MD   BP 123/72 mmHg  Pulse 85  Temp(Src) 98.4 F (36.9 C) (Oral)  Resp 18  Ht  (1.575  m)  Wt 157 lb (71.215 kg)  BMI 28.71 kg/m2  SpO2 100%  LMP 05/19/2014 Physical Exam  Constitutional: She appears well-developed and well-nourished. No distress.  HENT:  Head: Normocephalic and atraumatic.  Eyes: Conjunctivae are normal. Right eye exhibits no discharge. Left eye exhibits no discharge.  Neck: Neck supple.  Cardiovascular: Normal rate, regular rhythm and normal heart sounds.  Exam reveals no gallop and no friction rub.   No murmur heard. Pulmonary/Chest: Effort normal. No respiratory distress. She has wheezes.  Abdominal: Soft. She exhibits no distension. There is no tenderness.  Musculoskeletal: She exhibits no edema or tenderness.  Lower extremities symmetric as compared to each other. No calf tenderness. Negative Homan's. No palpable cords.   Neurological: She is alert.  Skin: Skin is warm and dry.  Psychiatric: She has a normal mood and affect. Her behavior is normal. Thought content normal.  Nursing note and vitals reviewed.   ED Course  Procedures (including critical care time) Labs Review Labs Reviewed - No data to display  Imaging Review Dg Chest 2 View (if Patient Has Fever And/or Copd)  06/21/2014   CLINICAL DATA:  Shortness of breath, cough 3 hr ago.  EXAM: CHEST  2 VIEW  COMPARISON:  03/07/2012  FINDINGS: The heart size and mediastinal contours are within normal limits. Both lungs are clear. The visualized skeletal  structures are unremarkable.  IMPRESSION: No active cardiopulmonary disease.   Electronically Signed   By: Charlett NoseKevin  Dover M.D.   On: 06/21/2014 22:38     EKG Interpretation None      MDM   Final diagnoses:  Bronchospasm, acute        Raeford RazorStephen Jarl Sellitto, MD 06/25/14 1500

## 2014-06-21 NOTE — ED Notes (Addendum)
Per pt she began to cough 3 hours ago and began to have SOB.  Pt is having difficulty speaking however respirations are even and unlabored.  VSS.  Pt reports coughing up thick clear sputum.  Denies sick contact.

## 2014-07-15 ENCOUNTER — Encounter (HOSPITAL_COMMUNITY): Payer: Self-pay | Admitting: Emergency Medicine

## 2014-07-15 ENCOUNTER — Emergency Department (HOSPITAL_COMMUNITY): Payer: PRIVATE HEALTH INSURANCE

## 2014-07-15 ENCOUNTER — Inpatient Hospital Stay (HOSPITAL_COMMUNITY)
Admission: EM | Admit: 2014-07-15 | Discharge: 2014-07-17 | DRG: 202 | Disposition: A | Discharge status: Home / Self Care | Payer: Self-pay | Attending: Internal Medicine | Admitting: Internal Medicine

## 2014-07-15 DIAGNOSIS — J45902 Unspecified asthma with status asthmaticus: Principal | ICD-10-CM | POA: Diagnosis present

## 2014-07-15 DIAGNOSIS — Z825 Family history of asthma and other chronic lower respiratory diseases: Secondary | ICD-10-CM

## 2014-07-15 DIAGNOSIS — J45901 Unspecified asthma with (acute) exacerbation: Secondary | ICD-10-CM | POA: Insufficient documentation

## 2014-07-15 DIAGNOSIS — T380X5A Adverse effect of glucocorticoids and synthetic analogues, initial encounter: Secondary | ICD-10-CM | POA: Diagnosis not present

## 2014-07-15 DIAGNOSIS — Z720 Tobacco use: Secondary | ICD-10-CM | POA: Diagnosis not present

## 2014-07-15 DIAGNOSIS — J4542 Moderate persistent asthma with status asthmaticus: Secondary | ICD-10-CM

## 2014-07-15 DIAGNOSIS — E876 Hypokalemia: Secondary | ICD-10-CM | POA: Diagnosis present

## 2014-07-15 DIAGNOSIS — J9600 Acute respiratory failure, unspecified whether with hypoxia or hypercapnia: Secondary | ICD-10-CM | POA: Diagnosis present

## 2014-07-15 DIAGNOSIS — R0602 Shortness of breath: Secondary | ICD-10-CM

## 2014-07-15 DIAGNOSIS — J9601 Acute respiratory failure with hypoxia: Secondary | ICD-10-CM | POA: Diagnosis not present

## 2014-07-15 LAB — BLOOD GAS, ARTERIAL
Acid-Base Excess: 0.7 mmol/L (ref 0.0–2.0)
Bicarbonate: 24.4 mEq/L — ABNORMAL HIGH (ref 20.0–24.0)
Drawn by: 103701
O2 Saturation: 95.2 %
PH ART: 7.426 (ref 7.350–7.450)
Patient temperature: 98.6
TCO2: 21.4 mmol/L (ref 0–100)
pCO2 arterial: 37.7 mmHg (ref 35.0–45.0)
pO2, Arterial: 79.6 mmHg — ABNORMAL LOW (ref 80.0–100.0)

## 2014-07-15 LAB — CBC WITH DIFFERENTIAL/PLATELET
BASOS ABS: 0.1 10*3/uL (ref 0.0–0.1)
Basophils Relative: 1 % (ref 0–1)
Eosinophils Absolute: 0.3 10*3/uL (ref 0.0–0.7)
Eosinophils Relative: 4 % (ref 0–5)
HEMATOCRIT: 42 % (ref 36.0–46.0)
HEMOGLOBIN: 14.2 g/dL (ref 12.0–15.0)
LYMPHS PCT: 37 % (ref 12–46)
Lymphs Abs: 2.8 10*3/uL (ref 0.7–4.0)
MCH: 32.6 pg (ref 26.0–34.0)
MCHC: 33.8 g/dL (ref 30.0–36.0)
MCV: 96.6 fL (ref 78.0–100.0)
MONO ABS: 0.7 10*3/uL (ref 0.1–1.0)
MONOS PCT: 10 % (ref 3–12)
NEUTROS ABS: 3.7 10*3/uL (ref 1.7–7.7)
Neutrophils Relative %: 48 % (ref 43–77)
Platelets: 235 10*3/uL (ref 150–400)
RBC: 4.35 MIL/uL (ref 3.87–5.11)
RDW: 12.5 % (ref 11.5–15.5)
WBC: 7.6 10*3/uL (ref 4.0–10.5)

## 2014-07-15 LAB — BASIC METABOLIC PANEL
Anion gap: 7 (ref 5–15)
BUN: 6 mg/dL (ref 6–23)
CHLORIDE: 107 mmol/L (ref 96–112)
CO2: 25 mmol/L (ref 19–32)
Calcium: 8.5 mg/dL (ref 8.4–10.5)
Creatinine, Ser: 0.58 mg/dL (ref 0.50–1.10)
GLUCOSE: 97 mg/dL (ref 70–99)
POTASSIUM: 3 mmol/L — AB (ref 3.5–5.1)
SODIUM: 139 mmol/L (ref 135–145)

## 2014-07-15 LAB — MRSA PCR SCREENING: MRSA by PCR: NEGATIVE

## 2014-07-15 MED ORDER — HEPARIN SODIUM (PORCINE) 5000 UNIT/ML IJ SOLN
5000.0000 [IU] | Freq: Three times a day (TID) | INTRAMUSCULAR | Status: DC
  Filled 2014-07-15 (×7): qty 1

## 2014-07-15 MED ORDER — ALBUTEROL (5 MG/ML) CONTINUOUS INHALATION SOLN
10.0000 mg/h | INHALATION_SOLUTION | Freq: Once | RESPIRATORY_TRACT | Status: AC
  Administered 2014-07-15: 10 mg/h via RESPIRATORY_TRACT
  Filled 2014-07-15: qty 20

## 2014-07-15 MED ORDER — METHYLPREDNISOLONE SODIUM SUCC 125 MG IJ SOLR
125.0000 mg | Freq: Once | INTRAMUSCULAR | Status: AC
  Administered 2014-07-15: 125 mg via INTRAVENOUS
  Filled 2014-07-15: qty 2

## 2014-07-15 MED ORDER — ALBUTEROL SULFATE (2.5 MG/3ML) 0.083% IN NEBU
5.0000 mg | INHALATION_SOLUTION | RESPIRATORY_TRACT | Status: DC | PRN
  Administered 2014-07-15 – 2014-07-16 (×3): 5 mg via RESPIRATORY_TRACT
  Filled 2014-07-15 (×4): qty 6

## 2014-07-15 MED ORDER — METHYLPREDNISOLONE SODIUM SUCC 125 MG IJ SOLR
80.0000 mg | Freq: Four times a day (QID) | INTRAMUSCULAR | Status: DC
  Administered 2014-07-15 – 2014-07-16 (×3): 80 mg via INTRAVENOUS
  Filled 2014-07-15 (×3): qty 2

## 2014-07-15 MED ORDER — ADULT MULTIVITAMIN W/MINERALS CH
1.0000 | ORAL_TABLET | Freq: Every day | ORAL | Status: DC
  Administered 2014-07-15 – 2014-07-17 (×3): 1 via ORAL
  Filled 2014-07-15 (×3): qty 1

## 2014-07-15 NOTE — Progress Notes (Signed)
Dawn Robertson is a 29 y.o. female patient admitted from ED awake, alert - oriented  X 3 - no acute distress noted.  VSS - Blood pressure 120/69, pulse 110, temperature 97.4 F (36.3 C), temperature source Oral, resp. rate 20, height 5\' 2"  (1.575 m), weight 71.6 kg (157 lb 13.6 oz), last menstrual period 06/25/2014, SpO2 99 %.    IV in place, occlusive dsg intact without redness.  Orientation to room, and floor completed with information packet given to patient/family.  Patient declined safety video at this time.  Admission INP armband ID verified with patient/family, and in place.   SR up x 2, fall assessment complete, with patient and family able to verbalize understanding of risk associated with falls, and verbalized understanding to call nsg before up out of bed.  Call light within reach, patient able to voice, and demonstrate understanding.  Skin, clean-dry- intact without evidence of bruising, or skin tears.   No evidence of skin break down noted on exam.     Will cont to eval and treat per MD orders.  Earleen Aoun, Delos HaringSIERRA D, RN 07/15/2014 3:05 PM

## 2014-07-15 NOTE — H&P (Addendum)
[facility] History and Physical  Gwenyth Bouillonshley S Mergenthaler NWG:[id number]:[id number] DOB: 09/21/1985 DOA: 07/15/2014  Referring physician: Dr. Fayrene FearingJames PCP: Pcp Not In System  Specialists:  None currently  29 y/o Asthma-status asthmaticus 96-97%, no retaining, CXr looks ok RA-bronchospasm  2 x 1 hr nebs in ED  Pt began to have difficulty breathing last night but awoke struggling to breath and coughing around 0530 this am. She had to call EMS to bring her to the ED. Pt states she has been coughing since last night as well, describes productive sputum that is yellow/white in color. She states she moved back to the area from [place] in February of this year. She has not had an inhaler in multiple years because she did not need one while in [place]. Prior to her return to [place] her last bad asthma attack was 2014. She has not been admitted to the ICU in the past for her asthma. As a child she was never admitted to the hospital for her asthma but as an adult she has had to be multiple times. She was diagnosed with asthma at age 30, she also has seasonal allergies.   Family hx: Mother: seasonal allergies, no asthma.   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   - Fever, Chills -Blurred or double vision.  - CP - Nausea, vomiting. - Vaginal discharge, burning urination, chance of pregnancy.   Past Medical History  Diagnosis Date  . Asthma     seasonal  . Shortness of breath   . Tobacco abuse   . Seasonal allergies    History reviewed. No pertinent past surgical history. Social History:  History   Social History Narrative    No Known Allergies  Family History  Problem Relation Age of Onset  . Cancer Mother     breast  . Asthma Mother   . Asthma Father   . Diabetes type II Paternal Aunt     Prior to Admission medications   Medication Sig Start Date  End Date Taking? Authorizing Provider  Multiple Vitamin (MULTIVITAMIN WITH MINERALS) TABS tablet Take 1 tablet by mouth daily.   Yes Historical Provider, MD  naproxen sodium (ANAPROX) 220 MG tablet Take 440 mg by mouth 2 (two) times daily as needed (menstual pain).    Yes Historical Provider, MD   Physical Exam: Filed Vitals:   07/15/14 0817 07/15/14 0948 07/15/14 1022 07/15/14 1144  BP:   130/70 114/60  Pulse:   115 107  Temp:   98.6 F (37 C) 98.4 F (36.9 C)  TempSrc:    Oral  Resp:  18 18 14   SpO2: 98%  98% 100%   EOMI, NCAT Tired appearing but pleasant African-American female in no apparent significant cardiorespiratory distress Mild pallor, no icterus, no LAN, thyroid not assessed Chest has wheezes bilaterally and crackles S1-S2 tachycardic 100 range Abdomen soft nontender nondistended  No lower extremity edema      Labs on Admission:  Basic Metabolic Panel:  Recent Labs Lab 07/15/14 0753  NA 139  K 3.0*  CL 107  CO2 25  GLUCOSE 97  BUN 6  CREATININE 0.58  CALCIUM 8.5   Liver Function Tests: No results for input(s): AST, ALT, ALKPHOS, BILITOT, PROT, ALBUMIN in the last 168 hours. No results for input(s): LIPASE, AMYLASE in the last 168 hours. No results for input(s): AMMONIA in the last 168 hours.  CBC:  Recent Labs Lab 07/15/14 0753  WBC 7.6  NEUTROABS 3.7  HGB 14.2  HCT 42.0  MCV 96.6  PLT 235   Cardiac Enzymes: No results for input(s): CKTOTAL, CKMB, CKMBINDEX, TROPONINI in the last 168 hours.  BNP (last 3 results) No results for input(s): BNP in the last 8760 hours.  ProBNP (last 3 results) No results for input(s): PROBNP in the last 8760 hours.  CBG: No results for input(s): GLUCAP in the last 168 hours.  Radiological Exams on Admission: Dg Chest Port 1 View  07/15/2014   CLINICAL DATA:  Awoke this morning at 0515 hours with chest tightness and shortness of breath, problems with asthma and bronchitis symptoms for recently, smoker  EXAM:  PORTABLE CHEST - 1 VIEW  COMPARISON:  Portable exam 0855 hours compared to 06/21/2014  FINDINGS: Normal heart size, mediastinal contours and pulmonary vascularity.  Minimal chronic peribronchial thickening.  Lungs otherwise clear.  No pleural effusion or pneumothorax.  Osseous structures unremarkable.  IMPRESSION: Peribronchial thickening which could be related to bronchitis or asthma.  No acute infiltrate.   Electronically Signed   By: Ulyses Southward M.D.   On: 07/15/2014 09:16      Assessment/Plan Principal Problem:   Acute respiratory failure-likely secondary to status asthmaticus. ABG on admission showed PaO2 of 70s. She is now satting at 92% on room air. Solu-Medrol IV every 6, Admit stepdown close monitoring-her work of breathing was improved but there is potential for her to acutely worsen CXR 1 VW = peribronchial thickening 2 peak flows performed at bedside = 250, 300-Normal  Flow calculated for her age should be 480. She will get peak flows calculated every 8 hours, I have asked emergency room physician to give her IV magnesium and patient should be reevaluated if clinically worsening. Active Problems:   Tobacco abuse   Status asthmaticus  35 minutes No family at bedside Full CODE STATUS  Mahala Menghini [facility] Pager [phone]  If 7PM-7AM, please contact night-coverage [email or web address] Password [email or web address] 07/15/2014, 12:03 PM

## 2014-07-15 NOTE — ED Notes (Signed)
Per EMS pt woke this morning about 515 with tightness in her chest and feeling short of breath  Pt has been having problems with her asthma and bronchitis type symptoms lately  Upon EMS arrival pt was unable to speak and communicated by text  EMS gave total of albuterol 10mg /Atrovent 0.5  Pt states she feels better upon arrival to hospital  Pt receiving treatment upon arrival

## 2014-07-15 NOTE — ED Provider Notes (Addendum)
CSN: [id number]     Arrival date & time 07/15/14  0705 History   First MD Initiated Contact with Patient 07/15/14 [phone]     Chief Complaint  Patient presents with  . Asthma      HPI  Patient presents for evaluation difficult breathing. Awakened at 5:15 this morning feeling tight in her chest and short of breath. He has had a cough this morning. Taking only 1-2 words at a time upon arrival of EMS. Given 10 mg albuterol in route. States she feels improvement. Still dyspneic with auditory wheezing upon arrival.  Recently moved back to [place] from [place]. While living out of state she did not have difficulties or have to use inhalers. She previously lived in [place] and had an albuterol inhaler. Does not have one at home.    Past Medical History  Diagnosis Date  . Asthma     seasonal  . Shortness of breath   . Tobacco abuse   . Seasonal allergies    History reviewed. No pertinent past surgical history. Family History  Problem Relation Age of Onset  . Cancer Mother     breast  . Asthma Mother   . Asthma Father   . Diabetes type II Paternal Aunt    History  Substance Use Topics  . Smoking status: Current Some Day Smoker  . Smokeless tobacco: Not on file  . Alcohol Use: Yes     Comment: daily   OB History    No data available     Review of Systems  Constitutional: Negative for fever, chills, diaphoresis, appetite change and fatigue.  HENT: Negative for mouth sores, sore throat and trouble swallowing.   Eyes: Negative for visual disturbance.  Respiratory: Positive for cough, shortness of breath and wheezing. Negative for chest tightness.   Cardiovascular: Negative for chest pain.  Gastrointestinal: Negative for nausea, vomiting, abdominal pain, diarrhea and abdominal distention.  Endocrine: Negative for polydipsia, polyphagia and polyuria.  Genitourinary: Negative for dysuria, frequency and hematuria.  Musculoskeletal: Negative for gait problem.  Skin:  Negative for color change, pallor and rash.  Neurological: Negative for dizziness, syncope, light-headedness and headaches.  Hematological: Does not bruise/bleed easily.  Psychiatric/Behavioral: Negative for behavioral problems and confusion.      Allergies  Review of patient's allergies indicates no known allergies.  Home Medications   Prior to Admission medications   Medication Sig Start Date End Date Taking? Authorizing Provider  Multiple Vitamin (MULTIVITAMIN WITH MINERALS) TABS tablet Take 1 tablet by mouth daily.   Yes Historical Provider, MD  naproxen sodium (ANAPROX) 220 MG tablet Take 440 mg by mouth 2 (two) times daily as needed (menstual pain).    Yes Historical Provider, MD   BP 114/60 mmHg  Pulse 107  Temp(Src) 98.4 F (36.9 C) (Oral)  Resp 14  SpO2 100%  LMP 06/25/2014 (Approximate) Physical Exam  Constitutional: She is oriented to person, place, and time. She appears well-developed and well-nourished. No distress.  HENT:  Head: Normocephalic.  Eyes: Conjunctivae are normal. Pupils are equal, round, and reactive to light. No scleral icterus.  Neck: Normal range of motion. Neck supple. No thyromegaly present.  Cardiovascular: Normal rate and regular rhythm.  Exam reveals no gallop and no friction rub.   No murmur heard. Pulmonary/Chest: Effort normal. No respiratory distress. She has wheezes. She has no rales.  Speaking in full sentences.  Insp and Exp wheezing noted.  Abdominal: Soft. Bowel sounds are normal. She exhibits no distension. There is no  tenderness. There is no rebound.  Musculoskeletal: Normal range of motion.  Neurological: She is alert and oriented to person, place, and time.  Skin: Skin is warm and dry. No rash noted.  Psychiatric: She has a normal mood and affect. Her behavior is normal.    ED Course  Procedures (including critical care time) Labs Review Labs Reviewed  BASIC METABOLIC PANEL - Abnormal; Notable for the following:     Potassium 3.0 (*)    All other components within normal limits  BLOOD GAS, ARTERIAL - Abnormal; Notable for the following:    pO2, Arterial 79.6 (*)    Bicarbonate 24.4 (*)    All other components within normal limits  CBC WITH DIFFERENTIAL/PLATELET    Imaging Review Dg Chest Port 1 View  07/15/2014   CLINICAL DATA:  Awoke this morning at 0515 hours with chest tightness and shortness of breath, problems with asthma and bronchitis symptoms for recently, smoker  EXAM: PORTABLE CHEST - 1 VIEW  COMPARISON:  Portable exam 0855 hours compared to 06/21/2014  FINDINGS: Normal heart size, mediastinal contours and pulmonary vascularity.  Minimal chronic peribronchial thickening.  Lungs otherwise clear.  No pleural effusion or pneumothorax.  Osseous structures unremarkable.  IMPRESSION: Peribronchial thickening which could be related to bronchitis or asthma.  No acute infiltrate.   Electronically Signed   By: Ulyses Southward M.D.   On: 07/15/2014 09:16     EKG Interpretation None      CRITICAL CARE Performed by: Rolland Porter JOSEPH   Total critical care time: 60 minutes Start:  07:15 Start:08:15  Continuous Nebulized albuterol for 1 hors for tx of status asthmaticus  Critical care time was exclusive of separately billable procedures and treating other patients.  Critical care was necessary to treat or prevent imminent or life-threatening deterioration.  Critical care was time spent personally by me on the following activities: development of treatment plan with patient and/or surrogate as well as nursing, discussions with consultants, evaluation of patient's response to treatment, examination of patient, obtaining history from patient or surrogate, ordering and performing treatments and interventions, ordering and review of laboratory studies, ordering and review of radiographic studies, pulse oximetry and re-evaluation of patient's condition. Care   MDM   Final diagnoses:  SOB (shortness of  breath)  Status asthmaticus, unspecified asthma severity      Given albuterol neb PTA.  Feels some improvement, but with insp/exp wheezes on exam.  Additional continuous neb, and IV steroids.  Will reassess.      Rolland Porter, MD 07/15/14 2841  Rolland Porter, MD 07/15/14 [phone]

## 2014-07-16 DIAGNOSIS — J9601 Acute respiratory failure with hypoxia: Secondary | ICD-10-CM

## 2014-07-16 DIAGNOSIS — J45901 Unspecified asthma with (acute) exacerbation: Secondary | ICD-10-CM

## 2014-07-16 LAB — CBC
HCT: 39.7 % (ref 36.0–46.0)
HEMOGLOBIN: 13.4 g/dL (ref 12.0–15.0)
MCH: 32.6 pg (ref 26.0–34.0)
MCHC: 33.8 g/dL (ref 30.0–36.0)
MCV: 96.6 fL (ref 78.0–100.0)
PLATELETS: 238 10*3/uL (ref 150–400)
RBC: 4.11 MIL/uL (ref 3.87–5.11)
RDW: 12.4 % (ref 11.5–15.5)
WBC: 15.8 10*3/uL — ABNORMAL HIGH (ref 4.0–10.5)

## 2014-07-16 LAB — PROTIME-INR
INR: 0.99 (ref 0.00–1.49)
Prothrombin Time: 13.2 seconds (ref 11.6–15.2)

## 2014-07-16 LAB — COMPREHENSIVE METABOLIC PANEL
ALT: 12 U/L (ref 0–35)
ANION GAP: 8 (ref 5–15)
AST: 18 U/L (ref 0–37)
Albumin: 3.7 g/dL (ref 3.5–5.2)
Alkaline Phosphatase: 54 U/L (ref 39–117)
BUN: 7 mg/dL (ref 6–23)
CO2: 23 mmol/L (ref 19–32)
Calcium: 9.2 mg/dL (ref 8.4–10.5)
Chloride: 104 mmol/L (ref 96–112)
Creatinine, Ser: 0.53 mg/dL (ref 0.50–1.10)
GFR calc non Af Amer: >90 mL/min (ref >90)
Glucose, Bld: 160 mg/dL — ABNORMAL HIGH (ref 70–99)
POTASSIUM: 3.9 mmol/L (ref 3.5–5.1)
Sodium: 135 mmol/L (ref 135–145)
TOTAL PROTEIN: 7.4 g/dL (ref 6.0–8.3)
Total Bilirubin: 0.3 mg/dL (ref 0.3–1.2)

## 2014-07-16 MED ORDER — IPRATROPIUM-ALBUTEROL 0.5-2.5 (3) MG/3ML IN SOLN
3.0000 mL | Freq: Four times a day (QID) | RESPIRATORY_TRACT | Status: DC
  Administered 2014-07-16: 3 mL via RESPIRATORY_TRACT
  Filled 2014-07-16 (×2): qty 3

## 2014-07-16 MED ORDER — PREDNISONE 50 MG PO TABS
60.0000 mg | ORAL_TABLET | Freq: Two times a day (BID) | ORAL | Status: DC
  Administered 2014-07-16 – 2014-07-17 (×3): 60 mg via ORAL
  Filled 2014-07-16 (×4): qty 1
  Filled 2014-07-16: qty 3

## 2014-07-16 MED ORDER — ALBUTEROL SULFATE (2.5 MG/3ML) 0.083% IN NEBU
2.5000 mg | INHALATION_SOLUTION | RESPIRATORY_TRACT | Status: DC | PRN
  Administered 2014-07-16 – 2014-07-17 (×2): 2.5 mg via RESPIRATORY_TRACT
  Filled 2014-07-16 (×2): qty 3

## 2014-07-16 MED ORDER — LORATADINE 10 MG PO TABS
10.0000 mg | ORAL_TABLET | Freq: Every day | ORAL | Status: DC
  Administered 2014-07-16 – 2014-07-17 (×2): 10 mg via ORAL
  Filled 2014-07-16 (×2): qty 1

## 2014-07-16 NOTE — Progress Notes (Signed)
CARE MANAGEMENT NOTE 07/16/2014  Patient:  Gwenyth BouillonWILLOUGHBY,Sarely S   Account Number:  [id number]  Date Initiated:  07/16/2014  Documentation initiated by:  Ferdinand CavaSCHETTINO,Khrystina Bonnes  Subjective/Objective Assessment:   29 yo female admitted with acute respiratory failure related to asthma     Action/Plan:   discharge planning   Anticipated DC Date:  07/17/2014   Anticipated DC Plan:  HOME/SELF CARE      DC Planning Services  CM consult      Choice offered to / List presented to:             Status of service:   Medicare Important Message given?   (If response is "NO", the following Medicare IM given date fields will be blank) Date Medicare IM given:   Medicare IM given by:   Date Additional Medicare IM given:   Additional Medicare IM given by:    Discharge Disposition:    Per UR Regulation:    If discussed at Long Length of Stay Meetings, dates discussed:    Comments:  07/16/14 Margarita SermonsANdrea Schettino Renette Hsu RN BSN CM [phone] Recieved consult to assist with PCP. Attempted x2 to visit with patient but was asleep. Will continue to follow

## 2014-07-16 NOTE — Progress Notes (Addendum)
[facility] PROGRESS NOTE  Dawn Robertson UXL:[id number] DOB: 06/20/1985 DOA: 07/15/2014  PCP: Does not have a primary care physician.  Brief HPI: 29 year old African-American female presented with difficulty breathing. She has a known history of asthma. She was admitted for further management.  Past medical history:  Past Medical History  Diagnosis Date  . Asthma     seasonal  . Shortness of breath   . Tobacco abuse   . Seasonal allergies     Consultants: None  Procedures: None  Antibiotics: None  Subjective: Patient feels well this morning. Overnight she did have another episode of cough induced shortness of breath.  Objective: Vital Signs  Filed Vitals:   07/16/14 0328 07/16/14 0334 07/16/14 0354 07/16/14 0442  BP:  125/69    Pulse:  87    Temp: 98.1 F (36.7 C)     TempSrc: Oral     Resp:  13    Height:      Weight:    72.8 kg (160 lb 7.9 oz)  SpO2:  98% 97%     Intake/Output Summary (Last 24 hours) at 07/16/14 0738 Last data filed at 07/15/14 2200  Gross per 24 hour  Intake    480 ml  Output      0 ml  Net    480 ml   Filed Weights   07/15/14 1400 07/16/14 0442  Weight: 71.6 kg (157 lb 13.6 oz) 72.8 kg (160 lb 7.9 oz)    General appearance: alert, cooperative, appears stated age and no distress Resp: Very few scattered wheezes bilaterally. No rhonchi. No crackles. Cardio: regular rate and rhythm, S1, S2 normal, no murmur, click, rub or gallop GI: soft, non-tender; bowel sounds normal; no masses,  no organomegaly Neurologic: No focal deficits.  Lab Results:  Basic Metabolic Panel:  Recent Labs Lab 07/15/14 0753 07/16/14 0300  NA 139 135  K 3.0* 3.9  CL 107 104  CO2 25 23  GLUCOSE 97 160*  BUN 6 7  CREATININE 0.58 0.53  CALCIUM 8.5 9.2   Liver Function Tests:  Recent Labs Lab 07/16/14 0300  AST 18  ALT 12  ALKPHOS 54  BILITOT 0.3  PROT 7.4  ALBUMIN 3.7   CBC:  Recent Labs Lab 07/15/14 0753 07/16/14 0300    WBC 7.6 15.8*  NEUTROABS 3.7  --   HGB 14.2 13.4  HCT 42.0 39.7  MCV 96.6 96.6  PLT 235 238     Recent Results (from the past 240 hour(s))  MRSA PCR Screening     Status: None   Collection Time: 07/15/14  2:56 PM  Result Value Ref Range Status   MRSA by PCR NEGATIVE NEGATIVE Final    Comment:        The GeneXpert MRSA Assay (FDA approved for NASAL specimens only), is one component of a comprehensive MRSA colonization surveillance program. It is not intended to diagnose MRSA infection nor to guide or monitor treatment for MRSA infections.       Studies/Results: Dg Chest Port 1 View  07/15/2014   CLINICAL DATA:  Awoke this morning at 0515 hours with chest tightness and shortness of breath, problems with asthma and bronchitis symptoms for recently, smoker  EXAM: PORTABLE CHEST - 1 VIEW  COMPARISON:  Portable exam 0855 hours compared to 06/21/2014  FINDINGS: Normal heart size, mediastinal contours and pulmonary vascularity.  Minimal chronic peribronchial thickening.  Lungs otherwise clear.  No pleural effusion or pneumothorax.  Osseous structures unremarkable.  IMPRESSION:  Peribronchial thickening which could be related to bronchitis or asthma.  No acute infiltrate.   Electronically Signed   By: Ulyses Southward M.D.   On: 07/15/2014 09:16    Medications:  Scheduled: . heparin  5,000 Units Subcutaneous 3 times per day  . loratadine  10 mg Oral Daily  . multivitamin with minerals  1 tablet Oral Daily  . predniSONE  60 mg Oral BID WC   Continuous:  [id number]  Assessment/Plan:  Principal Problem:   Acute respiratory failure Active Problems:   Tobacco abuse   Status asthmaticus    Acute respiratory failure secondary to asthma Improved with the current management. Okay for transfer to floor.  Acute asthma exacerbation Continue with steroids, nebulizer treatments. No need for antibiotics. Continue to monitor peak flows. Ambulate. Elevation in WBC is likely due to  steroids. She remains afebrile. Add Claritin.  DVT Prophylaxis: Heparin    Code Status: Full code  Family Communication: Discussed with the patient  Disposition Plan: Transfer to MedSurg. Anticipate discharge tomorrow morning.  Follow-up Appointment?: Will need PCP   LOS: 1 day   [facility]  [facility] Pager [phone] 07/16/2014, 7:38 AM  If 7PM-7AM, please contact night-coverage at [email or web address], password [facility]

## 2014-07-17 DIAGNOSIS — J45901 Unspecified asthma with (acute) exacerbation: Secondary | ICD-10-CM | POA: Insufficient documentation

## 2014-07-17 LAB — CBC
HEMATOCRIT: 40.6 % (ref 36.0–46.0)
HEMOGLOBIN: 13.4 g/dL (ref 12.0–15.0)
MCH: 32.4 pg (ref 26.0–34.0)
MCHC: 33 g/dL (ref 30.0–36.0)
MCV: 98.1 fL (ref 78.0–100.0)
Platelets: 265 10*3/uL (ref 150–400)
RBC: 4.14 MIL/uL (ref 3.87–5.11)
RDW: 12.8 % (ref 11.5–15.5)
WBC: 20.8 10*3/uL — AB (ref 4.0–10.5)

## 2014-07-17 LAB — BASIC METABOLIC PANEL
ANION GAP: 7 (ref 5–15)
BUN: 12 mg/dL (ref 6–23)
CHLORIDE: 102 mmol/L (ref 96–112)
CO2: 27 mmol/L (ref 19–32)
Calcium: 9.3 mg/dL (ref 8.4–10.5)
Creatinine, Ser: 0.52 mg/dL (ref 0.50–1.10)
Glucose, Bld: 114 mg/dL — ABNORMAL HIGH (ref 70–99)
POTASSIUM: 3.7 mmol/L (ref 3.5–5.1)
SODIUM: 136 mmol/L (ref 135–145)

## 2014-07-17 MED ORDER — ALBUTEROL SULFATE (2.5 MG/3ML) 0.083% IN NEBU: 2.5000 mg | INHALATION_SOLUTION | RESPIRATORY_TRACT | Status: DC | PRN

## 2014-07-17 MED ORDER — LORATADINE 10 MG PO TABS: 10.0000 mg | ORAL_TABLET | Freq: Every day | ORAL | Status: DC

## 2014-07-17 MED ORDER — ALBUTEROL SULFATE HFA 108 (90 BASE) MCG/ACT IN AERS: 2.0000 | INHALATION_SPRAY | Freq: Four times a day (QID) | RESPIRATORY_TRACT | Status: DC | PRN

## 2014-07-17 MED ORDER — PREDNISONE 20 MG PO TABS: ORAL_TABLET | ORAL | Status: DC

## 2014-07-17 NOTE — Discharge Summary (Signed)
[facility]  Physician Discharge Summary   Patient ID: Dawn Robertson MRN: [id number] DOB/AGE: [birth year] 28 y.o.  Admit date: 07/15/2014 Discharge date: 07/17/2014  PCP: Does not have one  DISCHARGE DIAGNOSES:  Principal Problem:   Acute respiratory failure Active Problems:   Tobacco abuse   Status asthmaticus   Asthma with exacerbation   RECOMMENDATIONS FOR OUTPATIENT FOLLOW UP: 1. Follow up with outpatient provider. Patient given information regarding the community clinic.   DISCHARGE CONDITION: fair  Diet recommendation: Regular  Filed Weights   07/15/14 1400 07/16/14 0442  Weight: 71.6 kg (157 lb 13.6 oz) 72.8 kg (160 lb 7.9 oz)    INITIAL HISTORY: 29 year old African-American female presented with difficulty breathing. She has a known history of asthma. She was admitted for further management.  HOSPITAL COURSE:   Acute respiratory failure secondary to asthma Improved with nebulizer treatments.   Acute asthma exacerbation Patient mentions that her symptoms have gotten worse ever since she moved back to [place]. Especially within the last month or 2 months. There could be an allergy component to her symptoms. She was started on Claritin. She was maintained on nebulizer treatments and steroids. Steroids have been tapered down. There was no indication for antibiotics. Elevation in WBC was secondary to steroids. Peak flows were monitored. She slowly started improving. She was ready for discharge today. Patient was educated regarding asthma symptoms. She was asked to follow-up with an outpatient provider for definitive management of her asthma . Especially if she has to use her inhalers frequently.   Stable for discharge   PERTINENT LABS:  The results of significant diagnostics from this hospitalization (including imaging, microbiology, ancillary and laboratory) are listed below for reference.    Microbiology: Recent Results (from the past 240  hour(s))  MRSA PCR Screening     Status: None   Collection Time: 07/15/14  2:56 PM  Result Value Ref Range Status   MRSA by PCR NEGATIVE NEGATIVE Final    Comment:        The GeneXpert MRSA Assay (FDA approved for NASAL specimens only), is one component of a comprehensive MRSA colonization surveillance program. It is not intended to diagnose MRSA infection nor to guide or monitor treatment for MRSA infections.      Labs: Basic Metabolic Panel:  Recent Labs Lab 07/15/14 0753 07/16/14 0300 07/17/14 0609  NA 139 135 136  K 3.0* 3.9 3.7  CL 107 104 102  CO2 GLUCOSE 97 160* 114*  BUN CREATININE 0.58 0.53 0.52  CALCIUM 8.5 9.2 9.3   Liver Function Tests:  Recent Labs Lab 07/16/14 0300  AST 18  ALT 12  ALKPHOS 54  BILITOT 0.3  PROT 7.4  ALBUMIN 3.7   CBC:  Recent Labs Lab 07/15/14 0753 07/16/14 0300 07/17/14 0609  WBC 7.6 15.8* 20.8*  NEUTROABS 3.7  --   --   HGB 14.2 13.4 13.4  HCT 42.0 39.7 40.6  MCV 96.6 96.6 98.1  PLT 235 238 265    IMAGING STUDIES Dg Chest 2 View (if Patient Has Fever And/or Copd)  06/21/2014   CLINICAL DATA:  Shortness of breath, cough 3 hr ago.  EXAM: CHEST  2 VIEW  COMPARISON:  03/07/2012  FINDINGS: The heart size and mediastinal contours are within normal limits. Both lungs are clear. The visualized skeletal structures are unremarkable.  IMPRESSION: No active cardiopulmonary disease.   Electronically Signed   By: Charlett Nose M.D.  On: 06/21/2014 22:38   Dg Chest Port 1 View  07/15/2014   CLINICAL DATA:  Awoke this morning at 0515 hours with chest tightness and shortness of breath, problems with asthma and bronchitis symptoms for recently, smoker  EXAM: PORTABLE CHEST - 1 VIEW  COMPARISON:  Portable exam 0855 hours compared to 06/21/2014  FINDINGS: Normal heart size, mediastinal contours and pulmonary vascularity.  Minimal chronic peribronchial thickening.  Lungs otherwise clear.  No pleural effusion or  pneumothorax.  Osseous structures unremarkable.  IMPRESSION: Peribronchial thickening which could be related to bronchitis or asthma.  No acute infiltrate.   Electronically Signed   By: Ulyses Southward M.D.   On: 07/15/2014 09:16    DISCHARGE EXAMINATION: Filed Vitals:   07/17/14 0414 07/17/14 0544 07/17/14 0700 07/17/14 1020  BP:  107/66 143/86 105/59  Pulse:  76 63 91  Temp:  98.2 F (36.8 C) 97.8 F (36.6 C) 98.1 F (36.7 C)  TempSrc:  Oral Oral Oral  Resp:  Height:      Weight:      SpO2: 98% 100% 99% 99%   General appearance: alert, cooperative, appears stated age and no distress Resp: clear to auscultation bilaterally Cardio: regular rate and rhythm, S1, S2 normal, no murmur, click, rub or gallop GI: soft, non-tender; bowel sounds normal; no masses,  no organomegaly Extremities: extremities normal, atraumatic, no cyanosis or edema  DISPOSITION: Home  Discharge Instructions    Call MD for:  difficulty breathing, headache or visual disturbances    Complete by:  As directed      Call MD for:  extreme fatigue    Complete by:  As directed      Call MD for:  persistant dizziness or light-headedness    Complete by:  As directed      Call MD for:  persistant nausea and vomiting    Complete by:  As directed      Call MD for:  severe uncontrolled pain    Complete by:  As directed      Call MD for:  temperature >100.4    Complete by:  As directed      Diet general    Complete by:  As directed      Discharge instructions    Complete by:  As directed   Please see an outpatient provider as soon as possible for further management of your asthma.  You were cared for by a hospitalist during your hospital stay. If you have any questions about your discharge medications or the care you received while you were in the hospital after you are discharged, you can call the unit and asked to speak with the hospitalist on call if the hospitalist that took care of you is not available.  Once you are discharged, your primary care physician will handle any further medical issues. Please note that NO REFILLS for any discharge medications will be authorized once you are discharged, as it is imperative that you return to your primary care physician (or establish a relationship with a primary care physician if you do not have one) for your aftercare needs so that they can reassess your need for medications and monitor your lab values. If you do not have a primary care physician, you can call [phone] for a physician referral.     Increase activity slowly    Complete by:  As directed            ALLERGIES: No Known Allergies  Discharge Medication List as of 07/17/2014 10:41 AM    START taking these medications   Details  albuterol (PROVENTIL HFA;VENTOLIN HFA) 108 (90 BASE) MCG/ACT inhaler Inhale 2 puffs into the lungs every 6 (six) hours as needed for wheezing or shortness of breath., Starting 07/17/2014, Until Discontinued, Print    albuterol (PROVENTIL) (2.5 MG/3ML) 0.083% nebulizer solution Take 3 mLs (2.5 mg total) by nebulization every 4 (four) hours as needed for wheezing or shortness of breath., Starting 07/17/2014, Until Discontinued, Print    loratadine (CLARITIN) 10 MG tablet Take 1 tablet (10 mg total) by mouth daily., Starting 07/17/2014, Until Discontinued, Print    predniSONE (DELTASONE) 20 MG tablet Take 3 tablets once daily for 3 days, then take 2 tablets once daily for 3 days, then take 1 tablet once daily for 3 days then STOP., Print      CONTINUE these medications which have NOT CHANGED   Details  Multiple Vitamin (MULTIVITAMIN WITH MINERALS) TABS tablet Take 1 tablet by mouth daily., Until Discontinued, Historical Med    naproxen sodium (ANAPROX) 220 MG tablet Take 440 mg by mouth 2 (two) times daily as needed (menstual pain). , Until Discontinued, Historical Med         TOTAL DISCHARGE TIME: 35 mins  [facility] Medical CenterKRISHNAN,Lariyah Shetterly  [facility] Pager  [phone]  07/17/2014, 4:13 PM

## 2014-07-17 NOTE — Discharge Instructions (Signed)

## 2014-07-17 NOTE — Progress Notes (Signed)
CARE MANAGEMENT NOTE 07/17/2014  Patient:  Dawn Robertson,Dawn Robertson   Account Number:  [id number]  Date Initiated:  07/16/2014  Documentation initiated by:  Ferdinand CavaSCHETTINO,Nao Linz  Subjective/Objective Assessment:   29 yo female admitted with acute respiratory failure related to asthma     Action/Plan:   discharge planning   Anticipated DC Date:  07/17/2014   Anticipated DC Plan:  HOME/SELF CARE      DC Planning Services  CM consult      Choice offered to / List presented to:     DME arranged  NEBULIZER MACHINE      DME agency  [facility]        Status of service:  Completed, signed off Medicare Important Message given?   (If response is "NO", the following Medicare IM given date fields will be blank) Date Medicare IM given:   Medicare IM given by:   Date Additional Medicare IM given:   Additional Medicare IM given by:    Discharge Disposition:  HOME/SELF CARE  Per UR Regulation:  Reviewed for med. necessity/level of care/duration of stay  If discussed at Long Length of Stay Meetings, dates discussed:    Comments:  07/17/14 Margarita SermonsAndrea Schettino Austyn Perriello RN BSN CM [phone] [facility] patient [facility] and discussed services. Encouraged patient to walk in or call and make follow up appointment and the importance of follow up care and access to medications. Patient verbalized understanding. Mayra ReelLecretia, DME specialist with [facility], certified patient for charity program and delivered the neb machine to the patients room prior to dc.  07/16/14 Margarita SermonsANdrea Schettino Neveah Bang RN BSN CM [phone] Recieved consult to assist with PCP. Attempted x2 to visit with patient but was asleep. Will continue to follow

## 2014-07-17 NOTE — Progress Notes (Signed)
Patient alert and oriented, discharge instructions given, patient verbalize understanding of discharge instructions given, My Chart acces declined at this time, patient in stable condition at this time

## 2014-07-22 ENCOUNTER — Ambulatory Visit: Payer: PRIVATE HEALTH INSURANCE | Attending: Family Medicine | Admitting: Family Medicine

## 2014-07-22 ENCOUNTER — Encounter: Payer: Self-pay | Admitting: Family Medicine

## 2014-07-22 VITALS — BP 103/68 | HR 91 | Temp 98.3°F | Resp 16 | Ht 62.0 in | Wt 161.0 lb

## 2014-07-22 DIAGNOSIS — Z87891 Personal history of nicotine dependence: Secondary | ICD-10-CM | POA: Insufficient documentation

## 2014-07-22 DIAGNOSIS — J4541 Moderate persistent asthma with (acute) exacerbation: Secondary | ICD-10-CM | POA: Diagnosis not present

## 2014-07-22 DIAGNOSIS — J9601 Acute respiratory failure with hypoxia: Secondary | ICD-10-CM

## 2014-07-22 DIAGNOSIS — J45901 Unspecified asthma with (acute) exacerbation: Secondary | ICD-10-CM | POA: Insufficient documentation

## 2014-07-22 MED ORDER — BECLOMETHASONE DIPROPIONATE 40 MCG/ACT IN AERS: 2.0000 | INHALATION_SPRAY | Freq: Two times a day (BID) | RESPIRATORY_TRACT | Status: DC

## 2014-07-22 NOTE — Progress Notes (Signed)
Subjective:    Patient ID: Dawn Robertson, female    DOB: 1985-04-30, 29 y.o.   MRN: [id number]  HPI  Admit date: 07/15/14 Discharge date: 07/17/14  Dawn Robertson had presented to the [facility] ED with chest tightness, cough, wheezing after moving back to [place] from [place] where she never had asthma symptoms and didn't have to use an inhaler. On presentation she was tachycardic in the 107s but was saturating 92 % and was diagnosed with acute respiratory failure secondary to status asthmaticus. She received nebulizer treatments and IV Solu-Medrol, IV magnesium. X-ray revealed peribronchial thickening; no antibiotics given. During her stay she had leukocytosis with a rise in WBC from 7.6 on admission to 20.8 at discharge which was thought to be secondary to steroids.  With improvement in symptoms Claritin was added and she was discharged on a rescue inhaler.  Interval History: Uses MDI often and has used it eight times since yesterday; just started taking medications which she picked up yesterday. Overall she feels better.  Past Medical History  Diagnosis Date  . Asthma     seasonal  . Shortness of breath   . Tobacco abuse   . Seasonal allergies     History reviewed. No pertinent past surgical history.  History   Social History  . Marital Status: Single    Spouse Name: N/A  . Number of Children: N/A  . Years of Education: N/A   Occupational History  . Not on file.   Social History Main Topics  . Smoking status: Former Games developer  . Smokeless tobacco: Never Used  . Alcohol Use: 0.0 oz/week    0 Standard drinks or equivalent per week     Comment: daily  . Drug Use: No  . Sexual Activity: No   Other Topics Concern  . Not on file   Social History Narrative    Family History  Problem Relation Age of Onset  . Cancer Mother     breast  . Asthma Mother   . Asthma Father   . Diabetes type II Paternal Aunt     No Known Allergies  Current Outpatient  Prescriptions on File Prior to Visit  Medication Sig Dispense Refill  . albuterol (PROVENTIL HFA;VENTOLIN HFA) 108 (90 BASE) MCG/ACT inhaler Inhale 2 puffs into the lungs every 6 (six) hours as needed for wheezing or shortness of breath. 1 Inhaler 2  . albuterol (PROVENTIL) (2.5 MG/3ML) 0.083% nebulizer solution Take 3 mLs (2.5 mg total) by nebulization every 4 (four) hours as needed for wheezing or shortness of breath. 75 mL 3  . loratadine (CLARITIN) 10 MG tablet Take 1 tablet (10 mg total) by mouth daily. 30 tablet 0  . Multiple Vitamin (MULTIVITAMIN WITH MINERALS) TABS tablet Take 1 tablet by mouth daily.    . naproxen sodium (ANAPROX) 220 MG tablet Take 440 mg by mouth 2 (two) times daily as needed (menstual pain).     . predniSONE (DELTASONE) 20 MG tablet Take 3 tablets once daily for 3 days, then take 2 tablets once daily for 3 days, then take 1 tablet once daily for 3 days then STOP. 18 tablet 0  . [DISCONTINUED] diphenhydrAMINE (BENADRYL) 25 MG tablet Take 25 mg by mouth every 6 (six) hours as needed. For allergies     No current facility-administered medications on file prior to visit.     Review of Systems  General: negative for fever, weight loss, appetite change Eyes: no visual symptoms. ENT: no ear symptoms, no  sinus tenderness, no nasal congestion or sore throat. Neck: no pain  Respiratory: no wheezing, shortness of breath, has cough Cardiovascular: no chest pain, no dyspnea on exertion, no pedal edema, no orthopnea. Gastrointestinal: no abdominal pain, no diarrhea, no constipation Genito-Urinary: no urinary frequency, no dysuria, no polyuria. Hematologic: no bruising Endocrine: no cold or heat intolerance Neurological: no headaches, no seizures, no tremors Musculoskeletal: no joint pains, no joint swelling Skin: no pruritus, no rash. Psychological: no depression, no anxiety,       Objective: Filed Vitals:   07/22/14 1447  BP: 103/68  Pulse: 91  Temp: 98.3 F (36.8  C)  Resp: 16      Physical Exam  Constitutional: normal appearing,  Eyes: PERRLA HEENT: Head is atraumatic, normal sinuses, normal oropharynx, normal appearing tonsils and palate, tympanic membrane is normal bilaterally. Neck: normal range of motion, no thyromegaly, no JVD Cardiovascular: normal rate and rhythm, normal heart sounds, no murmurs, rub or gallop, no pedal edema Respiratory: clear to auscultation bilaterally, no wheezes, no rales, no rhonchi Abdomen:, soft, not tender to palpation, normal bowel sounds, no enlarged organs Extremities: Full ROM, no tenderness in joints Skin: warm and dry, no lesions. Neurological: alert, oriented x3, cranial nerves I-XII grossly intact , normal motor strength, normal sensation. Psychological: normal mood.         Assessment & Plan:  29 year old female patient recently hospitalized for respiratory failure secondary to status asthmaticus now feeling better but is still having to use rescue inhalers often.  Acute asthma exacerbation: Improved but not at goal  Pollen is a major trigger for her and we have discussed avoiding and minimizing triggers. Will add inhaled Corticosteroid and she will be assessed at her next office visit for the need to upgrade or maintain current therapy.

## 2014-07-22 NOTE — Progress Notes (Signed)
HFU Pt was in the ED with an asthma attack. Pt reports feeling better and she got her medications yesterday.

## 2014-07-22 NOTE — Patient Instructions (Signed)

## 2014-11-27 ENCOUNTER — Observation Stay (HOSPITAL_COMMUNITY)
Admission: EM | Admit: 2014-11-27 | Discharge: 2014-11-29 | Disposition: A | Discharge status: Home / Self Care | Payer: Self-pay | Attending: Internal Medicine | Admitting: Internal Medicine

## 2014-11-27 ENCOUNTER — Emergency Department (HOSPITAL_COMMUNITY): Payer: Self-pay

## 2014-11-27 ENCOUNTER — Encounter (HOSPITAL_COMMUNITY): Payer: Self-pay | Admitting: Emergency Medicine

## 2014-11-27 DIAGNOSIS — J45909 Unspecified asthma, uncomplicated: Secondary | ICD-10-CM | POA: Diagnosis present

## 2014-11-27 DIAGNOSIS — R05 Cough: Secondary | ICD-10-CM

## 2014-11-27 DIAGNOSIS — J45901 Unspecified asthma with (acute) exacerbation: Principal | ICD-10-CM | POA: Diagnosis present

## 2014-11-27 DIAGNOSIS — Z79899 Other long term (current) drug therapy: Secondary | ICD-10-CM | POA: Insufficient documentation

## 2014-11-27 DIAGNOSIS — R059 Cough, unspecified: Secondary | ICD-10-CM

## 2014-11-27 DIAGNOSIS — Z7951 Long term (current) use of inhaled steroids: Secondary | ICD-10-CM | POA: Insufficient documentation

## 2014-11-27 DIAGNOSIS — Z87891 Personal history of nicotine dependence: Secondary | ICD-10-CM | POA: Insufficient documentation

## 2014-11-27 LAB — BASIC METABOLIC PANEL
ANION GAP: 11 (ref 5–15)
BUN: 7 mg/dL (ref 6–20)
CO2: 21 mmol/L — ABNORMAL LOW (ref 22–32)
Calcium: 8.6 mg/dL — ABNORMAL LOW (ref 8.9–10.3)
Chloride: 104 mmol/L (ref 101–111)
Creatinine, Ser: 0.68 mg/dL (ref 0.44–1.00)
GFR calc Af Amer: >60 mL/min (ref >60)
Glucose, Bld: 105 mg/dL — ABNORMAL HIGH (ref 65–99)
POTASSIUM: 3.1 mmol/L — AB (ref 3.5–5.1)
SODIUM: 136 mmol/L (ref 135–145)

## 2014-11-27 LAB — I-STAT BETA HCG BLOOD, ED (MC, WL, AP ONLY)

## 2014-11-27 LAB — CBC
HCT: 38.5 % (ref 36.0–46.0)
Hemoglobin: 13.2 g/dL (ref 12.0–15.0)
MCH: 32.7 pg (ref 26.0–34.0)
MCHC: 34.3 g/dL (ref 30.0–36.0)
MCV: 95.3 fL (ref 78.0–100.0)
PLATELETS: 205 10*3/uL (ref 150–400)
RBC: 4.04 MIL/uL (ref 3.87–5.11)
RDW: 12.3 % (ref 11.5–15.5)
WBC: 8.6 10*3/uL (ref 4.0–10.5)

## 2014-11-27 MED ORDER — SODIUM CHLORIDE 0.9 % IV SOLN
INTRAVENOUS | Status: DC
  Administered 2014-11-27: 18:00:00 via INTRAVENOUS
  Administered 2014-11-28: 1000 mL via INTRAVENOUS

## 2014-11-27 MED ORDER — LORATADINE 10 MG PO TABS
10.0000 mg | ORAL_TABLET | Freq: Every day | ORAL | Status: DC
  Administered 2014-11-28 – 2014-11-29 (×2): 10 mg via ORAL
  Filled 2014-11-27 (×3): qty 1

## 2014-11-27 MED ORDER — ACETAMINOPHEN 325 MG PO TABS: 650.0000 mg | ORAL_TABLET | Freq: Four times a day (QID) | ORAL | Status: DC | PRN

## 2014-11-27 MED ORDER — DOCUSATE SODIUM 100 MG PO CAPS
100.0000 mg | ORAL_CAPSULE | Freq: Two times a day (BID) | ORAL | Status: DC
  Filled 2014-11-27 (×3): qty 1

## 2014-11-27 MED ORDER — SODIUM CHLORIDE 0.9 % IV BOLUS (SEPSIS)
500.0000 mL | Freq: Once | INTRAVENOUS | Status: AC
  Administered 2014-11-27: 500 mL via INTRAVENOUS

## 2014-11-27 MED ORDER — ONDANSETRON HCL 4 MG PO TABS: 4.0000 mg | ORAL_TABLET | Freq: Four times a day (QID) | ORAL | Status: DC | PRN

## 2014-11-27 MED ORDER — METHYLPREDNISOLONE SODIUM SUCC 125 MG IJ SOLR
60.0000 mg | Freq: Three times a day (TID) | INTRAMUSCULAR | Status: DC
  Administered 2014-11-27 – 2014-11-29 (×6): 60 mg via INTRAVENOUS
  Filled 2014-11-27 (×6): qty 2

## 2014-11-27 MED ORDER — METHYLPREDNISOLONE SODIUM SUCC 125 MG IJ SOLR
125.0000 mg | Freq: Once | INTRAMUSCULAR | Status: AC
  Administered 2014-11-27: 125 mg via INTRAVENOUS
  Filled 2014-11-27: qty 2

## 2014-11-27 MED ORDER — ALBUTEROL SULFATE (2.5 MG/3ML) 0.083% IN NEBU: 5.0000 mg | INHALATION_SOLUTION | RESPIRATORY_TRACT | Status: DC

## 2014-11-27 MED ORDER — ALBUTEROL SULFATE (2.5 MG/3ML) 0.083% IN NEBU
2.5000 mg | INHALATION_SOLUTION | RESPIRATORY_TRACT | Status: DC
  Administered 2014-11-27 – 2014-11-28 (×3): 2.5 mg via RESPIRATORY_TRACT
  Filled 2014-11-27 (×3): qty 3

## 2014-11-27 MED ORDER — MAGNESIUM SULFATE 50 % IJ SOLN: 1.0000 g | Freq: Once | INTRAMUSCULAR | Status: DC

## 2014-11-27 MED ORDER — ONDANSETRON HCL 4 MG/2ML IJ SOLN: 4.0000 mg | Freq: Four times a day (QID) | INTRAMUSCULAR | Status: DC | PRN

## 2014-11-27 MED ORDER — ENOXAPARIN SODIUM 40 MG/0.4ML ~~LOC~~ SOLN
40.0000 mg | SUBCUTANEOUS | Status: DC
  Administered 2014-11-27: 40 mg via SUBCUTANEOUS
  Filled 2014-11-27 (×2): qty 0.4

## 2014-11-27 MED ORDER — POTASSIUM CHLORIDE CRYS ER 20 MEQ PO TBCR
40.0000 meq | EXTENDED_RELEASE_TABLET | Freq: Once | ORAL | Status: AC
  Administered 2014-11-27: 40 meq via ORAL
  Filled 2014-11-27: qty 2

## 2014-11-27 MED ORDER — ALBUTEROL (5 MG/ML) CONTINUOUS INHALATION SOLN
10.0000 mg/h | INHALATION_SOLUTION | Freq: Once | RESPIRATORY_TRACT | Status: AC
  Administered 2014-11-27: 10 mg/h via RESPIRATORY_TRACT
  Filled 2014-11-27: qty 20

## 2014-11-27 MED ORDER — GUAIFENESIN ER 600 MG PO TB12
600.0000 mg | ORAL_TABLET | Freq: Two times a day (BID) | ORAL | Status: DC
  Administered 2014-11-27: 600 mg via ORAL
  Filled 2014-11-27 (×2): qty 1

## 2014-11-27 MED ORDER — ACETAMINOPHEN 650 MG RE SUPP: 650.0000 mg | Freq: Four times a day (QID) | RECTAL | Status: DC | PRN

## 2014-11-27 MED ORDER — HYDROCODONE-ACETAMINOPHEN 5-325 MG PO TABS: 1.0000 | ORAL_TABLET | ORAL | Status: DC | PRN

## 2014-11-27 MED ORDER — ALBUTEROL SULFATE (2.5 MG/3ML) 0.083% IN NEBU
2.5000 mg | INHALATION_SOLUTION | RESPIRATORY_TRACT | Status: DC | PRN
  Administered 2014-11-28 – 2014-11-29 (×3): 2.5 mg via RESPIRATORY_TRACT
  Filled 2014-11-27 (×4): qty 3

## 2014-11-27 MED ORDER — MORPHINE SULFATE (PF) 2 MG/ML IV SOLN
1.0000 mg | INTRAVENOUS | Status: DC | PRN
  Filled 2014-11-27: qty 1

## 2014-11-27 MED ORDER — MAGNESIUM SULFATE IN D5W 10-5 MG/ML-% IV SOLN
1.0000 g | Freq: Once | INTRAVENOUS | Status: AC
  Administered 2014-11-27: 1 g via INTRAVENOUS
  Filled 2014-11-27: qty 100

## 2014-11-27 MED ORDER — LEVOFLOXACIN 750 MG PO TABS
750.0000 mg | ORAL_TABLET | Freq: Every day | ORAL | Status: DC
  Administered 2014-11-27 – 2014-11-29 (×3): 750 mg via ORAL
  Filled 2014-11-27 (×3): qty 1

## 2014-11-27 NOTE — ED Notes (Signed)
Having trouble breathing since Monday, gotten progressively worse. Hx of Asthma, has been taking continuous albuterol nebs all day. Per EMS they gave  of albuterol, then a duoneb in route. They state she's still wheezing throughout. They also gave 125 mg Solumedrol. Has seasonal allergies, works in a Biomedical scientist at [employer].

## 2014-11-27 NOTE — Progress Notes (Signed)
CM spoke with pt who confirms uninsured [place] resident with no pcp.  CM discussed and provided written information for uninsured accepting pcps, discussed the importance of pcp vs EDP services for f/u care, www.needymeds.org, www.goodrx.com, discounted pharmacies and other [place] resources such as [facility] , [facility], affordable care act, financial assistance, uninsured dental services, [place] med assist, DSS and  health department  Reviewed resources for [place] uninsured accepting pcps like [facility], [facility] [address], [facility], [facility], local urgent care centers, [facility], [facility], general medical clinics, [facility], [facility] plus others, medication resources, [facility] out patient pharmacies and housing Pt voiced understanding and appreciation of resources provided   Provided [facility] contact information  

## 2014-11-27 NOTE — H&P (Signed)
[facility] History and Physical  CYRENA KUCHENBECKER ZOX:[id number] DOB: 1985/11/05 DOA: 11/27/2014  Referring physician: ED physician.  PCP: Pcp Not In System   Chief Complaint: SOB  HPI: Dawn Robertson is a 29 y.o. female complaining of SOB, productive cough, yellow sputum, for last 3 to 4 days. Sick contact , her mother  has been coughing too. She has also ran out of her albuterol. She denies smoking. Denies chest pain. Feeling a little better after nebulizer treatments.  Denies abdominal pain, nausea, vomiting, chest pain. Relates subjective fevers.    Review of Systems:  Negative, except as per HPI  Past Medical History  Diagnosis Date  . Asthma     seasonal  . Shortness of breath   . Tobacco abuse   . Seasonal allergies    History reviewed. No pertinent past surgical history. Social History:  reports that she has quit smoking. She has never used smokeless tobacco. She reports that she drinks alcohol. She reports that she does not use illicit drugs.  No Known Allergies  Family History  Problem Relation Age of Onset  . Cancer Mother     breast  . Asthma Mother   . Asthma Father   . Diabetes type II Paternal Aunt     Prior to Admission medications   Medication Sig Start Date End Date Taking? Authorizing Provider  cetirizine (ZYRTEC) 10 MG tablet Take 10 mg by mouth daily.   Yes Historical Provider, MD  diphenhydrAMINE (SOMINEX) 25 MG tablet Take 25 mg by mouth at bedtime as needed for sleep.   Yes Historical Provider, MD  naproxen sodium (ANAPROX) 220 MG tablet Take 440 mg by mouth 2 (two) times daily as needed (menstual pain).    Yes Historical Provider, MD  albuterol (PROVENTIL HFA;VENTOLIN HFA) 108 (90 BASE) MCG/ACT inhaler Inhale 2 puffs into the lungs every 6 (six) hours as needed for wheezing or shortness of breath. 07/17/14   Osvaldo Shipper, MD  albuterol (PROVENTIL) (2.5 MG/3ML) 0.083% nebulizer solution Take 3 mLs (2.5 mg total) by nebulization every  4 (four) hours as needed for wheezing or shortness of breath. 07/17/14   Osvaldo Shipper, MD  beclomethasone (QVAR) 40 MCG/ACT inhaler Inhale 2 puffs into the lungs 2 (two) times daily. 07/22/14   Jaclyn Shaggy, MD  loratadine (CLARITIN) 10 MG tablet Take 1 tablet (10 mg total) by mouth daily. Patient not taking: Reported on 11/27/2014 07/17/14   Osvaldo Shipper, MD  predniSONE (DELTASONE) 20 MG tablet Take 3 tablets once daily for 3 days, then take 2 tablets once daily for 3 days, then take 1 tablet once daily for 3 days then STOP. Patient not taking: Reported on 11/27/2014 07/17/14   Osvaldo Shipper, MD   Physical Exam: Filed Vitals:   11/27/14 1329 11/27/14 1530 11/27/14 1615 11/27/14 1631  BP:    109/52  Pulse:  122 116 121  Resp:  26  28  SpO2: 100% 95% 95% 96%    Wt Readings from Last 3 Encounters:  07/22/14 73.029 kg (161 lb)  07/16/14 72.8 kg (160 lb 7.9 oz)  06/21/14 71.215 kg (157 lb)    General:  Appears calm and comfortable Eyes: PERRL, normal lids, irises & conjunctiva ENT: grossly normal hearing, lips & tongue Neck: no LAD, masses or thyromegaly Cardiovascular: RRR, no m/r/g. No LE edema. Telemetry: SR, no arrhythmias  Respiratory:  Normal respiratory effort. Bilateral expiratory wheezing.  Abdomen: soft, ntnd Skin: no rash or induration seen on limited exam Musculoskeletal: grossly  normal tone BUE/BLE Psychiatric: grossly normal mood and affect, speech fluent and appropriate Neurologic: grossly non-focal.          Labs on Admission:  Basic Metabolic Panel:  Recent Labs Lab 11/27/14 1259  NA 136  K 3.1*  CL 104  CO2 21*  GLUCOSE 105*  BUN 7  CREATININE 0.68  CALCIUM 8.6*   Liver Function Tests: No results for input(s): AST, ALT, ALKPHOS, BILITOT, PROT, ALBUMIN in the last 168 hours. No results for input(s): LIPASE, AMYLASE in the last 168 hours. No results for input(s): AMMONIA in the last 168 hours. CBC:  Recent Labs Lab 11/27/14 1259  WBC 8.6  HGB  13.2  HCT 38.5  MCV 95.3  PLT 205   Cardiac Enzymes: No results for input(s): CKTOTAL, CKMB, CKMBINDEX, TROPONINI in the last 168 hours.  BNP (last 3 results) No results for input(s): BNP in the last 8760 hours.  ProBNP (last 3 results) No results for input(s): PROBNP in the last 8760 hours.  CBG: No results for input(s): GLUCAP in the last 168 hours.  Radiological Exams on Admission: Dg Chest 2 View  11/27/2014   CLINICAL DATA:  Shortness of breath for 3 days  EXAM: CHEST  2 VIEW  COMPARISON:  July 15, 2014  FINDINGS: Lungs are clear. Heart size and pulmonary vascularity are normal. No adenopathy. No bone lesions.  IMPRESSION: No abnormality noted.   Electronically Signed   By: Bretta Bang III M.D.   On: 11/27/2014 14:37    ZOX:WRUE order EKG.   Assessment/Plan Active Problems:   Asthma exacerbation   Asthma  1-Asthma Exacerbation:  Admit under observation.  IV solumedrol Q 8 hours. Albuterol $ 4 hours.  levaquin to cover for infection.  Guaifenesin for cough.   2-Hypokalemia; replete with oral KCl.    Code Status: full code.  DVT Prophylaxis: Lovenox.  Family Communication; care discussed with patient.  Disposition Plan: expect less than 2 nights  Time spent: 75 minutes.   Hartley Barefoot A [facility] Pager [phone]

## 2014-11-27 NOTE — ED Provider Notes (Signed)
CSN: [id number]     Arrival date & time 11/27/14  1240 History   First MD Initiated Contact with Patient 11/27/14 1243     Chief Complaint  Patient presents with  . Asthma  . Shortness of Breath     (Consider location/radiation/quality/duration/timing/severity/associated sxs/prior Treatment) HPI   29 year old female comes in today with wheezing that began 3 days ago. It has worsened over the past 24 hours. She has a history of reactive airway disease. She has used albuterol 3 times in the past 24 hours. She's has had some URI type symptoms. She has had cough productive of yellowish sputum. She has not had fever or chills. Her symptoms began after moving to [place] several  Years ago.   last spring after having [place]. She had not previously had difficulty with seasonal allergies or wheezing. She is a former smoker.  Past Medical History  Diagnosis Date  . Asthma     seasonal  . Shortness of breath   . Tobacco abuse   . Seasonal allergies    History reviewed. No pertinent past surgical history. Family History  Problem Relation Age of Onset  . Cancer Mother     breast  . Asthma Mother   . Asthma Father   . Diabetes type II Paternal Aunt    Social History  Substance Use Topics  . Smoking status: Former Games developer  . Smokeless tobacco: Never Used  . Alcohol Use: 0.0 oz/week    0 Standard drinks or equivalent per week     Comment: daily   OB History    No data available     Review of Systems  All other systems reviewed and are negative.     Allergies  Review of patient's allergies indicates no known allergies.  Home Medications   Prior to Admission medications   Medication Sig Start Date End Date Taking? Authorizing Provider  cetirizine (ZYRTEC) 10 MG tablet Take 10 mg by mouth daily.   Yes Historical Provider, MD  diphenhydrAMINE (SOMINEX) 25 MG tablet Take 25 mg by mouth at bedtime as needed for sleep.   Yes Historical Provider, MD  naproxen sodium  (ANAPROX) 220 MG tablet Take 440 mg by mouth 2 (two) times daily as needed (menstual pain).    Yes Historical Provider, MD  albuterol (PROVENTIL HFA;VENTOLIN HFA) 108 (90 BASE) MCG/ACT inhaler Inhale 2 puffs into the lungs every 6 (six) hours as needed for wheezing or shortness of breath. 07/17/14   Osvaldo Shipper, MD  albuterol (PROVENTIL) (2.5 MG/3ML) 0.083% nebulizer solution Take 3 mLs (2.5 mg total) by nebulization every 4 (four) hours as needed for wheezing or shortness of breath. 07/17/14   Osvaldo Shipper, MD  beclomethasone (QVAR) 40 MCG/ACT inhaler Inhale 2 puffs into the lungs 2 (two) times daily. 07/22/14   Jaclyn Shaggy, MD  loratadine (CLARITIN) 10 MG tablet Take 1 tablet (10 mg total) by mouth daily. Patient not taking: Reported on 11/27/2014 07/17/14   Osvaldo Shipper, MD  predniSONE (DELTASONE) 20 MG tablet Take 3 tablets once daily for 3 days, then take 2 tablets once daily for 3 days, then take 1 tablet once daily for 3 days then STOP. Patient not taking: Reported on 11/27/2014 07/17/14   Osvaldo Shipper, MD   BP 122/75 mmHg  Pulse 99  Resp 20  SpO2 100%  LMP 11/27/2014 Physical Exam  Constitutional: She is oriented to person, place, and time. She appears well-developed and well-nourished.  HENT:  Head: Normocephalic  and atraumatic.  Right Ear: External ear normal.  Left Ear: External ear normal.  Nose: Nose normal.  Mouth/Throat: Oropharynx is clear and moist.  Eyes: Conjunctivae and EOM are normal. Pupils are equal, round, and reactive to light.  Neck: Normal range of motion. Neck supple. No JVD present. No tracheal deviation present. No thyromegaly present.  Cardiovascular: Normal rate, regular rhythm, normal heart sounds and intact distal pulses.   Pulmonary/Chest: Effort normal. She has wheezes.  Increased respiratory rate.  Abdominal: Soft. Bowel sounds are normal. She exhibits no mass. There is no tenderness. There is no guarding.  Musculoskeletal: Normal range of motion.   Lymphadenopathy:    She has no cervical adenopathy.  Neurological: She is alert and oriented to person, place, and time. She has normal reflexes. No cranial nerve deficit or sensory deficit. Gait normal. GCS eye subscore is 4. GCS verbal subscore is 5. GCS motor subscore is 6.  Reflex Scores:      Bicep reflexes are 2+ on the right side and 2+ on the left side.      Patellar reflexes are 2+ on the right side and 2+ on the left side. Strength is normal and equal throughout. Cranial nerves grossly intact. Patient fluent. No gross ataxia and patient able to ambulate without difficulty.  Skin: Skin is warm and dry.  Psychiatric: She has a normal mood and affect. Her behavior is normal. Judgment and thought content normal.  Nursing note and vitals reviewed.   ED Course  Procedures (including critical care time) Labs Review Labs Reviewed  BASIC METABOLIC PANEL - Abnormal; Notable for the following:    Potassium 3.1 (*)    CO2 21 (*)    Glucose, Bld 105 (*)    Calcium 8.6 (*)    All other components within normal limits  CBC  I-STAT BETA HCG BLOOD, ED ([facility], [facility], [facility] ONLY)    Imaging Review Dg Chest 2 View  11/27/2014   CLINICAL DATA:  Shortness of breath for 3 days  EXAM: CHEST  2 VIEW  COMPARISON:  July 15, 2014  FINDINGS: Lungs are clear. Heart size and pulmonary vascularity are normal. No adenopathy. No bone lesions.  IMPRESSION: No abnormality noted.   Electronically Signed   By: Bretta Bang III M.D.   On: 11/27/2014 14:37   I have personally reviewed and evaluated these images and lab results as part of my medical decision-making.   EKG Interpretation None      MDM   Final diagnoses:  Asthma exacerbation    29 year old female with history of asthma who presents today with increased wheezing over the past several days. She has received total of 20 mg of albuterol and 250 mg of Solu-Medrol. She continues to have expiratory wheezes. She has continued increased work of  breathing with increased rest her respiratory rate at 24 and heart rate is in the 120s.. Oxygen saturation is 94%. She is mildly hypokalemic and she is being orally repleted chest x-Arushi Partridge shows no evidence of acute infiltrate.  Patient care discussed with Dr. Sunnie Nielsen and patient to be admitted.   Margarita Grizzle, MD 11/29/14 [id number]

## 2014-11-28 LAB — CBC
HCT: 38.1 % (ref 36.0–46.0)
Hemoglobin: 13.1 g/dL (ref 12.0–15.0)
MCH: 33 pg (ref 26.0–34.0)
MCHC: 34.4 g/dL (ref 30.0–36.0)
MCV: 96 fL (ref 78.0–100.0)
PLATELETS: 232 10*3/uL (ref 150–400)
RBC: 3.97 MIL/uL (ref 3.87–5.11)
RDW: 12.7 % (ref 11.5–15.5)
WBC: 15.5 10*3/uL — ABNORMAL HIGH (ref 4.0–10.5)

## 2014-11-28 LAB — BASIC METABOLIC PANEL
Anion gap: 5 (ref 5–15)
BUN: 8 mg/dL (ref 6–20)
CALCIUM: 9.1 mg/dL (ref 8.9–10.3)
CO2: 22 mmol/L (ref 22–32)
CREATININE: 0.66 mg/dL (ref 0.44–1.00)
Chloride: 109 mmol/L (ref 101–111)
GFR calc non Af Amer: >60 mL/min (ref >60)
Glucose, Bld: 137 mg/dL — ABNORMAL HIGH (ref 65–99)
Potassium: 4.2 mmol/L (ref 3.5–5.1)
SODIUM: 136 mmol/L (ref 135–145)

## 2014-11-28 LAB — HIV ANTIBODY (ROUTINE TESTING W REFLEX): HIV SCREEN 4TH GENERATION: NONREACTIVE

## 2014-11-28 MED ORDER — IPRATROPIUM-ALBUTEROL 0.5-2.5 (3) MG/3ML IN SOLN
3.0000 mL | RESPIRATORY_TRACT | Status: DC
  Administered 2014-11-28 – 2014-11-29 (×9): 3 mL via RESPIRATORY_TRACT
  Filled 2014-11-28 (×10): qty 3

## 2014-11-28 MED ORDER — GUAIFENESIN-DM 100-10 MG/5ML PO SYRP: 5.0000 mL | ORAL_SOLUTION | ORAL | Status: DC | PRN

## 2014-11-28 NOTE — Progress Notes (Signed)
[facility] PROGRESS NOTE  Dawn Robertson ZOX:[id number] DOB: 12-May-1985 DOA: 11/27/2014 PCP: Pcp Not In System  Assessment/Plan: 1-Asthma Exacerbation:  Continue with IV solumedrol Q 8 hours. Albuterol, ipratropium  4 hours.  levaquin to cover for infection.  Will keep one more day for IV steroids and nebulizer, still with significant wheezing.   2-Hypokalemia; resolved.   3-Leukocytosis in setting of steroid.   Code Status: full code.  Family Communication: mother at bedside.  Disposition Plan: remain inpatient.    Consultants:  none  Procedures:  none  Antibiotics:  levaquin  HPI/Subjective: Feeling better but still with a lot of wheezing.   Objective: Filed Vitals:   11/28/14 1340  BP: 121/71  Pulse: 101  Temp:   Resp:     Intake/Output Summary (Last 24 hours) at 11/28/14 1745 Last data filed at 11/28/14 0536  Gross per 24 hour  Intake 1367.75 ml  Output      4 ml  Net 1363.75 ml   Filed Weights   11/27/14 1708  Weight: 73.029 kg (161 lb)    Exam:   General:  Alert in no distress.   Cardiovascular: S 1, S 2 RRR  Respiratory: Bilateral wheezing.   Abdomen: Bs present, soft, nt  Musculoskeletal: no edema  Data Reviewed: Basic Metabolic Panel:  Recent Labs Lab 11/27/14 1259 11/28/14 0505  NA 136 136  K 3.1* 4.2  CL 104 109  CO2 21* 22  GLUCOSE 105* 137*  BUN 7 8  CREATININE 0.68 0.66  CALCIUM 8.6* 9.1   Liver Function Tests: No results for input(s): AST, ALT, ALKPHOS, BILITOT, PROT, ALBUMIN in the last 168 hours. No results for input(s): LIPASE, AMYLASE in the last 168 hours. No results for input(s): AMMONIA in the last 168 hours. CBC:  Recent Labs Lab 11/27/14 1259 11/28/14 0505  WBC 8.6 15.5*  HGB 13.2 13.1  HCT 38.5 38.1  MCV 95.3 96.0  PLT 205 232   Cardiac Enzymes: No results for input(s): CKTOTAL, CKMB, CKMBINDEX, TROPONINI in the last 168 hours. BNP (last 3 results) No results for input(s): BNP  in the last 8760 hours.  ProBNP (last 3 results) No results for input(s): PROBNP in the last 8760 hours.  CBG: No results for input(s): GLUCAP in the last 168 hours.  No results found for this or any previous visit (from the past 240 hour(s)).   Studies: Dg Chest 2 View  11/27/2014   CLINICAL DATA:  Shortness of breath for 3 days  EXAM: CHEST  2 VIEW  COMPARISON:  July 15, 2014  FINDINGS: Lungs are clear. Heart size and pulmonary vascularity are normal. No adenopathy. No bone lesions.  IMPRESSION: No abnormality noted.   Electronically Signed   By: Bretta Bang III M.D.   On: 11/27/2014 14:37    Scheduled Meds: . docusate sodium  100 mg Oral BID  . enoxaparin (LOVENOX) injection  40 mg Subcutaneous Q24H  . ipratropium-albuterol  3 mL Nebulization Q4H  . levofloxacin  750 mg Oral Daily  . loratadine  10 mg Oral Daily  . methylPREDNISolone (SOLU-MEDROL) injection  60 mg Intravenous 3 times per day   Continuous Infusions:   Active Problems:   Asthma exacerbation   Asthma    Time spent: 35 minutes.     Hartley Barefoot A  [facility] Pager [phone]. If 7PM-7AM, please contact night-coverage at [email or web address], password [facility] 11/28/2014, 5:45 PM

## 2014-11-29 ENCOUNTER — Observation Stay (HOSPITAL_COMMUNITY): Payer: Self-pay

## 2014-11-29 MED ORDER — PREDNISONE 20 MG PO TABS: ORAL_TABLET | ORAL | Status: DC

## 2014-11-29 MED ORDER — ALBUTEROL SULFATE HFA 108 (90 BASE) MCG/ACT IN AERS: 2.0000 | INHALATION_SPRAY | Freq: Four times a day (QID) | RESPIRATORY_TRACT | Status: DC | PRN

## 2014-11-29 MED ORDER — BENZONATATE 100 MG PO CAPS: 100.0000 mg | ORAL_CAPSULE | Freq: Three times a day (TID) | ORAL | Status: DC | PRN

## 2014-11-29 MED ORDER — LEVOFLOXACIN 750 MG PO TABS: 750.0000 mg | ORAL_TABLET | Freq: Every day | ORAL | Status: DC

## 2014-11-29 MED ORDER — GUAIFENESIN ER 600 MG PO TB12
1200.0000 mg | ORAL_TABLET | Freq: Two times a day (BID) | ORAL | Status: DC
  Administered 2014-11-29: 1200 mg via ORAL
  Filled 2014-11-29: qty 2

## 2014-11-29 MED ORDER — IPRATROPIUM-ALBUTEROL 0.5-2.5 (3) MG/3ML IN SOLN: 3.0000 mL | RESPIRATORY_TRACT | Status: DC

## 2014-11-29 MED ORDER — BECLOMETHASONE DIPROPIONATE 40 MCG/ACT IN AERS: 2.0000 | INHALATION_SPRAY | Freq: Two times a day (BID) | RESPIRATORY_TRACT | Status: DC

## 2014-11-29 NOTE — Discharge Summary (Signed)
Physician Discharge Summary  Dawn Robertson LKG:[id number] DOB: 1985-12-05 DOA: 11/27/2014  PCP: Pcp Not In System  Admit date: 11/27/2014 Discharge date: 11/29/2014  Time spent: 35 minutes  Recommendations for Outpatient Follow-up:  Needs to follow up with PCP for further controlled of asthma.   Discharge Diagnoses:    Asthma exacerbation   Asthma   Discharge Condition: stable.   Diet recommendation:heart healthy  Filed Weights   11/27/14 1708  Weight: 73.029 kg (161 lb)    History of present illness:  Dawn Robertson is a 29 y.o. female complaining of SOB, productive cough, yellow sputum, for last 3 to 4 days. Sick contact , her mother has been coughing too. She has also ran out of her albuterol. She denies smoking. Denies chest pain. Feeling a little better after nebulizer treatments.  Denies abdominal pain, nausea, vomiting, chest pain. Relates subjective fevers.   Hospital Course:  1-Asthma Exacerbation:  Received  IV solumedrol Q 8 hours while in the hospital. Discharge on prednisone taper.  Albuterol, ipratropium 4 hours.  levaquin to cover for infection.   2-Hypokalemia; resolved.   3-Leukocytosis in setting of steroid.   Procedures:  none  Consultations:  none  Discharge Exam: Filed Vitals:   11/29/14 0501  BP: 119/74  Pulse: 91  Temp: 98.6 F (37 C)  Resp: 16    General: Alert in no distress.  Cardiovascular: S 1, S 2 RRR Respiratory: CTA  Discharge Instructions   Discharge Instructions    Diet - low sodium heart healthy    Complete by:  As directed      Increase activity slowly    Complete by:  As directed           Current Discharge Medication List    START taking these medications   Details  ipratropium-albuterol (DUONEB) 0.5-2.5 (3) MG/3ML SOLN Take 3 mLs by nebulization every 4 (four) hours. Qty: 360 mL, Refills: 2    levofloxacin (LEVAQUIN) 750 MG tablet Take 1 tablet (750 mg total) by mouth daily. Qty: 5 tablet,  Refills: 0      CONTINUE these medications which have CHANGED   Details  albuterol (PROVENTIL HFA;VENTOLIN HFA) 108 (90 BASE) MCG/ACT inhaler Inhale 2 puffs into the lungs every 6 (six) hours as needed for wheezing or shortness of breath. Qty: 1 Inhaler, Refills: 2    beclomethasone (QVAR) 40 MCG/ACT inhaler Inhale 2 puffs into the lungs 2 (two) times daily. Qty: 1 Inhaler, Refills: 2    predniSONE (DELTASONE) 20 MG tablet Take 3 tablets for 4 days then 2 tablets for 3 days them 1 tablet for 2 days . Qty: 20 tablet, Refills: 0      CONTINUE these medications which have NOT CHANGED   Details  cetirizine (ZYRTEC) 10 MG tablet Take 10 mg by mouth daily.    diphenhydrAMINE (SOMINEX) 25 MG tablet Take 25 mg by mouth at bedtime as needed for sleep.    loratadine (CLARITIN) 10 MG tablet Take 1 tablet (10 mg total) by mouth daily. Qty: 30 tablet, Refills: 0      STOP taking these medications     naproxen sodium (ANAPROX) 220 MG tablet      albuterol (PROVENTIL) (2.5 MG/3ML) 0.083% nebulizer solution        No Known Allergies    The results of significant diagnostics from this hospitalization (including imaging, microbiology, ancillary and laboratory) are listed below for reference.    Significant Diagnostic Studies: Dg Chest 2 View  11/29/2014  CLINICAL DATA:  29 year old female with productive cough x1 week. Medical history includes asthma  EXAM: CHEST  2 VIEW  COMPARISON:  Prior chest x-ray 11/27/2014  FINDINGS: The lungs are clear and negative for focal airspace consolidation, pulmonary edema or suspicious pulmonary nodule. No pleural effusion or pneumothorax. Cardiac and mediastinal contours are within normal limits. No acute fracture or lytic or blastic osseous lesions. The visualized upper abdominal bowel gas pattern is unremarkable.  IMPRESSION: Negative chest x-ray.   Electronically Signed   By: Malachy Moan M.D.   On: 11/29/2014 10:42   Dg Chest 2 View  11/27/2014    CLINICAL DATA:  Shortness of breath for 3 days  EXAM: CHEST  2 VIEW  COMPARISON:  July 15, 2014  FINDINGS: Lungs are clear. Heart size and pulmonary vascularity are normal. No adenopathy. No bone lesions.  IMPRESSION: No abnormality noted.   Electronically Signed   By: Bretta Bang III M.D.   On: 11/27/2014 14:37    Microbiology: No results found for this or any previous visit (from the past 240 hour(s)).   Labs: Basic Metabolic Panel:  Recent Labs Lab 11/27/14 1259 11/28/14 0505  NA 136 136  K 3.1* 4.2  CL 104 109  CO2 21* 22  GLUCOSE 105* 137*  BUN 7 8  CREATININE 0.68 0.66  CALCIUM 8.6* 9.1   Liver Function Tests: No results for input(s): AST, ALT, ALKPHOS, BILITOT, PROT, ALBUMIN in the last 168 hours. No results for input(s): LIPASE, AMYLASE in the last 168 hours. No results for input(s): AMMONIA in the last 168 hours. CBC:  Recent Labs Lab 11/27/14 1259 11/28/14 0505  WBC 8.6 15.5*  HGB 13.2 13.1  HCT 38.5 38.1  MCV 95.3 96.0  PLT 205 232   Cardiac Enzymes: No results for input(s): CKTOTAL, CKMB, CKMBINDEX, TROPONINI in the last 168 hours. BNP: BNP (last 3 results) No results for input(s): BNP in the last 8760 hours.  ProBNP (last 3 results) No results for input(s): PROBNP in the last 8760 hours.  CBG: No results for input(s): GLUCAP in the last 168 hours.     Signed:  Hartley Barefoot A  [facility] 11/29/2014, 2:21 PM

## 2014-11-29 NOTE — Progress Notes (Signed)
Nursing Discharge Summary  Patient ID: Dawn Robertson MRN: [id number] DOB/AGE: [birth year] 29 y.o.  Admit date: 11/27/2014 Discharge date: 11/29/2014  Discharged Condition: good  Disposition: 01-Home or Self Care    Prescriptions Given: Prescriptions given.  Medications discussed.  Patient and mother at bedside verbalized understanding.  Means of Discharge: Patient taken downstairs via wheelchair to be discharged home.   Signed: Gloriajean Dell 11/29/2014, 3:33 PM

## 2014-11-29 NOTE — Progress Notes (Signed)
[facility] PROGRESS NOTE  Dawn Robertson ZOX:[id number] DOB: Aug 19, 1985 DOA: 11/27/2014 PCP: Pcp Not In System  Assessment/Plan: 1-Asthma Exacerbation:  Continue with IV solumedrol Q 8 hours. Albuterol, ipratropium  4 hours.  levaquin to cover for infection.  Continue with IV solumedrol.  Cough is worse. She has try in the past mucinex  but doesn't help. Will repeat chest x ray Agree to try mucinex. She had some reaction to robitussin in the past./ it might be to the dextromethorphan component.    2-Hypokalemia; resolved.   3-Leukocytosis in setting of steroid.   Code Status: full code.  Family Communication: mother at bedside.  Disposition Plan: remain inpatient.    Consultants:  none  Procedures:  none  Antibiotics:  levaquin  HPI/Subjective: She is feeling better. Coughing a lot, productive.   Objective: Filed Vitals:   11/29/14 0501  BP: 119/74  Pulse: 91  Temp: 98.6 F (37 C)  Resp: 16   No intake or output data in the 24 hours ending 11/29/14 1004 Filed Weights   11/27/14 1708  Weight: 73.029 kg (161 lb)    Exam:   General:  Alert in no distress.   Cardiovascular: S 1, S 2 RRR  Respiratory: Bilateral wheezing.   Abdomen: Bs present, soft, nt  Musculoskeletal: no edema  Data Reviewed: Basic Metabolic Panel:  Recent Labs Lab 11/27/14 1259 11/28/14 0505  NA 136 136  K 3.1* 4.2  CL 104 109  CO2 21* 22  GLUCOSE 105* 137*  BUN 7 8  CREATININE 0.68 0.66  CALCIUM 8.6* 9.1   Liver Function Tests: No results for input(s): AST, ALT, ALKPHOS, BILITOT, PROT, ALBUMIN in the last 168 hours. No results for input(s): LIPASE, AMYLASE in the last 168 hours. No results for input(s): AMMONIA in the last 168 hours. CBC:  Recent Labs Lab 11/27/14 1259 11/28/14 0505  WBC 8.6 15.5*  HGB 13.2 13.1  HCT 38.5 38.1  MCV 95.3 96.0  PLT 205 232   Cardiac Enzymes: No results for input(s): CKTOTAL, CKMB, CKMBINDEX, TROPONINI in the  last 168 hours. BNP (last 3 results) No results for input(s): BNP in the last 8760 hours.  ProBNP (last 3 results) No results for input(s): PROBNP in the last 8760 hours.  CBG: No results for input(s): GLUCAP in the last 168 hours.  No results found for this or any previous visit (from the past 240 hour(s)).   Studies: Dg Chest 2 View  11/27/2014   CLINICAL DATA:  Shortness of breath for 3 days  EXAM: CHEST  2 VIEW  COMPARISON:  July 15, 2014  FINDINGS: Lungs are clear. Heart size and pulmonary vascularity are normal. No adenopathy. No bone lesions.  IMPRESSION: No abnormality noted.   Electronically Signed   By: Bretta Bang III M.D.   On: 11/27/2014 14:37    Scheduled Meds: . docusate sodium  100 mg Oral BID  . enoxaparin (LOVENOX) injection  40 mg Subcutaneous Q24H  . ipratropium-albuterol  3 mL Nebulization Q4H  . levofloxacin  750 mg Oral Daily  . loratadine  10 mg Oral Daily  . methylPREDNISolone (SOLU-MEDROL) injection  60 mg Intravenous 3 times per day   Continuous Infusions:   Active Problems:   Asthma exacerbation   Asthma    Time spent: 35 minutes.     Hartley Barefoot A  [facility] Pager [phone]. If 7PM-7AM, please contact night-coverage at [email or web address], password [facility] 11/29/2014, 10:04 AM

## 2014-11-29 NOTE — Progress Notes (Signed)
Patient called back to say that she couldn't afford her medications that she received prescriptions for.  She couldn't afford her Levaquin, QVar, or Duoneb.  Attempted to call SW and CM to see if they could get her in the [facility] program. Unable to contact anyone due to the time patient called back.  Encouraged patient to go to [email or web address].com where she can get coupons to get her medications at a cheaper price.  Patient instructed that she can call back to the unit in the morning to speak with the charge nurse who can then get in contact with SW or CM.

## 2014-11-30 NOTE — Care Management Note (Signed)
Case Management Note  Patient Details  Name: Dawn Robertson MRN: [id number] Date of Birth: [birth year]  Subjective/Objective:      Asthma exacerbation              Action/Plan: Medication assistance. NCM contacted pt via phone, # [phone]. States her meds are running over $200 combined. She was able to get inhaler, and prednisone. She goes to [facility] on [address]. She has neb machine at home. Will use MATCH for pt. Explained MATCH program to pt and that it can be used once per year. States she has been trying to get an appt at [facility] since May. She has followed at Clinic once in the past. Pt request to use [facility] on [address] # [phone] fax [phone].  Call to [facility] to give info on [facility] letter faxed to pharmacy and pt will bring her Rx in to have filled using the $3 copay.    Expected Discharge Date:  11/29/2014               Expected Discharge Plan:  Home/Self Care  In-House Referral:     Discharge planning Services  CM Consult, Medication Assistance, MATCH Program   Status of Service:  Completed, signed off  Medicare Important Message Given:    Date Medicare IM Given:    Medicare IM give by:    Date Additional Medicare IM Given:    Additional Medicare Important Message give by:     If discussed at Long Length of Stay Meetings, dates discussed:    Additional Comments:  Elliot Cousin, RN 11/30/2014, 7:03 PM

## 2015-04-02 ENCOUNTER — Emergency Department (HOSPITAL_COMMUNITY)
Admission: EM | Admit: 2015-04-02 | Discharge: 2015-04-02 | Disposition: A | Discharge status: Home / Self Care | Payer: Self-pay | Attending: Emergency Medicine | Admitting: Emergency Medicine

## 2015-04-02 ENCOUNTER — Encounter (HOSPITAL_COMMUNITY): Payer: Self-pay | Admitting: Emergency Medicine

## 2015-04-02 DIAGNOSIS — Z87891 Personal history of nicotine dependence: Secondary | ICD-10-CM | POA: Insufficient documentation

## 2015-04-02 DIAGNOSIS — Z792 Long term (current) use of antibiotics: Secondary | ICD-10-CM | POA: Insufficient documentation

## 2015-04-02 DIAGNOSIS — K002 Abnormalities of size and form of teeth: Secondary | ICD-10-CM | POA: Insufficient documentation

## 2015-04-02 DIAGNOSIS — R590 Localized enlarged lymph nodes: Secondary | ICD-10-CM | POA: Insufficient documentation

## 2015-04-02 DIAGNOSIS — Z79899 Other long term (current) drug therapy: Secondary | ICD-10-CM | POA: Insufficient documentation

## 2015-04-02 DIAGNOSIS — Z7951 Long term (current) use of inhaled steroids: Secondary | ICD-10-CM | POA: Insufficient documentation

## 2015-04-02 DIAGNOSIS — K0889 Other specified disorders of teeth and supporting structures: Secondary | ICD-10-CM | POA: Insufficient documentation

## 2015-04-02 DIAGNOSIS — J45909 Unspecified asthma, uncomplicated: Secondary | ICD-10-CM | POA: Insufficient documentation

## 2015-04-02 DIAGNOSIS — J029 Acute pharyngitis, unspecified: Secondary | ICD-10-CM | POA: Insufficient documentation

## 2015-04-02 MED ORDER — ACETAMINOPHEN 325 MG PO TABS
650.0000 mg | ORAL_TABLET | Freq: Once | ORAL | Status: AC
  Administered 2015-04-02: 650 mg via ORAL
  Filled 2015-04-02: qty 2

## 2015-04-02 MED ORDER — NAPROXEN 250 MG PO TABS: 250.0000 mg | ORAL_TABLET | Freq: Two times a day (BID) | ORAL | Status: DC

## 2015-04-02 MED ORDER — PENICILLIN V POTASSIUM 500 MG PO TABS
500.0000 mg | ORAL_TABLET | Freq: Four times a day (QID) | ORAL | Status: DC
Start: 2015-04-02 — End: 2015-06-08

## 2015-04-02 NOTE — ED Notes (Signed)
Per pt, states left throat pain-states she thought it was dental pain

## 2015-04-02 NOTE — ED Provider Notes (Signed)
CSN: [id number]     Arrival date & time 04/02/15  1423 History  By signing my name below, I, Lyndel Safe, attest that this documentation has been prepared under the direction and in the presence of  Everlene Farrier PA-C.  Electronically Signed: Lyndel Safe, ED Scribe. 04/02/2015. 3:12 PM.   Chief Complaint  Patient presents with  . throat pain    The history is provided by the patient. No language interpreter was used.   HPI Comments: Dawn Robertson is a 30 y.o. female, with a PMhx of asthma and seasonal allergies, who presents to the Emergency Department complaining of a constant, sore throat that is exacerbated with swallowing X 1 day, which she attributes to left-sided dental pain that is radiating throughout her left mandible onset 1 day ago. She has been applying Orajel without significant relief. Pt is able to tolerate secretions without difficulty and denies fevers or chills, drainage from dentition, inability to move neck, drooling, otalgia, visual disturbances, double vision, nausea, vomiting, or diarrhea. Pt is not followed by a dentist.   Past Medical History  Diagnosis Date  . Asthma     seasonal  . Shortness of breath   . Tobacco abuse   . Seasonal allergies    History reviewed. No pertinent past surgical history. Family History  Problem Relation Age of Onset  . Cancer Mother     breast  . Asthma Mother   . Asthma Father   . Diabetes type II Paternal Aunt    Social History  Substance Use Topics  . Smoking status: Former Games developer  . Smokeless tobacco: Never Used  . Alcohol Use: 0.0 oz/week    0 Standard drinks or equivalent per week     Comment: daily   OB History    No data available     Review of Systems  Constitutional: Negative for fever and chills.  HENT: Positive for dental problem and sore throat. Negative for drooling, ear pain, facial swelling, postnasal drip, trouble swallowing and voice change.   Eyes: Negative for visual disturbance.   Gastrointestinal: Negative for nausea, vomiting and diarrhea.  Musculoskeletal: Negative for neck pain and neck stiffness.  Skin: Negative for rash and wound.  Neurological: Negative for light-headedness.   Allergies  Review of patient's allergies indicates no known allergies.  Home Medications   Prior to Admission medications   Medication Sig Start Date End Date Taking? Authorizing Provider  albuterol (PROVENTIL HFA;VENTOLIN HFA) 108 (90 BASE) MCG/ACT inhaler Inhale 2 puffs into the lungs every 6 (six) hours as needed for wheezing or shortness of breath. 11/29/14   Belkys A Regalado, MD  beclomethasone (QVAR) 40 MCG/ACT inhaler Inhale 2 puffs into the lungs 2 (two) times daily. 11/29/14   Belkys A Regalado, MD  cetirizine (ZYRTEC) 10 MG tablet Take 10 mg by mouth daily.    Historical Provider, MD  diphenhydrAMINE (SOMINEX) 25 MG tablet Take 25 mg by mouth at bedtime as needed for sleep.    Historical Provider, MD  ipratropium-albuterol (DUONEB) 0.5-2.5 (3) MG/3ML SOLN Take 3 mLs by nebulization every 4 (four) hours. 11/29/14   Belkys A Regalado, MD  levofloxacin (LEVAQUIN) 750 MG tablet Take 1 tablet (750 mg total) by mouth daily. 11/29/14   Belkys A Regalado, MD  loratadine (CLARITIN) 10 MG tablet Take 1 tablet (10 mg total) by mouth daily. Patient not taking: Reported on 11/27/2014 07/17/14   Osvaldo Shipper, MD  naproxen (NAPROSYN) 250 MG tablet Take 1 tablet (250 mg total) by  mouth 2 (two) times daily with a meal. 04/02/15   Everlene Farrier, PA-C  penicillin v potassium (VEETID) 500 MG tablet Take 1 tablet (500 mg total) by mouth 4 (four) times daily. 04/02/15   Everlene Farrier, PA-C  predniSONE (DELTASONE) 20 MG tablet Take 3 tablets for 4 days then 2 tablets for 3 days them 1 tablet for 2 days . 11/29/14   Belkys A Regalado, MD   BP 133/87 mmHg  Pulse 98  Temp(Src) 98.8 F (37.1 C) (Oral)  Resp 18  SpO2 100%  LMP 03/02/2015 Physical Exam  Constitutional: She is oriented to person, place, and  time. Vital signs are normal. She appears well-developed and well-nourished.  Non-toxic appearance. No distress.  Afebrile, nontoxic, NAD  HENT:  Head: Normocephalic and atraumatic.  Right Ear: External ear normal.  Left Ear: External ear normal.  Mouth/Throat: Uvula is midline and mucous membranes are normal. No trismus in the jaw. Abnormal dentition. No oropharyngeal exudate or tonsillar abscesses.  Mild bilateral tonsillar hypertrophy, with left being greater than right; no exudates. No trismus. Uvula midline without edema. There are 2 cracked left lower molars that are TTP. No discharge. No abscesses. No peritonsillar abscess. Tongue protrusion is normal.   Bilateral tympanic membranes are pearly-gray without erythema or loss of landmarks.    Eyes: Conjunctivae and EOM are normal. Pupils are equal, round, and reactive to light. Right eye exhibits no discharge. Left eye exhibits no discharge.  Neck: Normal range of motion. Neck supple. No JVD present. No tracheal deviation present.  Mild reactive submandibular lymphadenopathy.  Cardiovascular: Normal rate, regular rhythm, normal heart sounds and intact distal pulses.   Pulmonary/Chest: Effort normal and breath sounds normal. No respiratory distress. She has no wheezes. She has no rales.  Abdominal: Soft. Normal appearance. There is no tenderness.  Musculoskeletal: Normal range of motion.  Lymphadenopathy:    She has no cervical adenopathy.  Neurological: She is alert and oriented to person, place, and time. She has normal strength. No cranial nerve deficit or sensory deficit. Coordination normal.  Skin: Skin is warm, dry and intact. No rash noted. No erythema. No pallor.  Psychiatric: She has a normal mood and affect. Her behavior is normal.  Nursing note and vitals reviewed.   ED Course  Procedures  DIAGNOSTIC STUDIES: Oxygen Saturation is 100% on RA, normal by my interpretation.    COORDINATION OF CARE: 3:10 PM Discussed treatment  plan which includes to prescribe antibiotic course and give dental referral with pt. Pt acknowledges and agrees to plan.    MDM   Meds given in ED:  Medications  acetaminophen (TYLENOL) tablet 650 mg (not administered)    New Prescriptions   NAPROXEN (NAPROSYN) 250 MG TABLET    Take 1 tablet (250 mg total) by mouth 2 (two) times daily with a meal.   PENICILLIN V POTASSIUM (VEETID) 500 MG TABLET    Take 1 tablet (500 mg total) by mouth 4 (four) times daily.    Final diagnoses:  Pain, dental   This  is a 30 y.o. female, with a PMhx of asthma and seasonal allergies, who presents to the Emergency Department complaining of a constant, sore throat that is exacerbated with swallowing X 1 day, which she attributes to left-sided dental pain that is radiating throughout her left mandible onset 1 day ago. She reports she has tried all morning to get an illness but was unable to she is taking nothing for treatment today. On exam the patient is afebrile  nontoxic appearing. She does have tenderness to her left lower cracked teeth. She has mild bilateral tonsillar hypertrophy without exudates. No trismus. No peritonsillar abscess. Uvula is midline without edema. She tolerated by mouth without difficulty. She has good and full range of motion of her neck. Patient with toothache.  No gross abscess.  Exam unconcerning for Ludwig's angina or spread of infection.  Will treat with penicillin and pain medicine.  Urged patient to follow-up with dentist.  I provided dental resource list. I advised the patient to follow-up with their primary care provider this week. I advised the patient to return to the emergency department with new or worsening symptoms or new concerns. The patient verbalized understanding and agreement with plan.    I personally performed the services described in this documentation, which was scribed in my presence. The recorded information has been reviewed and is accurate.         Everlene FarrierWilliam  Carmell Elgin, PA-C 04/02/15 1524  Azalia BilisKevin Campos, MD 04/02/15 [phone]

## 2015-04-02 NOTE — Discharge Instructions (Signed)
Dental Pain  ° ° °Dental pain may be caused by many things, including:  °Tooth decay (cavities or caries). Cavities expose the nerve of your tooth to air and hot or cold temperatures. This can cause pain or discomfort.  °Abscess or infection. A dental abscess is a collection of infected pus from a bacterial infection in the inner part of the tooth (pulp). It usually occurs at the end of the tooth's root.  °Injury.  °An unknown reason (idiopathic). °Your pain may be mild or severe. It may only occur when:  °You are chewing.  °You are exposed to hot or cold temperature.  °You are eating or drinking sugary foods or beverages, such as soda or candy. °Your pain may also be constant.  °HOME CARE INSTRUCTIONS  °Watch your dental pain for any changes. The following actions may help to lessen any discomfort that you are feeling:  °Take medicines only as directed by your dentist.  °If you were prescribed an antibiotic medicine, finish all of it even if you start to feel better.  °Keep all follow-up visits as directed by your dentist. This is important.  °Do not apply heat to the outside of your face.  °Rinse your mouth or gargle with salt water if directed by your dentist. This helps with pain and swelling.  °You can make salt water by adding ¼ tsp of salt to 1 cup of warm water. °Apply ice to the painful area of your face:  °Put ice in a plastic bag.  °Place a towel between your skin and the bag.  °Leave the ice on for 20 minutes, 2-3 times per day. °Avoid foods or drinks that cause you pain, such as:  °Very hot or very cold foods or drinks.  °Sweet or sugary foods or drinks. °SEEK MEDICAL CARE IF:  °Your pain is not controlled with medicines.  °Your symptoms are worse.  °You have new symptoms. °SEEK IMMEDIATE MEDICAL CARE IF:  °You are unable to open your mouth.  °You are having trouble breathing or swallowing.  °You have a fever.  °Your face, neck, or jaw is swollen. °This information is not intended to replace advice  given to you by your health care provider. Make sure you discuss any questions you have with your health care provider.  °Document Released: 03/14/2005 Document Revised: 07/29/2014 Document Reviewed: 03/10/2014  °Elsevier Interactive Patient Education ©2016 Elsevier Inc.  ° ° °Emergency Department Resource Guide °1) Find a Doctor and Pay Out of Pocket °Although you won't have to find out who is covered by your insurance plan, it is a good idea to ask around and get recommendations. You will then need to call the office and see if the doctor you have chosen will accept you as a new patient and what types of options they offer for patients who are self-pay. Some doctors offer discounts or will set up payment plans for their patients who do not have insurance, but you will need to ask so you aren't surprised when you get to your appointment. ° °2) Contact Your Local Health Department °Not all health departments have doctors that can see patients for sick visits, but many do, so it is worth a call to see if yours does. If you don't know where your local health department is, you can check in your phone book. The CDC also has a tool to help you locate your state's health department, and many state websites also have listings of all of their local health departments. ° °  3) Find a Walk-in Clinic °If your illness is not likely to be very severe or complicated, you may want to try a walk in clinic. These are popping up all over the country in pharmacies, drugstores, and shopping centers. They're usually staffed by nurse practitioners or physician assistants that have been trained to treat common illnesses and complaints. They're usually fairly quick and inexpensive. However, if you have serious medical issues or chronic medical problems, these are probably not your best option. ° °No Primary Care Doctor: °- Call Health Connect at  832-8000 - they can help you locate a primary care doctor that  accepts your insurance,  provides certain services, etc. °- Physician Referral Service- 1-800-533-3463 ° °Chronic Pain Problems: °Organization         Address  Phone   Notes  °[facility]  (336) 297-2271 Patients need to be referred by their primary care doctor.  ° °Medication Assistance: °Organization         Address  Phone   Notes  °[place] [facility] [address] °[place], [place] [address] (336) 641-8030 --Must be a resident of [place] °-- Must have NO insurance coverage whatsoever (no Medicaid/ Medicare, etc.) °-- The pt. MUST have a primary care doctor that directs their care regularly and follows them in the community °  °[facility]  (866) 331-1348   °[employer]  (888) 892-1162   ° °Agencies that provide inexpensive medical care: °Organization         Address  Phone   Notes  °[facility]  (336) 832-8035   °[facility]    (336) 832-7272   °[facility] [address] °[place], [place] [address] (336) 832-4777   °[facility] [address], °[place] (336) 271-4999   °[facility]    (336) 373-0678   °[facility]    (336) 272-1050   °[facility] ° [address], [place] Phone:  (336) 832-4444, Fax:  (336) 832-4440 Hours of Operation:  9 am - 6 pm, M-F.  Also accepts Medicaid/Medicare and self-pay.  °[facility] ° [address], [place] Phone: (336) 832-3150, Fax: (336) [phone]. Hours of Operation:  8:30 am - 5:30 pm, M-F.  Also accepts Medicaid and self-pay.  °[facility] [place] [address], [place] Phone: (336) 878-6027   °[facility] [address], [place],  (336)723-1848, Ext. 123 Mondays & Thursdays: 7-9 AM.  First 15 patients are seen on a first come, first serve basis. °  ° °Medicaid-accepting [place] Providers: ° °Organization         Address  Phone    Notes  °[facility] [address], [place] (336) 641-2100 Also accepts self-pay patients.  °[facility] [address], [place] ° (336) 856-9996   °[facility] [address], [place] (336) 288-8857   °[facility] [address] [place] [address], [place] (336) 299-7000   °Veita Bland [address],   ° (336) 373-1557 Only accepts [place] Access Medicaid patients after they have their name applied to their card.  ° °Self-Pay (no insurance) in [place]: ° °Organization         Address  Phone   Notes  °Sickle Cell Patients, [facility] [address], [place] (336) 832-1970   °[facility] [address], [place] (336) 832-4400   °[facility] ° [address], [place] (336) 992-4800   °[facility]/Dr. Osei-Bonsu ° [address] [place] [address], [place] or [address], [place] (336) 841-8500 Phone number for both [place] and [place] locations is the same.  °[facility] [address], [place] (336) 299-0000   °[facility] [address] [place] [address], [place] or [address] (336) 852-7530 °(336) 878-2260   °[facility] [address], [place] (336) 350-1642, phone; (336) 294-5005, fax Sees patients 1st and 3rd Saturday of every month.  Must not qualify for public or private insurance (i.e. Medicaid, Medicare, [place] Health Choice, Veterans' Benefits) • Household income should be no more than 200% of the poverty level •The clinic cannot treat you if you are pregnant or think you are pregnant • Sexually transmitted diseases are not treated at the clinic.  ° ° °Dental Care: °Organization         Address  Phone  Notes  °[place] [facility] [address], [place] (336)  641-6152 Accepts children up to age 21 who are enrolled in Medicaid or [place] Health Choice; pregnant women with a Medicaid card; and children who have applied for Medicaid or [place] Health Choice, but were declined, whose parents can pay a reduced fee at time of service.  °[facility] [place]  [address], [place] (336) 641-7733 Accepts children up to age 21 who are enrolled in Medicaid or [place] Health Choice; pregnant women with a Medicaid card; and children who have applied for Medicaid or [place] Health Choice, but were declined, whose parents can pay a reduced fee at time of service.  °[facility] ° [address], [place] (336) 641-4533 Patients are seen by appointment only. Walk-ins are not accepted. [facility] will see patients 18 years of age and older. °Monday - Tuesday (8am-5pm) °Most Wednesdays (8:30-5pm) °$30 per visit, cash only  °[facility] ° [address], [place] (336) 641-4533 Patients are seen by appointment only. Walk-ins are not accepted. [facility] will see patients 18 years of age and older. °One Wednesday Evening (Monthly: Volunteer Based).  $30 per visit, cash only  °[facility]  (919) 537-3737 for adults; Children under age 4, call Graduate Pediatric Dentistry at (919) [phone]. Children aged 4-14, please call (919) [phone] to request a pediatric application. ° Dental services are provided in all areas of dental care including fillings, crowns and bridges, complete and partial dentures, implants, gum treatment, root canals, and extractions. Preventive care is also provided. Treatment is provided to both adults and children. °Patients are selected via a lottery and there is often a waiting list. °  °[facility] [address], °[place] ° (336) 763-8833 [email or web address] °  °[facility] [address], [place], [place] (336)723-1848, Ext.  123 Second and Fourth Thursday of each month, opens at 6:30 AM; Clinic ends at 9 AM.  Patients are seen on a first-come first-served basis, and a limited number are seen during each clinic.  ° °[facility] ° [address], [place], [place] (336) 723-7904   Eligibility Requirements °You must have lived in [place],   [place], or [place] counties for at least the last three months. °  You cannot be eligible for state or federal sponsored healthcare insurance, including Veterans Administration, Medicaid, or Medicare. °  You generally cannot be eligible for healthcare insurance through your employer.  °  How to apply: °Eligibility screenings are held every Tuesday and Wednesday afternoon from 1:00 pm until 4:00 pm. You do not need an appointment for the interview!  °[facility] [address], [place], [place] 336-631-2330   °[facility] [facility]  336-342-8273   °[place]  336-703-3100   °[facility]  336-570-6415   ° °Behavioral Health Resources in the Community: °Intensive Outpatient Programs °Organization         Address  Phone  Notes  °[facility] [address], [place], [place] 336-878-6098   °[facility] [address], [place], [place] 336-832-9800   °[facility] [address], [place], [place] ° 336-882-2125   °[facility] [address],  °[place], [place] 1-800-853-5163 or 336-641-4981   °Substance Abuse Resources °Organization         Address  Phone  Notes  °[facility]  336-882-2125   °[facility]  336-784-9470   °[facility]  336-285-9073   °[facility]  336-845-3988   °Residential & Outpatient Substance Abuse Program  1-800-659-3381   °Psychological Services °Organization         Address  Phone  Notes  °[facility]  336- 832-9600   °[facility]  336- 378-7881   °[place] [address], [place] 1-800-853-5163 or 336-641-4981   ° °Mobile Crisis Teams °Organization         Address  Phone  Notes  °[facility], Mobile Crisis Care Unit  1-877-626-1772   °Assertive °[facility] ° [address] [place], [place] 336-834-9664   °Sharon DeEsch [address] °[place] 336-554-5454   ° °Self-Help/Support Groups °Organization         Address  Phone             Notes  °[facility] [place] - variety of support groups  336- 373-1402 Call for more information  °Narcotics Anonymous (NA), [facility] [address], °[place]  2 meetings at this location  ° °Residential Treatment Programs °Organization         Address  Phone  Notes  °[facility] [address],    °[place]  1-866-801-8205   °[facility] ° [address], [place], [place] 704-293-8524   °[facility] [address], [place] 336-845-3988 Admissions: 8am-3pm M-F  °[facility] [address]    °[place], [place] 336-841-1104   °[facility] [address], [place], [place] 336-379-7146   °[facility] [address]  °[place], [place] 336-285-9073   °[facility] Intensive Outpatient [address], [place], [place] 336-852-3033   °[facility] [address]  °[place], [place] 1-877-615-2722 or 336-784-9470   °[facility] [address] [place], [place] 336-227-7417 Accepts Medicaid  °[facility] [address]  °[place]  1-800-659-3381 Substance Abuse/Addiction Treatment  ° °[place] Behavioral Health Resources °Organization         Address  Phone  Notes  °[facility]  (888) 581-9988   °Julie Brannon, PhD [address] [place], [place]   (336) 349-5553 or (336) 951-0000   °[facility]   [address] °[place], [place] (336) 349-4454   °[facility] [address], [place], [place] (336) 342-8316 Insurance/Medicaid/sponsorship through [facility]  °[facility] [address]                                    [place], [place] (336) 342-8316 Therapy/tele-psych/case  °[facility] [address]  ° [place], [place] (336) 349-2233    °Dr. Arfeen  (336) 349-4544   °[facility] [place]  [employer] [place] [facility] 1) [address], [place] °2) [address], [place] °3)  [address], [place] (336) 349-3220 °(336) 342-7768 ° °(336) 342-8140   °[place] Child Abuse Hotline (336) 342-1394 or (336) 342-3537 (After Hours)    ° ° ° °

## 2015-06-02 ENCOUNTER — Encounter (HOSPITAL_COMMUNITY): Payer: Self-pay | Admitting: Emergency Medicine

## 2015-06-02 ENCOUNTER — Emergency Department (HOSPITAL_COMMUNITY)
Admission: EM | Admit: 2015-06-02 | Discharge: 2015-06-02 | Disposition: A | Discharge status: Home / Self Care | Payer: Self-pay | Attending: Emergency Medicine | Admitting: Emergency Medicine

## 2015-06-02 ENCOUNTER — Emergency Department (HOSPITAL_COMMUNITY): Payer: Self-pay

## 2015-06-02 DIAGNOSIS — J4521 Mild intermittent asthma with (acute) exacerbation: Secondary | ICD-10-CM | POA: Insufficient documentation

## 2015-06-02 DIAGNOSIS — Z791 Long term (current) use of non-steroidal anti-inflammatories (NSAID): Secondary | ICD-10-CM | POA: Insufficient documentation

## 2015-06-02 DIAGNOSIS — R059 Cough, unspecified: Secondary | ICD-10-CM

## 2015-06-02 DIAGNOSIS — J029 Acute pharyngitis, unspecified: Secondary | ICD-10-CM

## 2015-06-02 DIAGNOSIS — Z79899 Other long term (current) drug therapy: Secondary | ICD-10-CM | POA: Insufficient documentation

## 2015-06-02 DIAGNOSIS — Z87891 Personal history of nicotine dependence: Secondary | ICD-10-CM | POA: Insufficient documentation

## 2015-06-02 DIAGNOSIS — Z7951 Long term (current) use of inhaled steroids: Secondary | ICD-10-CM | POA: Insufficient documentation

## 2015-06-02 DIAGNOSIS — R05 Cough: Secondary | ICD-10-CM

## 2015-06-02 LAB — RAPID STREP SCREEN (MED CTR MEBANE ONLY): Streptococcus, Group A Screen (Direct): NEGATIVE

## 2015-06-02 MED ORDER — BENZONATATE 100 MG PO CAPS: 200.0000 mg | ORAL_CAPSULE | Freq: Two times a day (BID) | ORAL | Status: DC | PRN

## 2015-06-02 MED ORDER — PREDNISONE 20 MG PO TABS: 40.0000 mg | ORAL_TABLET | Freq: Every day | ORAL | Status: DC

## 2015-06-02 MED ORDER — OSELTAMIVIR PHOSPHATE 75 MG PO CAPS: 75.0000 mg | ORAL_CAPSULE | Freq: Two times a day (BID) | ORAL | Status: DC

## 2015-06-02 MED ORDER — ACETAMINOPHEN 500 MG PO TABS
1000.0000 mg | ORAL_TABLET | Freq: Once | ORAL | Status: AC
  Administered 2015-06-02: 1000 mg via ORAL
  Filled 2015-06-02: qty 2

## 2015-06-02 MED ORDER — PREDNISONE 20 MG PO TABS
60.0000 mg | ORAL_TABLET | Freq: Once | ORAL | Status: AC
  Administered 2015-06-02: 60 mg via ORAL
  Filled 2015-06-02: qty 3

## 2015-06-02 MED ORDER — OXYMETAZOLINE HCL 0.05 % NA SOLN: 1.0000 | Freq: Two times a day (BID) | NASAL | Status: DC

## 2015-06-02 MED ORDER — ALBUTEROL (5 MG/ML) CONTINUOUS INHALATION SOLN
10.0000 mg/h | INHALATION_SOLUTION | RESPIRATORY_TRACT | Status: DC
  Administered 2015-06-02: 10 mg/h via RESPIRATORY_TRACT
  Filled 2015-06-02: qty 20

## 2015-06-02 MED ORDER — IPRATROPIUM-ALBUTEROL 0.5-2.5 (3) MG/3ML IN SOLN
3.0000 mL | Freq: Once | RESPIRATORY_TRACT | Status: AC
  Administered 2015-06-02: 3 mL via RESPIRATORY_TRACT
  Filled 2015-06-02: qty 3

## 2015-06-02 NOTE — ED Notes (Signed)
Patient transported to X-ray 

## 2015-06-02 NOTE — ED Provider Notes (Signed)
CSN: [id number]     Arrival date & time 06/02/15  0856 History   First MD Initiated Contact with Patient 06/02/15 [phone]     Chief Complaint  Patient presents with  . Sore Throat  . Chills     (Consider location/radiation/quality/duration/timing/severity/associated sxs/prior Treatment) HPI   Patient is a 30 year old female with past medical history of asthma who presents to the ED with complaint of sore throat, onset last night. Patient reports having a worsening sore throat. Endorses associated chills, subjective fever, nasal congestion, shortness of breath, wheezing, decreased appetite. Patient reports having midsternal chest pain that is present with coughing. She also endorses having moderate productive cough with clear sputum. Patient states she has been using over-the-counter sinus pills, cetirizine and cough drops without relief. She notes she used her albuterol inhaler and neb treatment yesterday with mild intermittent relief. Patient reports that a few coworkers were sick with the flu last week. Denies headache, dysphasia, facial/neck swelling, trismus, drooling, muffled voice, hemoptysis, abdominal pain, nausea, vomiting, diarrhea.   Past Medical History  Diagnosis Date  . Asthma     seasonal  . Shortness of breath   . Tobacco abuse   . Seasonal allergies    No past surgical history on file. Family History  Problem Relation Age of Onset  . Cancer Mother     breast  . Asthma Mother   . Asthma Father   . Diabetes type II Paternal Aunt    Social History  Substance Use Topics  . Smoking status: Former Games developer  . Smokeless tobacco: Never Used  . Alcohol Use: 0.0 oz/week    0 Standard drinks or equivalent per week     Comment: daily   OB History    No data available     Review of Systems  Constitutional: Positive for fever (subjective) and chills.  HENT: Positive for congestion and sore throat.   Respiratory: Positive for cough, shortness of breath and wheezing.    Cardiovascular: Positive for chest pain.  All other systems reviewed and are negative.     Allergies  Review of patient's allergies indicates no known allergies.  Home Medications   Prior to Admission medications   Medication Sig Start Date End Date Taking? Authorizing Provider  albuterol (PROVENTIL HFA;VENTOLIN HFA) 108 (90 BASE) MCG/ACT inhaler Inhale 2 puffs into the lungs every 6 (six) hours as needed for wheezing or shortness of breath. 11/29/14  Yes Belkys A Regalado, MD  beclomethasone (QVAR) 40 MCG/ACT inhaler Inhale 2 puffs into the lungs 2 (two) times daily. 11/29/14  Yes Belkys A Regalado, MD  cetirizine (ZYRTEC) 10 MG tablet Take 10 mg by mouth daily.   Yes Historical Provider, MD  ipratropium-albuterol (DUONEB) 0.5-2.5 (3) MG/3ML SOLN Take 3 mLs by nebulization every 4 (four) hours. 11/29/14  Yes Belkys A Regalado, MD  naproxen (NAPROSYN) 250 MG tablet Take 1 tablet (250 mg total) by mouth 2 (two) times daily with a meal. 04/02/15  Yes Everlene Farrier, PA-C  benzonatate (TESSALON) 100 MG capsule Take 2 capsules (200 mg total) by mouth 2 (two) times daily as needed for cough. 06/02/15   Barrett Henle, PA-C  levofloxacin (LEVAQUIN) 750 MG tablet Take 1 tablet (750 mg total) by mouth daily. Patient not taking: Reported on 06/02/2015 11/29/14   Belkys A Regalado, MD  loratadine (CLARITIN) 10 MG tablet Take 1 tablet (10 mg total) by mouth daily. Patient not taking: Reported on 06/02/2015 07/17/14   Osvaldo Shipper, MD  oseltamivir (TAMIFLU) 75  MG capsule Take 1 capsule (75 mg total) by mouth every 12 (twelve) hours. 06/02/15   Barrett HenleNicole Elizabeth Nadeau, PA-C  oxymetazoline (AFRIN NASAL SPRAY) 0.05 % nasal spray Place 1 spray into both nostrils 2 (two) times daily. 06/02/15   Barrett HenleNicole Elizabeth Nadeau, PA-C  penicillin v potassium (VEETID) 500 MG tablet Take 1 tablet (500 mg total) by mouth 4 (four) times daily. Patient not taking: Reported on 06/02/2015 04/02/15   Everlene FarrierWilliam Dansie, PA-C  predniSONE  (DELTASONE) 20 MG tablet Take 2 tablets (40 mg total) by mouth daily. 06/02/15   Satira SarkNicole Elizabeth Nadeau, PA-C   BP 125/82 mmHg  Pulse 110  Temp(Src) 99.2 F (37.3 C) (Oral)  Resp 20  SpO2 95%  LMP 05/19/2015 Physical Exam  Constitutional: She is oriented to person, place, and time. She appears well-developed and well-nourished.  HENT:  Head: Normocephalic and atraumatic.  Right Ear: Tympanic membrane normal.  Left Ear: Tympanic membrane normal.  Nose: Nose normal. Right sinus exhibits no maxillary sinus tenderness and no frontal sinus tenderness. Left sinus exhibits no maxillary sinus tenderness and no frontal sinus tenderness.  Mouth/Throat: Uvula is midline and mucous membranes are normal. Posterior oropharyngeal erythema present. No oropharyngeal exudate, posterior oropharyngeal edema or tonsillar abscesses.  Eyes: Conjunctivae and EOM are normal. Pupils are equal, round, and reactive to light. Right eye exhibits no discharge. Left eye exhibits no discharge. No scleral icterus.  Neck: Normal range of motion. Neck supple.  Cardiovascular: Normal rate, regular rhythm, normal heart sounds and intact distal pulses.   HR 96  Pulmonary/Chest: Effort normal. No respiratory distress. She has wheezes (diffuse end-expiratory wheezes noted bilaterally). She has no rales. She exhibits tenderness (midsternal chest wall TTP).  Abdominal: Soft. Bowel sounds are normal. She exhibits no distension and no mass. There is no tenderness. There is no rebound and no guarding.  Musculoskeletal: Normal range of motion. She exhibits no edema.  Lymphadenopathy:    She has no cervical adenopathy.  Neurological: She is alert and oriented to person, place, and time.  Skin: Skin is warm and dry.  Nursing note and vitals reviewed.   ED Course  Procedures (including critical care time) Labs Review Labs Reviewed  RAPID STREP SCREEN (NOT AT [facility])  CULTURE, GROUP A STREP [facility])    Imaging Review Dg Chest 2  View  06/02/2015  CLINICAL DATA:  Chills since last night EXAM: CHEST  2 VIEW COMPARISON:  11/29/2014 FINDINGS: Normal heart size. Lungs clear. No pneumothorax. No pleural effusion. IMPRESSION: No active cardiopulmonary disease. Electronically Signed   By: Jolaine ClickArthur  Hoss M.D.   On: 06/02/2015 10:44   I have personally reviewed and evaluated these images and lab results as part of my medical decision-making.   EKG Interpretation None      MDM   Final diagnoses:  Asthma, mild intermittent, with acute exacerbation  Sore throat  Cough    Patient presents with subjective fever, nasal congestion, cough, wheezing, shortness of breath. History of asthma. Vitals are stable, afebrile. Exam revealed posterior oropharyngeal erythema, diffuse and expiratory wheezes bilaterally. Patient given Tylenol and DuoNeb.    On reexamination patient continued to have diffuse end-expiratory wheezes noted bilaterally. Will give patient a continuous neb treatment and 60 mg prednisone. On reevaluation patient's wheezing had significantly improved. She reports she is feeling better and her breathing and cough have improved. No signs of dehydration, tolerating PO's.  Symptoms consistent with influenza versus viral URI with asthma exacerbation. Patient reports she has an albuterol inhaler at  home. Due to patient's symptoms starting within 24 hours, will plan to treat patient with Tamiflu. Discussed results and plan for discharge with patient. Patient will be discharged with symptomatic treatment. Patient given resource guide to follow up with PCP.  Evaluation does not show pathology requring ongoing emergent intervention or admission. Pt is hemodynamically stable and mentating appropriately. Discussed findings/results and plan with patient/guardian, who agrees with plan. All questions answered. Return precautions discussed and outpatient follow up given.        Satira Sark [place], [name] 06/02/15 1549  Marily Memos,  MD 06/03/15 1725

## 2015-06-02 NOTE — ED Notes (Signed)
Pt states that she has been having chills and sweating since last night.  "I feel like I'm running hot".  Pt states that she also has a sore throat/cough.  Pt denies vomiting or diarrhea.

## 2015-06-02 NOTE — Discharge Instructions (Signed)
Take your medications as prescribed. I recommend continuing to use your albuterol inhaler at home as needed for shortness of breath/wheezing. You may take Tylenol as prescribed over-the-counter as needed for fever and body aches. Continue drinking fluids at home to remain hydrated. Please follow up with a primary care provider from the Resource Guide provided below in 4-5 days. Please return to the Emergency Department if symptoms worsen or new onset of uncontrollable fever, coughing up blood, lightheadedness, dizziness, chest pain, vomiting, unable to tolerate fluids, syncope.

## 2015-06-04 LAB — CULTURE, GROUP A STREP (THRC)

## 2015-06-08 ENCOUNTER — Ambulatory Visit: Payer: Self-pay | Attending: Internal Medicine | Admitting: Internal Medicine

## 2015-06-08 ENCOUNTER — Encounter: Payer: Self-pay | Admitting: Internal Medicine

## 2015-06-08 VITALS — BP 136/94 | HR 97 | Temp 98.9°F | Resp 16 | Ht 62.0 in | Wt 172.0 lb

## 2015-06-08 DIAGNOSIS — J069 Acute upper respiratory infection, unspecified: Secondary | ICD-10-CM | POA: Insufficient documentation

## 2015-06-08 DIAGNOSIS — Z131 Encounter for screening for diabetes mellitus: Secondary | ICD-10-CM | POA: Insufficient documentation

## 2015-06-08 DIAGNOSIS — Z720 Tobacco use: Secondary | ICD-10-CM

## 2015-06-08 DIAGNOSIS — Z79899 Other long term (current) drug therapy: Secondary | ICD-10-CM | POA: Insufficient documentation

## 2015-06-08 DIAGNOSIS — J4520 Mild intermittent asthma, uncomplicated: Secondary | ICD-10-CM | POA: Insufficient documentation

## 2015-06-08 DIAGNOSIS — Z87891 Personal history of nicotine dependence: Secondary | ICD-10-CM | POA: Insufficient documentation

## 2015-06-08 LAB — POCT GLYCOSYLATED HEMOGLOBIN (HGB A1C): Hemoglobin A1C: 4

## 2015-06-08 MED ORDER — BECLOMETHASONE DIPROPIONATE 40 MCG/ACT IN AERS: 2.0000 | INHALATION_SPRAY | Freq: Two times a day (BID) | RESPIRATORY_TRACT | Status: DC

## 2015-06-08 MED ORDER — MONTELUKAST SODIUM 10 MG PO TABS: 10.0000 mg | ORAL_TABLET | Freq: Every day | ORAL | Status: DC

## 2015-06-08 MED ORDER — GUAIFENESIN ER 600 MG PO TB12: 600.0000 mg | ORAL_TABLET | Freq: Two times a day (BID) | ORAL | Status: DC | PRN

## 2015-06-08 MED FILL — ?MONTELUKAST SOD 10 MG TAB: 10 | 30 days supply | Qty: 30 | Fill #0

## 2015-06-08 MED FILL — !QVAR 40 MCG ORAL INHALER: 40 MCG | 30 days supply | Qty: 1 | Fill #0

## 2015-06-08 NOTE — Progress Notes (Signed)
Dawn Robertson, is a 30 y.o. female  XBJ:[id number]  HYQ:[id number]  DOB - 10/18/1985  CC:  Chief Complaint  Patient presents with  . Hospitalization Follow-up  . URI       HPI: Dawn Robertson is a 30 y.o. female here today to establish medical care.  Sp recent ed visit 06/02/15 for uri, given tamiflu.  Feels better overall. Said that had productive brown sputum that Tuesday 2/7, lost her voice for a bit there after, but now better.  Breathing better overall, still bringing up clear sputum, which wakes her up at night sometimes.  Has used mucinex tablets in past without problems, had reaction? At one time when took liquid form, but no issues w/ tablet form. Using her duonebs nebulizer 2x /day, has plenty of that at home.  Was using her albuterol mdi inhaler a lot recently, but only used it once yesterday and none yet today.  Interested in continuing care in our clinic.   Patient has No headache, No chest pain, No abdominal pain - No Nausea, No new weakness tingling or numbness, No SOB.  No Known Allergies Past Medical History  Diagnosis Date  . Asthma     seasonal  . Shortness of breath   . Tobacco abuse   . Seasonal allergies    Currently has stopped smoking.  Current Outpatient Prescriptions on File Prior to Visit  Medication Sig Dispense Refill  . albuterol (PROVENTIL HFA;VENTOLIN HFA) 108 (90 BASE) MCG/ACT inhaler Inhale 2 puffs into the lungs every 6 (six) hours as needed for wheezing or shortness of breath. 1 Inhaler 2  . beclomethasone (QVAR) 40 MCG/ACT inhaler Inhale 2 puffs into the lungs 2 (two) times daily. 1 Inhaler 2  . cetirizine (ZYRTEC) 10 MG tablet Take 10 mg by mouth daily.    Marland Kitchen ipratropium-albuterol (DUONEB) 0.5-2.5 (3) MG/3ML SOLN Take 3 mLs by nebulization every 4 (four) hours. 360 mL 2  . naproxen (NAPROSYN) 250 MG tablet Take 1 tablet (250 mg total) by mouth 2 (two) times daily with a meal. 30 tablet 0  . oxymetazoline (AFRIN NASAL SPRAY) 0.05 % nasal  spray Place 1 spray into both nostrils 2 (two) times daily. 30 mL 0  . [DISCONTINUED] diphenhydrAMINE (BENADRYL) 25 MG tablet Take 25 mg by mouth every 6 (six) hours as needed. For allergies     No current facility-administered medications on file prior to visit.   Family History  Problem Relation Age of Onset  . Cancer Mother     breast  . Asthma Mother   . Asthma Father   . Diabetes type II Paternal Aunt    Social History   Social History  . Marital Status: Single    Spouse Name: N/A  . Number of Children: N/A  . Years of Education: N/A   Occupational History  . Not on file.   Social History Main Topics  . Smoking status: Former Games developer  . Smokeless tobacco: Never Used  . Alcohol Use: 0.0 oz/week    0 Standard drinks or equivalent per week     Comment: daily  . Drug Use: No  . Sexual Activity: No   Other Topics Concern  . Not on file   Social History Narrative    Review of Systems: Constitutional: Negative for fever, chills, diaphoresis, activity change, appetite change and fatigue. HENT: Negative for ear pain, nosebleeds, facial swelling, rhinorrhea, neck pain, neck stiffness and ear discharge.  + improving congestion. Still has some clear sputum production. Eyes:  Negative for pain, discharge, redness, itching and visual disturbance. Respiratory: Negative for choking, chest tightness, shortness of breath, wheezing and stridor.   + cough, but much improved.  Not using albuterol rescue as much since last week, has not used today.  Unable to get QVAR mdi prescribed last time due to cost. Cardiovascular: Negative for chest pain, palpitations and leg swelling. Gastrointestinal: Negative for abdominal distention. Genitourinary: Negative for dysuria, urgency, frequency, hematuria, flank pain, decreased urine volume, difficulty urinating and dyspareunia.  Musculoskeletal: Negative for back pain, joint swelling, arthralgia and gait problem. Neurological: Negative for  dizziness, tremors, seizures, syncope, facial asymmetry, speech difficulty, weakness, light-headedness, numbness and headaches.  Hematological: Negative for adenopathy. Does not bruise/bleed easily. Psychiatric/Behavioral: Negative for hallucinations, behavioral problems, confusion, dysphoric mood, decreased concentration and agitation.    Objective:   Filed Vitals:   06/08/15 1112  BP: 136/94  Pulse: 97  Temp: 98.9 F (37.2 C)  Resp: 16    Physical Exam: Constitutional: Patient appears well-developed and well-nourished. No distress.  Aaox3. Pleasant.  HENT: Normocephalic, atraumatic, External right and left ear normal. Oropharynx is clear and moist.  Eyes: Conjunctivae and EOM are normal. PERRL, no scleral icterus. Neck: Normal ROM. Neck supple. No JVD. No tracheal deviation. No thyromegaly. CVS: RRR, S1/S2 +, no murmurs, no gallops, no carotid bruit.  Pulmonary: Effort and breath sounds normal, no stridor, rhonchi.  Occasional exp wheeze at bilat bases, but good airflow overall.  No c/rales. Abdominal: Soft. BS +, no distension, tenderness, rebound or guarding.  Musculoskeletal: Normal range of motion. No edema and no tenderness.  Lymphadenopathy: No lymphadenopathy noted, cervical, inguinal or axillary Neuro: Alert. Normal reflexes, muscle tone coordination. No cranial nerve deficit. Skin: Skin is warm and dry. No rash noted. Not diaphoretic. No erythema. No pallor. Psychiatric: Normal mood and affect. Behavior, judgment, thought content normal.  Lab Results  Component Value Date   WBC 15.5* 11/28/2014   HGB 13.1 11/28/2014   HCT 38.1 11/28/2014   MCV 96.0 11/28/2014   PLT 232 11/28/2014   Lab Results  Component Value Date   CREATININE 0.66 11/28/2014   BUN 8 11/28/2014   NA 136 11/28/2014   K 4.2 11/28/2014   CL 109 11/28/2014   CO2 22 11/28/2014    Lab Results  Component Value Date   HGBA1C 4 5 06/08/2015   Lipid Panel  No results found for: CHOL, TRIG, HDL,  CHOLHDL, VLDL, LDLCALC     Assessment and plan:   1. Tobacco abuse - former smoker, encouraged to abstain.  2. Asthma, mild intermittent, uncomplicated - using duonebs  Bid, albuterol mdi abt 10 x total since last week ED visit, hasn't used any today. - start qvar/beclomethasone mdi 2xday, start singulair 10 daily. - continue albuterol mdi prn /rescue.  3. healthmaintenance - dm screening, POCT a1c 4.5  Return in about 2 months (around 08/08/2015).  For annual, tsh/cbc/bmp/lipids/papsmear   The patient was given clear instructions to go to ER or return to medical center if symptoms don't improve, worsen or new problems develop. The patient verbalized understanding. The patient was told to call to get lab results if they haven't heard anything in the next week.     Pete Glatterawn T Kendryck Lacroix, MD, MBA/MHA [facility] [place], [place] [phone]   06/08/2015, 11:36 AM

## 2015-06-08 NOTE — Progress Notes (Signed)
Patient's here for URI. Patient states she's been having coughing spells with some red blood tan color mucus d/c with loss of voice.   Patient states she has had any d/c since Tuesday.  Patient declines pain today.  Patient was tested for strep and return negative.  Patient agreed to diabetes screening.

## 2015-06-08 NOTE — Patient Instructions (Signed)
- financial aid services - pick up rx  Fu with me in 2-3 months for annual/papsmear, labs, etc.  If am appt, come in fasting so can check cholesterol  It was a pleasure meeting you!  Asthma, Adult Asthma is a recurring condition in which the airways tighten and narrow. Asthma can make it difficult to breathe. It can cause coughing, wheezing, and shortness of breath. Asthma episodes, also called asthma attacks, range from minor to life-threatening. Asthma cannot be cured, but medicines and lifestyle changes can help control it. CAUSES Asthma is believed to be caused by inherited (genetic) and environmental factors, but its exact cause is unknown. Asthma may be triggered by allergens, lung infections, or irritants in the air. Asthma triggers are different for each person. Common triggers include:   Animal dander.  Dust mites.  Cockroaches.  Pollen from trees or grass.  Mold.  Smoke.  Air pollutants such as dust, household cleaners, hair sprays, aerosol sprays, paint fumes, strong chemicals, or strong odors.  Cold air, weather changes, and winds (which increase molds and pollens in the air).  Strong emotional expressions such as crying or laughing hard.  Stress.  Certain medicines (such as aspirin) or types of drugs (such as beta-blockers).  Sulfites in foods and drinks. Foods and drinks that may contain sulfites include dried fruit, potato chips, and sparkling grape juice.  Infections or inflammatory conditions such as the flu, a cold, or an inflammation of the nasal membranes (rhinitis).  Gastroesophageal reflux disease (GERD).  Exercise or strenuous activity. SYMPTOMS Symptoms may occur immediately after asthma is triggered or many hours later. Symptoms include:  Wheezing.  Excessive nighttime or early morning coughing.  Frequent or severe coughing with a common cold.  Chest tightness.  Shortness of breath. DIAGNOSIS  The diagnosis of asthma is made by a review  of your medical history and a physical exam. Tests may also be performed. These may include:  Lung function studies. These tests show how much air you breathe in and out.  Allergy tests.  Imaging tests such as X-rays. TREATMENT  Asthma cannot be cured, but it can usually be controlled. Treatment involves identifying and avoiding your asthma triggers. It also involves medicines. There are 2 classes of medicine used for asthma treatment:   Controller medicines. These prevent asthma symptoms from occurring. They are usually taken every day.  Reliever or rescue medicines. These quickly relieve asthma symptoms. They are used as needed and provide short-term relief. Your health care provider will help you create an asthma action plan. An asthma action plan is a written plan for managing and treating your asthma attacks. It includes a list of your asthma triggers and how they may be avoided. It also includes information on when medicines should be taken and when their dosage should be changed. An action plan may also involve the use of a device called a peak flow meter. A peak flow meter measures how well the lungs are working. It helps you monitor your condition. HOME CARE INSTRUCTIONS   Take medicines only as directed by your health care provider. Speak with your health care provider if you have questions about how or when to take the medicines.  Use a peak flow meter as directed by your health care provider. Record and keep track of readings.  Understand and use the action plan to help minimize or stop an asthma attack without needing to seek medical care.  Control your home environment in the following ways to help prevent  asthma attacks:  Do not smoke. Avoid being exposed to secondhand smoke.  Change your heating and air conditioning filter regularly.  Limit your use of fireplaces and wood stoves.  Get rid of pests (such as roaches and mice) and their droppings.  Throw away plants if  you see mold on them.  Clean your floors and dust regularly. Use unscented cleaning products.  Try to have someone else vacuum for you regularly. Stay out of rooms while they are being vacuumed and for a short while afterward. If you vacuum, use a dust mask from a hardware store, a double-layered or microfilter vacuum cleaner bag, or a vacuum cleaner with a HEPA filter.  Replace carpet with wood, tile, or vinyl flooring. Carpet can trap dander and dust.  Use allergy-proof pillows, mattress covers, and box spring covers.  Wash bed sheets and blankets every week in hot water and dry them in a dryer.  Use blankets that are made of polyester or cotton.  Clean bathrooms and kitchens with bleach. If possible, have someone repaint the walls in these rooms with mold-resistant paint. Keep out of the rooms that are being cleaned and painted.  Wash hands frequently. SEEK MEDICAL CARE IF:   You have wheezing, shortness of breath, or a cough even if taking medicine to prevent attacks.  The colored mucus you cough up (sputum) is thicker than usual.  Your sputum changes from clear or white to yellow, green, gray, or bloody.  You have any problems that may be related to the medicines you are taking (such as a rash, itching, swelling, or trouble breathing).  You are using a reliever medicine more than 2-3 times per week.  Your peak flow is still at 50-79% of your personal best after following your action plan for 1 hour.  You have a fever. SEEK IMMEDIATE MEDICAL CARE IF:   You seem to be getting worse and are unresponsive to treatment during an asthma attack.  You are short of breath even at rest.  You get short of breath when doing very little physical activity.  You have difficulty eating, drinking, or talking due to asthma symptoms.  You develop chest pain.  You develop a fast heartbeat.  You have a bluish color to your lips or fingernails.  You are light-headed, dizzy, or  faint.  Your peak flow is less than 50% of your personal best.   This information is not intended to replace advice given to you by your health care provider. Make sure you discuss any questions you have with your health care provider.   Document Released: 03/14/2005 Document Revised: 12/03/2014 Document Reviewed: 10/11/2012 Elsevier Interactive Patient Education [employer]

## 2015-07-16 MED FILL — !QVAR 40 MCG ORAL INHALER: 40 MCG | 30 days supply | Qty: 1 | Fill #1

## 2015-08-04 ENCOUNTER — Ambulatory Visit: Payer: Self-pay | Attending: Internal Medicine | Admitting: Internal Medicine

## 2015-08-04 ENCOUNTER — Encounter: Payer: Self-pay | Admitting: Internal Medicine

## 2015-08-04 ENCOUNTER — Other Ambulatory Visit: Payer: Self-pay | Admitting: Internal Medicine

## 2015-08-04 ENCOUNTER — Telehealth: Payer: Self-pay | Admitting: Internal Medicine

## 2015-08-04 VITALS — BP 122/81 | HR 84 | Temp 98.9°F | Wt 168.2 lb

## 2015-08-04 DIAGNOSIS — Z803 Family history of malignant neoplasm of breast: Secondary | ICD-10-CM

## 2015-08-04 DIAGNOSIS — J4541 Moderate persistent asthma with (acute) exacerbation: Secondary | ICD-10-CM

## 2015-08-04 DIAGNOSIS — N760 Acute vaginitis: Secondary | ICD-10-CM

## 2015-08-04 MED ORDER — FLUCONAZOLE 150 MG PO TABS: 150.0000 mg | ORAL_TABLET | Freq: Every day | ORAL | Status: DC

## 2015-08-04 MED ORDER — BECLOMETHASONE DIPROPIONATE 80 MCG/ACT IN AERS: 2.0000 | INHALATION_SPRAY | Freq: Two times a day (BID) | RESPIRATORY_TRACT | Status: DC

## 2015-08-04 MED ORDER — ALBUTEROL SULFATE HFA 108 (90 BASE) MCG/ACT IN AERS: 2.0000 | INHALATION_SPRAY | Freq: Four times a day (QID) | RESPIRATORY_TRACT | Status: DC | PRN

## 2015-08-04 MED ORDER — PREDNISONE 20 MG PO TABS: 40.0000 mg | ORAL_TABLET | Freq: Every day | ORAL | Status: DC

## 2015-08-04 MED FILL — predniSONE 20 MG TABS: 20 | 7 days supply | Qty: 10 | Fill #0

## 2015-08-04 MED FILL — QVAR 80 MCG ORAL INHALER: 80 | 30 days supply | Qty: 9 | Fill #0

## 2015-08-04 MED FILL — !PROVENTIL HFA 90 MCG INH: 108 (90 BAS | 30 days supply | Qty: 1 | Fill #0

## 2015-08-04 MED FILL — FLUCONAZOLE 150 MG TABLET: 150 | 3 days supply | Qty: 3 | Fill #0

## 2015-08-04 NOTE — Addendum Note (Signed)
Addended byDierdre Searles: Ellajane Stong T on: 08/04/2015 02:27 PM   Modules accepted: Kipp BroodSmartSet

## 2015-08-04 NOTE — Telephone Encounter (Signed)
Left HIPPA compliant message.  Asked her to make appt w/ me again in few wks to discuss her risk factors/hx.

## 2015-08-04 NOTE — Progress Notes (Addendum)
Darchelle Nunes, is a 30 y.o. female  AVW:[id number]  WGN:[id number]  DOB - 05-12-1985  Chief Complaint  Patient presents with  . Gynecologic Exam        Subjective:   Lastacia Solum is a 30 y.o. female here today for a follow up visit asthma and pap smear. Currently not sexually active, denies vaginal discharge/pruritis.  Has been using her Qvar and singulair which seems to have helped her breathing a lot. She is using her Albuterol inhaler quite less, but still ran out of it recently due to frequent use.  Denies f/c/sob currently/coug.  Per pt, never had pap smear prior.   Patient has No headache, No chest pain, No abdominal pain - No Nausea, No new weakness tingling or numbness, No Cough - SOB.  Problem  Vaginitis and Vulvovaginitis    ALLERGIES: No Known Allergies  PAST MEDICAL HISTORY: Past Medical History  Diagnosis Date  . Asthma     seasonal  . Shortness of breath   . Tobacco abuse   . Seasonal allergies     MEDICATIONS AT HOME: Prior to Admission medications   Medication Sig Start Date End Date Taking? Authorizing Provider  albuterol (PROVENTIL HFA;VENTOLIN HFA) 108 (90 Base) MCG/ACT inhaler Inhale 2 puffs into the lungs every 6 (six) hours as needed for wheezing or shortness of breath. 08/04/15  Yes Pete Glatter, MD  ipratropium-albuterol (DUONEB) 0.5-2.5 (3) MG/3ML SOLN Take 3 mLs by nebulization every 4 (four) hours. 11/29/14  Yes Belkys A Regalado, MD  montelukast (SINGULAIR) 10 MG tablet Take 1 tablet (10 mg total) by mouth at bedtime. 06/08/15  Yes Pete Glatter, MD  beclomethasone (QVAR) 80 MCG/ACT inhaler Inhale 2 puffs into the lungs 2 (two) times daily. 08/04/15   Pete Glatter, MD  cetirizine (ZYRTEC) 10 MG tablet Take 10 mg by mouth daily. Reported on 08/04/2015    Historical Provider, MD  fluconazole (DIFLUCAN) 150 MG tablet Take 1 tablet (150 mg total) by mouth daily. 08/04/15   Pete Glatter, MD  guaiFENesin (MUCINEX) 600 MG 12 hr tablet  Take 1 tablet (600 mg total) by mouth 2 (two) times daily as needed for to loosen phlegm. Patient not taking: Reported on 08/04/2015 06/08/15   Pete Glatter, MD  naproxen (NAPROSYN) 250 MG tablet Take 1 tablet (250 mg total) by mouth 2 (two) times daily with a meal. Patient not taking: Reported on 08/04/2015 04/02/15   Everlene Farrier, PA-C  oxymetazoline (AFRIN NASAL SPRAY) 0.05 % nasal spray Place 1 spray into both nostrils 2 (two) times daily. Patient not taking: Reported on 08/04/2015 06/02/15   Barrett Henle, PA-C  predniSONE (DELTASONE) 20 MG tablet Take 2 tablets (40 mg total) by mouth daily with breakfast. Day 1-3: take  po qam (= 2 tabs), than take  daily for 4 days. 08/04/15   Pete Glatter, MD     Objective:   Filed Vitals:   08/04/15 1136  BP: 122/81  Pulse: 84  Temp: 98.9 F (37.2 C)  TempSrc: Oral  Weight: 168 lb 3.2 oz (76.295 kg)   [place]/CMA present during pelvic /breast exam.  Exam General appearance : Awake, alert, not in any distress. Speech Clear. Not toxic looking HEENT: Atraumatic and Normocephalic, pupils equally reactive to light. Neck: supple, no JVD. No cervical lymphadenopathy.  Chest: good air movement, but diffuse exp wheezing noted throughout lung fields, no w/c. CVS: S1 S2 regular, no murmurs/gallups or rubs. Abdomen: Bowel sounds active, Non tender  and not distended with no gaurding, rigidity or rebound. Extremities: B/L Lower Ext shows no edema, both legs are warm to touch Pelvic Exam: Cervix friable, external genitalia normal, no adnexal masses or tenderness, no cervical motion tenderness, rectovaginal septum normal, uterus normal size, shape, and consistency and vagina small (used smallest speculum had, but still caused some discomfort unfortunately), white/yellow thick mucopurulent discharge noted around cervical os as well.  Neurology: Awake alert, and oriented X 3, CN II-XII grossly intact, Non focal Skin:No Rash  Data  Review Lab Results  Component Value Date   HGBA1C 4 5 06/08/2015    [facility] 08/04/2015 06/08/2015 07/22/2014  Decreased Interest 1 0 0  Down, Depressed, Hopeless 1 0 0  PHQ - 2 Score 2 0 0  Altered sleeping 1 - -  Tired, decreased energy 1 - -  Change in appetite 2 - -  Feeling bad or failure about yourself  2 - -  Trouble concentrating 0 - -  Moving slowly or fidgety/restless 0 - -  Suicidal thoughts 0 - -  PHQ-9 Score 8 - -  Difficult doing work/chores Somewhat difficult - -      Assessment & Plan   1. Asthma with exacerbation, moderate persistent - increase QVAR to 80mcg inhaler dosing, short course prednisone taper ordered, continue singulair, zyrtec and albuterol prn.  2. Vaginitis and vulvovaginitis, w/ possible candidiasis - papsmear today, will f/u w/ [facility] Mosie Epstein/wetprep /path, no cerv motion tenderness to suggest PID - Cytology - PAP - diflucan x 3 days ordered for now.  3. Breast cancer hx (mom) - per pt, mom dx w/ breast cancer in mid 30s she thinks, not certain about much more.  I will f/u w/ Patient to see if she can find out more about mom's breast cancer hx/genotype, etc.    Patient have been counseled extensively about nutrition and exercise  Return in about 3 months (around 11/04/2015), or if symptoms worsen or fail to improve asthma, for asthma.  The patient was given clear instructions to go to ER or return to medical center if symptoms don't improve, worsen or new problems develop. The patient verbalized understanding. The patient was told to call to get lab results if they haven't heard anything in the next week.    Pete Glatterawn T Remmington Teters, MD, MBA/MHA [facility] [place], [place] [phone]   08/04/2015, 12:30 PM    Addendum at 210pm:  I was able to get hold of mom via phone for more information.  Pt's mom had breast cancer, dx at 31y/o, s/p bx/surgery/radiation, but not mastectomy per mom. She does not recall  genetic testing at the time.  At 225PM; left vm w/ patient to call me back or set up appt w/ me in few wks and we can go over her risk factors, etc.

## 2015-08-04 NOTE — Patient Instructions (Signed)
Asthma, Adult Asthma is a condition of the lungs in which the airways tighten and narrow. Asthma can make it hard to breathe. Asthma cannot be cured, but medicine and lifestyle changes can help control it. Asthma may be started (triggered) by:  Animal skin flakes (dander).  Dust.  Cockroaches.  Pollen.  Mold.  Smoke.  Cleaning products.  Hair sprays or aerosol sprays.  Paint fumes or strong smells.  Cold air, weather changes, and winds.  Crying or laughing hard.  Stress.  Certain medicines or drugs.  Foods, such as dried fruit, potato chips, and sparkling grape juice.  Infections or conditions (colds, flu).  Exercise.  Certain medical conditions or diseases.  Exercise or tiring activities. HOME CARE   Take medicine as told by your doctor.  Use a peak flow meter as told by your doctor. A peak flow meter is a tool that measures how well the lungs are working.  Record and keep track of the peak flow meter's readings.  Understand and use the asthma action plan. An asthma action plan is a written plan for taking care of your asthma and treating your attacks.  To help prevent asthma attacks:  Do not smoke. Stay away from secondhand smoke.  Change your heating and air conditioning filter often.  Limit your use of fireplaces and wood stoves.  Get rid of pests (such as roaches and mice) and their droppings.  Throw away plants if you see mold on them.  Clean your floors. Dust regularly. Use cleaning products that do not smell.  Have someone vacuum when you are not home. Use a vacuum cleaner with a HEPA filter if possible.  Replace carpet with wood, tile, or vinyl flooring. Carpet can trap animal skin flakes and dust.  Use allergy-proof pillows, mattress covers, and box spring covers.  Wash bed sheets and blankets every week in hot water and dry them in a dryer.  Use blankets that are made of polyester or cotton.  Clean bathrooms and kitchens with bleach.  If possible, have someone repaint the walls in these rooms with mold-resistant paint. Keep out of the rooms that are being cleaned and painted.  Wash hands often. GET HELP IF:  You have make a whistling sound when breaking (wheeze), have shortness of breath, or have a cough even if taking medicine to prevent attacks.  The colored mucus you cough up (sputum) is thicker than usual.  The colored mucus you cough up changes from clear or white to yellow, green, gray, or bloody.  You have problems from the medicine you are taking such as:  A rash.  Itching.  Swelling.  Trouble breathing.  You need reliever medicines more than 2-3 times a week.  Your peak flow measurement is still at 50-79% of your personal best after following the action plan for 1 hour.  You have a fever. GET HELP RIGHT AWAY IF:   You seem to be worse and are not responding to medicine during an asthma attack.  You are short of breath even at rest.  You get short of breath when doing very little activity.  You have trouble eating, drinking, or talking.  You have chest pain.  You have a fast heartbeat.  Your lips or fingernails start to turn blue.  You are light-headed, dizzy, or faint.  Your peak flow is less than 50% of your personal best.   This information is not intended to replace advice given to you by your health care provider. Make sure   you discuss any questions you have with your health care provider.   Document Released: 08/31/2007 Document Revised: 12/03/2014 Document Reviewed: 10/11/2012 Elsevier Interactive Patient Education 2016 Elsevier Inc.  

## 2015-08-04 NOTE — Addendum Note (Signed)
Addended byDierdre Searles: Sarajane Fambrough T on: 08/04/2015 12:48 PM   Modules accepted: Kipp BroodSmartSet

## 2015-08-04 NOTE — Telephone Encounter (Signed)
D/w mom. Mother did have breast cancer dx at 31yo, which was about 15 years ago.  She does not recall any genetic testing but did have bx/surgery/radiation but no mastectomy.

## 2015-08-05 LAB — CERVICOVAGINAL ANCILLARY ONLY
Chlamydia: NEGATIVE
NEISSERIA GONORRHEA: NEGATIVE
WET PREP (BD AFFIRM): NEGATIVE

## 2015-08-05 LAB — CYTOLOGY - PAP

## 2015-08-07 LAB — CERVICOVAGINAL ANCILLARY ONLY: Herpes: NEGATIVE

## 2015-08-10 ENCOUNTER — Ambulatory Visit: Payer: Self-pay | Attending: Internal Medicine

## 2015-08-10 MED FILL — MONTELUKAST SOD 10 MG TAB: 10 | 30 days supply | Qty: 30 | Fill #1

## 2015-08-12 ENCOUNTER — Encounter: Payer: Self-pay | Admitting: Genetic Counselor

## 2015-08-12 ENCOUNTER — Telehealth: Payer: Self-pay | Admitting: Internal Medicine

## 2015-08-12 ENCOUNTER — Telehealth: Payer: Self-pay | Admitting: Genetic Counselor

## 2015-08-12 ENCOUNTER — Encounter: Payer: Self-pay | Admitting: Internal Medicine

## 2015-08-12 NOTE — Telephone Encounter (Signed)
Verified address, in basket referring appt date and time, mailed new pt packet

## 2015-08-12 NOTE — Telephone Encounter (Signed)
Patient returned call to nurse...please follow up °

## 2015-08-12 NOTE — Telephone Encounter (Signed)
Ret pt cll - advsd of lab results.

## 2015-09-01 MED FILL — VENTOLIN HFA 90 MCG INHALER: 108 (90 BAS | 30 days supply | Qty: 18 | Fill #0

## 2015-09-16 ENCOUNTER — Other Ambulatory Visit: Payer: Self-pay | Admitting: *Deleted

## 2015-09-16 DIAGNOSIS — J45901 Unspecified asthma with (acute) exacerbation: Secondary | ICD-10-CM

## 2015-09-16 MED ORDER — BECLOMETHASONE DIPROPIONATE 80 MCG/ACT IN AERS: 2.0000 | INHALATION_SPRAY | Freq: Two times a day (BID) | RESPIRATORY_TRACT | Status: DC

## 2015-09-16 MED ORDER — ALBUTEROL SULFATE HFA 108 (90 BASE) MCG/ACT IN AERS: 2.0000 | INHALATION_SPRAY | Freq: Four times a day (QID) | RESPIRATORY_TRACT | Status: DC | PRN

## 2015-09-16 NOTE — Telephone Encounter (Signed)
PASS PROGRAM 

## 2015-09-17 ENCOUNTER — Other Ambulatory Visit: Payer: Self-pay

## 2015-09-17 ENCOUNTER — Ambulatory Visit (HOSPITAL_BASED_OUTPATIENT_CLINIC_OR_DEPARTMENT_OTHER): Payer: Self-pay | Admitting: Genetic Counselor

## 2015-09-17 DIAGNOSIS — Z803 Family history of malignant neoplasm of breast: Secondary | ICD-10-CM

## 2015-09-17 DIAGNOSIS — Z8042 Family history of malignant neoplasm of prostate: Secondary | ICD-10-CM

## 2015-09-18 ENCOUNTER — Encounter: Payer: Self-pay | Admitting: Genetic Counselor

## 2015-09-18 DIAGNOSIS — Z803 Family history of malignant neoplasm of breast: Secondary | ICD-10-CM | POA: Insufficient documentation

## 2015-09-18 NOTE — Progress Notes (Signed)
REFERRING PROVIDER: Maren Reamer, MD [place], [place] [id number]  PRIMARY PROVIDER:  Maren Reamer, MD  PRIMARY REASON FOR VISIT:  1. Family history of breast cancer in mother   2. Family history of prostate cancer      HISTORY OF PRESENT ILLNESS:   Dawn Robertson, a 30 y.o. female, was seen for a [facility] [place] cancer genetics consultation at the request of Dr. Janne Napoleon due to a family history of early-onset breast cancer.  Dawn Robertson presents to clinic today to discuss the possibility of a hereditary predisposition to cancer, genetic testing, and to further clarify her future cancer risks, as well as potential cancer risks for family members.    Dawn Robertson is a 30 y.o. female with no personal history of cancer.     HORMONAL RISK FACTORS:  Menarche was at age 84-12.  First live birth at age - no children.  OCP use for approximately 0 years.  Ovaries intact: yes.  Hysterectomy: no.  Menopausal status: premenopausal.  HRT use: 0 years. Colonoscopy: n/a; not examined. Mammogram within the last year: no, but Dr. Janne Napoleon has mentioned setting her up for one. Number of breast biopsies: 0. Up to date with pelvic exams:  yes, just had one on 08/04/15 (her first one) and this was normal. Any excessive radiation exposure/other exposures in the past:  no, but does report some additional history of some secondhand smoke exposure (mom smokes)  Past Medical History  Diagnosis Date  . Asthma     seasonal  . Shortness of breath   . Tobacco abuse   . Seasonal allergies     History reviewed. No pertinent past surgical history.  Social History   Social History  . Marital Status: Single    Spouse Name: N/A  . Number of Children: N/A  . Years of Education: N/A   Social History Main Topics  . Smoking status: Former Smoker    Types: Cigarettes    Quit date: 03/28/2013  . Smokeless tobacco: Never Used     Comment: hx of 1 pk every 3 days  . Alcohol Use:  0.0 oz/week    0 Standard drinks or equivalent per week     Comment: daily glass of wine  . Drug Use: No  . Sexual Activity: No   Other Topics Concern  . None   Social History Narrative     FAMILY HISTORY:  We obtained a detailed, 4-generation family history.  Significant diagnoses are listed below: Family History  Problem Relation Age of Onset  . Asthma Mother   . Breast cancer Mother 47    s/p surgery and radiation  . Other Mother     hx of hysterectomy for bleeding/blood clotting in her 37s  . Asthma Father   . Diabetes type II Paternal Aunt   . Prostate cancer Other     maternal great uncle (MGM's brother) d. later age    Dawn Robertson is an only child.  Her mother is currently 81 and she was diagnosed with breast cancer at 18.  This was treated with surgery, radiation, and potentially with chemotherapy as well.  Dawn Robertson mother also has a history of a hysterectomy due to abnormal bleeding/blood clotting in her 35s.  Dawn Robertson father is currently 25.  She reports no known cancer history for him.    Dawn Robertson mother has one full brother and two maternal half-brothers.  Her full brother is currently 77 and has not had  cancer.  One half-brother died of an unspecified illness at 33.  The other half-brother is approximately 2 and has not had cancer.  Dawn Robertson reports no known history of cancer for any of her maternal first cousins.  Her maternal grandparents are both 80 years of age and have not had cancer.  Dawn Robertson maternal grandmother has two full sisters and three full brothers--one of the brothers died of prostate cancer at a "later age".    Dawn Robertson father has two full sisters who are both in their 42s and who have not had cancer.  Dawn Robertson has limited information for her paternal first cousins.  She also has limited information for her paternal grandparents whom she reports passed away in their 60s-80s.    She is unaware of  any previous family history of genetic testing for hereditary cancer.  Her mother has not had genetic testing, though she reports that it was offered to her in the past.  Patient's maternal ancestors are of [place] American and Native American/Cherokee descent, and paternal ancestors are of African American descent. There is no reported Ashkenazi Jewish ancestry. There is no known consanguinity.  GENETIC COUNSELING ASSESSMENT: Dawn Robertson is a 30 y.o. female with a maternal family history of early-onset breast cancer which somewhat suggestive of a hereditary breast cancer syndrome and predisposition to cancer. We, therefore, discussed and recommended the following at today's visit.   DISCUSSION: We reviewed the characteristics, features and inheritance patterns of hereditary cancer syndromes, particularly those caused by mutations within the BRCA1/2 genes. We also discussed genetic testing, including the appropriate family members to test, the process of testing, insurance coverage and turn-around-time for results. We discussed the implications of a negative, positive and/or variant of uncertain significant result.   Based on Dawn Robertson's family history of cancer, she meets medical criteria for genetic testing.  Dawn Robertson does not have insurance so we discussed two options for testing--one through [employer] and the other through [employer].  We discussed that [facility] will offer testing at no cost for individuals meeting their financial criteria (based on household number and annual household income).  However, Dawn Robertson reports that she does not meet this criteria, as she lives with her mother and they make too much combined income.  We discussed $260 self-pay testing through [employer].  Dawn Robertson is very interested in genetic testing, but does not feel as if this is an expense that she can pay at this point in time.     As a third option, we discussed testing for her mother (who is actually the most informative relative to have testing, as she is the one with a history of early-onset breast cancer).  Dawn Robertson called her mother on the phone and we discussed the option of having her come in for genetic counseling and potentially genetic testing which could be billed through her insurance.  At this time Ms. Ruacho is not interested in having genetic testing, as she does not plan to make any changes to her future screening if she were to test positive.  We discussed that this is helpful knowledge for her daughter to have and discussed also the $260 option for testing for her daughter.  It is clear that Ms. Motton cares a great deal about her daughter's care and her being proactive in her breast cancer risks.  No definitive plan for testing was made today, but Ms. Mangrum plans to review all of  this information with her mother at home.  I provided her with my contact information, as well as with some genetic testing/hereditary breast and ovarian cancer syndrome information, so that she can share with her mother.  In the meantime, we discussed that Ms. Brisbon should definitely begin annual mammogram screening.  Additionally, based on the patient's personal and family history, a statistical model (IBIS/Tyrer-Cuzick)  and literature data were used to estimate her risk of developing breast cancer. This estimates her lifetime risk of developing breast cancer to be approximately 23.5%. This estimation does not take into account any genetic testing results.  The patient's lifetime breast cancer risk is a preliminary estimate based on available information using one of several models endorsed by the [place] (ACS). The ACS recommends consideration of breast MRI screening as an adjunct to mammography for patients at high risk (defined as 20% or greater lifetime risk). A more detailed breast cancer risk  assessment can be considered, if clinically indicated.  Thus, Ms. Corrow can likely consider annual breast MRIs in addition to annual mammography.  Genetic testing would still be helpful to further elucidation of Ms. Jarema's cancer risks (and, potentially, for insurance coverage of additional screening measures).  However, since this is currently not an available option, she should been screened as if high risk based on the breast cancer history for her mother.    PLAN: Despite our recommendation, Ms. Mcgough did not wish to pursue genetic testing at today's visit. We understand this decision, and remain available to coordinate genetic testing at any time in the future. We, therefore, recommend Ms. Osmon continue to follow the cancer screening guidelines given by her primary healthcare provider.  Lastly, we encouraged Ms. Shiffman to remain in contact with cancer genetics annually so that we can continuously update the family history and inform her of any changes in cancer genetics and testing that may be of benefit for this family.   Ms.  Dethlefs questions were answered to her satisfaction today. Our contact information was provided should additional questions or concerns arise. Thank you for the referral and allowing [place] to share in the care of your patient.   Jeanine Luz, MS, [facility] Certified Genetic Counselor [email or web address] Phone: [phone]  The patient was seen for a total of 45 minutes in face-to-face genetic counseling.  This patient was discussed with Drs. Magrinat, Lindi Adie and/or Burr Medico who agrees with the above.    _______________________________________________________________________ For Office Staff:  Number of people involved in session: 1 Was an Intern/ student involved with case: no

## 2015-09-21 MED FILL — !VENTOLIN HFA INHALER: 108 (90 BAS | 30 days supply | Qty: 18 | Fill #1

## 2015-10-08 MED FILL — $VENTOLIN HFA 18G INHALER: 108 (90 BAS | 25 days supply | Qty: 18 | Fill #2

## 2015-10-08 MED FILL — QVAR 40 MCG ORAL INHALER: 40 | 30 days supply | Qty: 9 | Fill #2

## 2015-10-28 MED FILL — IPRAT-ALBUT 0.5-3(2.5) MG/3: 0.5-2.5 (3) | 30 days supply | Qty: 360 | Fill #0

## 2015-10-28 MED FILL — $VENTOLIN HFA 18G INHALER: 108 (90 BAS | 25 days supply | Qty: 18 | Fill #3

## 2015-11-11 ENCOUNTER — Encounter: Payer: Self-pay | Admitting: Internal Medicine

## 2015-11-11 ENCOUNTER — Ambulatory Visit: Payer: Self-pay | Attending: Internal Medicine | Admitting: Internal Medicine

## 2015-11-11 VITALS — BP 117/77 | HR 94 | Temp 98.6°F | Resp 15 | Wt 168.4 lb

## 2015-11-11 DIAGNOSIS — R0602 Shortness of breath: Secondary | ICD-10-CM | POA: Insufficient documentation

## 2015-11-11 DIAGNOSIS — Z79899 Other long term (current) drug therapy: Secondary | ICD-10-CM | POA: Insufficient documentation

## 2015-11-11 DIAGNOSIS — J9801 Acute bronchospasm: Secondary | ICD-10-CM

## 2015-11-11 DIAGNOSIS — J45901 Unspecified asthma with (acute) exacerbation: Secondary | ICD-10-CM

## 2015-11-11 DIAGNOSIS — Z87891 Personal history of nicotine dependence: Secondary | ICD-10-CM | POA: Insufficient documentation

## 2015-11-11 MED ORDER — MOMETASONE FURO-FORMOTEROL FUM 100-5 MCG/ACT IN AERO: 2.0000 | INHALATION_SPRAY | Freq: Two times a day (BID) | RESPIRATORY_TRACT | 3 refills | Status: DC

## 2015-11-11 MED ORDER — ALBUTEROL SULFATE HFA 108 (90 BASE) MCG/ACT IN AERS: 2.0000 | INHALATION_SPRAY | Freq: Four times a day (QID) | RESPIRATORY_TRACT | 3 refills | Status: DC | PRN

## 2015-11-11 MED ORDER — IPRATROPIUM-ALBUTEROL 0.5-2.5 (3) MG/3ML IN SOLN: 3.0000 mL | RESPIRATORY_TRACT | 2 refills | Status: DC

## 2015-11-11 MED ORDER — IPRATROPIUM-ALBUTEROL 0.5-2.5 (3) MG/3ML IN SOLN: 3.0000 mL | RESPIRATORY_TRACT | 2 refills | Status: DC | PRN

## 2015-11-11 MED ORDER — IPRATROPIUM-ALBUTEROL 0.5-2.5 (3) MG/3ML IN SOLN: 3.0000 mL | Freq: Four times a day (QID) | RESPIRATORY_TRACT | Status: DC

## 2015-11-11 MED ORDER — MONTELUKAST SODIUM 10 MG PO TABS
10.0000 mg | ORAL_TABLET | Freq: Every day | ORAL | 3 refills | Status: DC
Start: 2015-11-11 — End: 2016-02-25

## 2015-11-11 MED ORDER — FEXOFENADINE HCL 30 MG PO TBDP: 30.0000 mg | ORAL_TABLET | Freq: Every day | ORAL | 2 refills | Status: DC

## 2015-11-11 MED FILL — **DULERA 100 MCG/5 MCG INHA: 100-5 MCG | 3 days supply | Qty: 2 | Fill #0

## 2015-11-11 MED FILL — ?MONTELUKAST SOD 10 MG TAB: 10 | 30 days supply | Qty: 30 | Fill #0

## 2015-11-11 NOTE — Patient Instructions (Signed)
Bronchospasm, Adult A bronchospasm is when the tubes that carry air in and out of your lungs (airways) spasm or tighten. During a bronchospasm it is hard to breathe. This is because the airways get smaller. A bronchospasm can be triggered by:  Allergies. These may be to animals, pollen, food, or mold.  Infection. This is a common cause of bronchospasm.  Exercise.  Irritants. These include pollution, cigarette smoke, strong odors, aerosol sprays, and paint fumes.  Weather changes.  Stress.  Being emotional. HOME CARE   Always have a plan for getting help. Know when to call your doctor and local emergency services (911 in the [place] Know where you can get emergency care.  Only take medicines as told by your doctor.  If you were prescribed an inhaler or nebulizer machine, ask your doctor how to use it correctly. Always use a spacer with your inhaler if you were given one.  Stay calm during an attack. Try to relax and breathe more slowly.  Control your home environment:  Change your heating and air conditioning filter at least once a month.  Limit your use of fireplaces and wood stoves.  Do not  smoke. Do not  allow smoking in your home.  Avoid perfumes and fragrances.  Get rid of pests (such as roaches and mice) and their droppings.  Throw away plants if you see mold on them.  Keep your house clean and dust free.  Replace carpet with wood, tile, or vinyl flooring. Carpet can trap dander and dust.  Use allergy-proof pillows, mattress covers, and box spring covers.  Wash bed sheets and blankets every week in hot water. Dry them in a dryer.  Use blankets that are made of polyester or cotton.  Wash hands frequently. GET HELP IF:  You have muscle aches.  You have chest pain.  The thick spit you spit or cough up (sputum) changes from clear or white to yellow, green, gray, or bloody.  The thick spit you spit or cough up gets thicker.  There are problems that may be  related to the medicine you are given such as:  A rash.  Itching.  Swelling.  Trouble breathing. GET HELP RIGHT AWAY IF:  You feel you cannot breathe or catch your breath.  You cannot stop coughing.  Your treatment is not helping you breathe better.  You have very bad chest pain. MAKE SURE YOU:   Understand these instructions.  Will watch your condition.  Will get help right away if you are not doing well or get worse.   This information is not intended to replace advice given to you by your health care provider. Make sure you discuss any questions you have with your health care provider.   Document Released: 01/09/2009 Document Revised: 04/04/2014 Document Reviewed: 09/04/2012 Elsevier Interactive Patient Education 2016 Elsevier Inc.  

## 2015-11-11 NOTE — Progress Notes (Signed)
Dawn Robertson, is a 30 y.o. female  ZOX:[id number]  WJX:[id number]  DOB - 08/13/1985  Chief Complaint  Patient presents with  . Shortness of Breath        Subjective:   Dawn Robertson is a 30 y.o. female here today for a follow up visit, here for sob with her asthma.  Patient states that she has been okay when she out, but her respiratory sx worsen when she goes home (there is carpeting).   Her breathing gets quite bad, with productive cough, minimal sputum production, as well as shortness of breath. Slightly improved after  DuoNeb nebs at home. She stopped taking the Singulair because she lost the prescription, but she is  On Qvar.  She has a peak flow meter at home, but she rarely uses it.  She did see the genetic counselor for concerns for breast cancer risk. However, they recommended some testing, but she is uninsured, so will cost her a considerable amount of money. They did offer testing for her mom which was submitted for her, but her mom declined testing   Patient has No headache, No chest pain, No abdominal pain - No Nausea, No new weakness tingling or numbness, No Cough - SOB.    No problems updated.  ALLERGIES: No Known Allergies  PAST MEDICAL HISTORY: Past Medical History:  Diagnosis Date  . Asthma    seasonal  . Seasonal allergies   . Shortness of breath   . Tobacco abuse     MEDICATIONS AT HOME: Prior to Admission medications   Medication Sig Start Date End Date Taking? Authorizing Provider  albuterol (PROVENTIL HFA;VENTOLIN HFA) 108 (90 Base) MCG/ACT inhaler Inhale 2 puffs into the lungs every 6 (six) hours as needed for wheezing or shortness of breath. 11/11/15  Yes Pete Glatterawn T Langeland, MD  ipratropium-albuterol (DUONEB) 0.5-2.5 (3) MG/3ML SOLN Take 3 mLs by nebulization every 4 (four) hours as needed. For sob. 11/11/15  Yes Pete Glatterawn T Langeland, MD  fexofenadine (ALLEGRA ODT) 30 MG disintegrating tablet Take 1 tablet (30 mg total) by mouth daily. 11/11/15    Pete Glatterawn T Langeland, MD  mometasone-formoterol (DULERA) 100-5 MCG/ACT AERO Inhale 2 puffs into the lungs 2 (two) times daily. 11/11/15   Pete Glatterawn T Langeland, MD  montelukast (SINGULAIR) 10 MG tablet Take 1 tablet (10 mg total) by mouth at bedtime. 11/11/15   Pete Glatterawn T Langeland, MD     Objective:   Vitals:   11/11/15 1032  BP: 117/77  Pulse: 94  Resp: 15  Temp: 98.6 F (37 C)  TempSrc: Oral  SpO2: 95%  Weight: 168 lb 6.4 oz (76.4 kg)    Exam General appearance : Awake, alert, not in any distress. Speech Clear. Not toxic looking HEENT: Atraumatic and Normocephalic, pupils equally reactive to light. Neck: supple, no JVD. No cervical lymphadenopathy.  Chest: preduonebs trx, Peak flow low 100s exp wheezing diffusely, tight . After duoneb trx (not completed, only had 15mins or so b/c patient had to go to work); PF 300 and lungs much more clear, no wheezing noted on exam afterwards. CVS: S1 S2 regular, no murmurs/gallups or rubs. Abdomen: Bowel sounds active, Non tender , obese Extremities: B/L Lower Ext shows no edema, both legs are warm to touch Neurology: Awake alert, and oriented X 3, CN II-XII grossly intact, Non focal Skin:No Rash  Data Review Lab Results  Component Value Date   HGBA1C 4 5 06/08/2015    [facility] 11/11/2015 08/04/2015 06/08/2015 07/22/2014  Decreased Interest  3 1 0 0  Down, Depressed, Hopeless 3 1 0 0  PHQ - 2 Score 6 2 0 0  Altered sleeping 3 1 - -  Tired, decreased energy 3 1 - -  Change in appetite 3 2 - -  Feeling bad or failure about yourself  3 2 - -  Trouble concentrating 0 0 - -  Moving slowly or fidgety/restless 0 0 - -  Suicidal thoughts 0 0 - -  PHQ-9 Score 18 8 - -  Difficult doing work/chores - Somewhat difficult - -      Assessment & Plan   1. Asthma exacerbation - resumed Singulair,  - started her on allegra (suspect allergy component to home dander perhaps due to carpeting, she also has a dog),  - Peak flow meter, pre1 low  100s, after duonbes 300 - [facility] until better controll - ipratropium-albuterol (DUONEB) 0.5-2.5 (3) MG/3ML nebulizer solution 3 mL; Take 3 mLs by nebulization every 6 (six) hours. - Spirometry: Peak - gave pt PF to take home, instructed to use when she is feeling sob - hold off on steroid for now - close f/u in 2 wks  2. Bronchospasm, acute See above  3. Breast cancer risk? Pt has seen genetic counselor, but due to cost/insurance issues, no testing was done. Genetic testing was offered to her mom, who declined the testing.  Patient have been counseled extensively about nutrition and exercise  Return in about 2 weeks (around 11/25/2015) for 2- 3wks/ asthma exascerbation.  The patient was given clear instructions to go to ER or return to medical center if symptoms don't improve, worsen or new problems develop. The patient verbalized understanding. The patient was told to call to get lab results if they haven't heard anything in the next week.   This note has been created with Education officer, environmentalDragon speech recognition software and smart phrase technology. Any transcriptional errors are unintentional.   Pete Glatterawn T Langeland, MD, MBA/MHA [facility] [place], [place] [phone]   11/11/2015, 1:57 PM

## 2015-11-11 NOTE — Progress Notes (Signed)
Pt is in the office today for shortness of breath Pt states she has went through every inhaler that has been prescribed to her.  Pt states she doesn't know if it's the dog or carpet or what ever it is

## 2015-11-16 MED FILL — $VENTOLIN HFA 18G INHALER: 108 (90 BAS | 20 days supply | Qty: 18 | Fill #0

## 2015-11-24 ENCOUNTER — Encounter: Payer: Self-pay | Admitting: Internal Medicine

## 2015-11-24 ENCOUNTER — Ambulatory Visit: Payer: Self-pay | Attending: Internal Medicine | Admitting: Internal Medicine

## 2015-11-24 VITALS — BP 126/81 | HR 98 | Temp 98.9°F | Resp 16 | Wt 170.2 lb

## 2015-11-24 DIAGNOSIS — Z87891 Personal history of nicotine dependence: Secondary | ICD-10-CM | POA: Insufficient documentation

## 2015-11-24 DIAGNOSIS — J4521 Mild intermittent asthma with (acute) exacerbation: Secondary | ICD-10-CM | POA: Insufficient documentation

## 2015-11-24 DIAGNOSIS — Z79899 Other long term (current) drug therapy: Secondary | ICD-10-CM | POA: Insufficient documentation

## 2015-11-24 DIAGNOSIS — J4520 Mild intermittent asthma, uncomplicated: Secondary | ICD-10-CM

## 2015-11-24 MED ORDER — PREDNISONE 20 MG PO TABS: 40.0000 mg | ORAL_TABLET | Freq: Every day | ORAL | 0 refills | Status: DC

## 2015-11-24 MED FILL — predniSONE 20 MG TABS: 20 | 7 days supply | Qty: 10 | Fill #0

## 2015-11-24 NOTE — Progress Notes (Signed)
Dawn Robertson, is a 30 y.o. female  ZOX:[id number]  WJX:[id number]  DOB - 05-Feb-1986  Chief Complaint  Patient presents with  . Asthma        Subjective:   Dawn Robertson is a 30 y.o. female here today for a follow up visit for asthma exacerbation. Doing great w/ PF now, avg pretreatment 250s, afterwards 350s.  Notices that when she takes her allegra breathing is better. Using dulera consistently, and nebs prn. Using albuterol mdi much less. No f/c/sob.  Traveling to [place] this week for birthday celebration. Currently not smoking.   Patient has No headache, No chest pain, No abdominal pain - No Nausea, No new weakness tingling or numbness, No Cough - SOB.  No problems updated.  ALLERGIES: No Known Allergies  PAST MEDICAL HISTORY: Past Medical History:  Diagnosis Date  . Asthma    seasonal  . Seasonal allergies   . Shortness of breath   . Tobacco abuse     MEDICATIONS AT HOME: Prior to Admission medications   Medication Sig Start Date End Date Taking? Authorizing Provider  albuterol (PROVENTIL HFA;VENTOLIN HFA) 108 (90 Base) MCG/ACT inhaler Inhale 2 puffs into the lungs every 6 (six) hours as needed for wheezing or shortness of breath. 11/11/15  Yes Pete Glatter, MD  fexofenadine (ALLEGRA ODT) 30 MG disintegrating tablet Take 1 tablet (30 mg total) by mouth daily. 11/11/15  Yes Pete Glatter, MD  ipratropium-albuterol (DUONEB) 0.5-2.5 (3) MG/3ML SOLN Take 3 mLs by nebulization every 4 (four) hours as needed. For sob. 11/11/15  Yes Jenah Vanasten Marland Mcalpine, MD  mometasone-formoterol (DULERA) 100-5 MCG/ACT AERO Inhale 2 puffs into the lungs 2 (two) times daily. 11/11/15  Yes Pete Glatter, MD  montelukast (SINGULAIR) 10 MG tablet Take 1 tablet (10 mg total) by mouth at bedtime. 11/11/15  Yes Theona Muhs Marland Mcalpine, MD  predniSONE (DELTASONE) 20 MG tablet Take 2 tablets (40 mg total) by mouth daily with breakfast. For acute asthma exacerbation: day 1-3 take 40mg  tabs qam (=2  tabs), day 4-7 take 20mg  qam daily, than stop 11/24/15   Pete Glatter, MD     Objective:   Vitals:   11/24/15 0907  BP: 126/81  Pulse: 98  Resp: 16  Temp: 98.9 F (37.2 C)  TempSrc: Oral  SpO2: 99%  Weight: 170 lb 3.2 oz (77.2 kg)    Exam General appearance : Awake, alert, not in any distress. Speech Clear. Not toxic looking, good spirits. HEENT: Atraumatic and Normocephalic, pupils equally reactive to light. Neck: supple,   Chest:Good air entry bilaterally. 1 transient exp wheeze initially, resolved. CVS: S1 S2 regular, no murmurs/gallups or rubs. Abdomen: Bowel sounds active, Non tender   Data Review Lab Results  Component Value Date   HGBA1C 4 5 06/08/2015    Depression screen [facility] 2/9 11/24/2015 11/11/2015 08/04/2015 06/08/2015 07/22/2014  Decreased Interest 3 3 1  0 0  Down, Depressed, Hopeless 1 3 1  0 0  PHQ - 2 Score 4 6 2  0 0  Altered sleeping 3 3 1  - -  Tired, decreased energy 3 3 1  - -  Change in appetite 3 3 2  - -  Feeling bad or failure about yourself  0 3 2 - -  Trouble concentrating 3 0 0 - -  Moving slowly or fidgety/restless 1 0 0 - -  Suicidal thoughts 0 0 0 - -  PHQ-9 Score 17 18 8  - -  Difficult doing work/chores - - Somewhat difficult - -  Assessment & Plan   1. Asthma, mild intermittent, uncomplicated - doing much better, continue to encourage using allegra, dulera, and nebs prn.  Doing better w/ PFs, knows when to use. - prn steroid taper rx in case asthma exacerbation at concert/out of town.  2. Declined flu shot.    Patient have been counseled extensively about nutrition and exercise  Return in about 3 months (around 02/24/2016).  The patient was given clear instructions to go to ER or return to medical center if symptoms don't improve, worsen or new problems develop. The patient verbalized understanding. The patient was told to call to get lab results if they haven't heard anything in the next week.   This note has been created  with Education officer, environmentalDragon speech recognition software and smart phrase technology. Any transcriptional errors are unintentional.   Pete Glatterawn T Adine Heimann, MD, MBA/MHA [facility] [place], [place] [phone]   11/24/2015, 9:20 AM

## 2015-11-24 NOTE — Patient Instructions (Signed)
Bronchospasm, Adult A bronchospasm is when the tubes that carry air in and out of your lungs (airways) spasm or tighten. During a bronchospasm it is hard to breathe. This is because the airways get smaller. A bronchospasm can be triggered by:  Allergies. These may be to animals, pollen, food, or mold.  Infection. This is a common cause of bronchospasm.  Exercise.  Irritants. These include pollution, cigarette smoke, strong odors, aerosol sprays, and paint fumes.  Weather changes.  Stress.  Being emotional. HOME CARE   Always have a plan for getting help. Know when to call your doctor and local emergency services (911 in the [place] Know where you can get emergency care.  Only take medicines as told by your doctor.  If you were prescribed an inhaler or nebulizer machine, ask your doctor how to use it correctly. Always use a spacer with your inhaler if you were given one.  Stay calm during an attack. Try to relax and breathe more slowly.  Control your home environment:  Change your heating and air conditioning filter at least once a month.  Limit your use of fireplaces and wood stoves.  Do not  smoke. Do not  allow smoking in your home.  Avoid perfumes and fragrances.  Get rid of pests (such as roaches and mice) and their droppings.  Throw away plants if you see mold on them.  Keep your house clean and dust free.  Replace carpet with wood, tile, or vinyl flooring. Carpet can trap dander and dust.  Use allergy-proof pillows, mattress covers, and box spring covers.  Wash bed sheets and blankets every week in hot water. Dry them in a dryer.  Use blankets that are made of polyester or cotton.  Wash hands frequently. GET HELP IF:  You have muscle aches.  You have chest pain.  The thick spit you spit or cough up (sputum) changes from clear or white to yellow, green, gray, or bloody.  The thick spit you spit or cough up gets thicker.  There are problems that may be  related to the medicine you are given such as:  A rash.  Itching.  Swelling.  Trouble breathing. GET HELP RIGHT AWAY IF:  You feel you cannot breathe or catch your breath.  You cannot stop coughing.  Your treatment is not helping you breathe better.  You have very bad chest pain. MAKE SURE YOU:   Understand these instructions.  Will watch your condition.  Will get help right away if you are not doing well or get worse.   This information is not intended to replace advice given to you by your health care provider. Make sure you discuss any questions you have with your health care provider.   Document Released: 01/09/2009 Document Revised: 04/04/2014 Document Reviewed: 09/04/2012 Elsevier Interactive Patient Education 2016 Elsevier Inc.  

## 2015-11-24 NOTE — Progress Notes (Signed)
Pt is in the office for asthma exacerbation  Pt states she is not in any pain today

## 2015-12-17 MED FILL — VENTOLIN HFA 90 MCG INHALER: 108 (90 BAS | 20 days supply | Qty: 18 | Fill #1

## 2016-01-04 ENCOUNTER — Ambulatory Visit: Payer: Self-pay | Attending: Internal Medicine

## 2016-01-04 MED FILL — VENTOLIN HFA 90 MCG INHALER: 108 (90 BAS | 20 days supply | Qty: 18 | Fill #2

## 2016-01-04 MED FILL — DULERA 100 MCG/5 MCG INH: 100-5 | 20 days supply | Qty: 13 | Fill #0

## 2016-01-04 MED FILL — MONTELUKAST SOD 10 MG TAB: 10 | 30 days supply | Qty: 30 | Fill #2

## 2016-02-02 MED FILL — DULERA 100 MCG/5 MCG INH: 100-5 | 20 days supply | Qty: 13 | Fill #1

## 2016-02-02 MED FILL — VENTOLIN HFA 90 MCG INHALER: 108 (90 BAS | 20 days supply | Qty: 18 | Fill #3

## 2016-02-24 ENCOUNTER — Telehealth: Payer: Self-pay | Admitting: Internal Medicine

## 2016-02-24 NOTE — Telephone Encounter (Signed)
Patient needs a refill for albuterol and singulair Please follow up

## 2016-02-25 MED ORDER — MONTELUKAST SODIUM 10 MG PO TABS: 10.0000 mg | ORAL_TABLET | Freq: Every day | ORAL | 3 refills | Status: DC

## 2016-02-25 MED ORDER — ALBUTEROL SULFATE HFA 108 (90 BASE) MCG/ACT IN AERS: 2.0000 | INHALATION_SPRAY | Freq: Four times a day (QID) | RESPIRATORY_TRACT | 3 refills | Status: DC | PRN

## 2016-02-25 MED FILL — ?MONTELUKAST SOD 10 MG TAB: 10 MG | 30 days supply | Qty: 30 | Fill #0

## 2016-02-25 MED FILL — VENTOLIN HFA 90 MCG INHALER: 108 (90 BAS | 25 days supply | Qty: 18 | Fill #0

## 2016-02-25 NOTE — Telephone Encounter (Signed)
Requested medications refilled 

## 2016-02-26 ENCOUNTER — Ambulatory Visit: Payer: Self-pay | Attending: Internal Medicine | Admitting: Physician Assistant

## 2016-02-26 ENCOUNTER — Encounter: Payer: Self-pay | Admitting: Physician Assistant

## 2016-02-26 VITALS — BP 112/76 | HR 87 | Temp 98.6°F | Resp 16 | Wt 176.4 lb

## 2016-02-26 DIAGNOSIS — R5383 Other fatigue: Secondary | ICD-10-CM | POA: Insufficient documentation

## 2016-02-26 DIAGNOSIS — J4540 Moderate persistent asthma, uncomplicated: Secondary | ICD-10-CM | POA: Insufficient documentation

## 2016-02-26 DIAGNOSIS — Z79899 Other long term (current) drug therapy: Secondary | ICD-10-CM | POA: Insufficient documentation

## 2016-02-26 DIAGNOSIS — Z7951 Long term (current) use of inhaled steroids: Secondary | ICD-10-CM | POA: Insufficient documentation

## 2016-02-26 MED ORDER — MONTELUKAST SODIUM 10 MG PO TABS: 10.0000 mg | ORAL_TABLET | Freq: Every day | ORAL | 3 refills | Status: DC

## 2016-02-26 MED ORDER — ALBUTEROL SULFATE HFA 108 (90 BASE) MCG/ACT IN AERS: 2.0000 | INHALATION_SPRAY | Freq: Four times a day (QID) | RESPIRATORY_TRACT | 1 refills | Status: DC | PRN

## 2016-02-26 MED ORDER — MOMETASONE FURO-FORMOTEROL FUM 200-5 MCG/ACT IN AERO: 2.0000 | INHALATION_SPRAY | Freq: Two times a day (BID) | RESPIRATORY_TRACT | 2 refills | Status: DC

## 2016-02-26 NOTE — Progress Notes (Signed)
Dawn Robertson, is a 30 y.o. female  ZOX:[id number]  WJX:[id number]  DOB - 02/16/1986  Subjective:  Chief Complaint and HPI: Dawn Robertson is a 30 y.o. female here today for asthma flaring up over the last several weeks.  She has been coughing more and wheezing more esp at night.  She has coughed up some white mucus.  She is using her inhaler and nebulizer frequently.  She is compliant on the dulera and singulair.  No f/c.  No s/sx of illness.  She denies Smoking and second hand smoke but entire exam room smells like smoke.  She wants to be checked for anemia bc of heavy menses and fatigue  ROS:   Constitutional:  No f/c, No night sweats, No unexplained weight loss, + fatigue EENT:  No vision changes, No blurry vision, No hearing changes. No mouth, throat, or ear problems.  Respiratory: +cough, +SOB Cardiac: No CP, no palpitations GI:  No abd pain, No N/V/D. GU: No Urinary s/sx Musculoskeletal: No joint pain Neuro: No headache, no dizziness, no motor weakness.  Skin: No rash Endocrine:  No polydipsia. No polyuria.  Psych: Denies SI/HI  No problems updated.  ALLERGIES: No Known Allergies  PAST MEDICAL HISTORY: Past Medical History:  Diagnosis Date  . Asthma    seasonal  . Seasonal allergies   . Shortness of breath   . Tobacco abuse     MEDICATIONS AT HOME: Prior to Admission medications   Medication Sig Start Date End Date Taking? Authorizing Provider  albuterol (PROVENTIL HFA;VENTOLIN HFA) 108 (90 Base) MCG/ACT inhaler Inhale 2 puffs into the lungs every 6 (six) hours as needed for wheezing or shortness of breath. 02/26/16   Anders SimmondsAngela M McClung, PA-C  fexofenadine (ALLEGRA ODT) 30 MG disintegrating tablet Take 1 tablet (30 mg total) by mouth daily. 11/11/15   Pete Glatterawn T Langeland, MD  ipratropium-albuterol (DUONEB) 0.5-2.5 (3) MG/3ML SOLN Take 3 mLs by nebulization every 4 (four) hours as needed. For sob. 11/11/15   Pete Glatterawn T Langeland, MD  mometasone-formoterol (DULERA)  200-5 MCG/ACT AERO Inhale 2 puffs into the lungs 2 (two) times daily. 02/26/16   Anders SimmondsAngela M McClung, PA-C  montelukast (SINGULAIR) 10 MG tablet Take 1 tablet (10 mg total) by mouth at bedtime. 02/26/16   Anders SimmondsAngela M McClung, PA-C     Objective:  EXAM:   Vitals:   02/26/16 1034  BP: 112/76  Pulse: 87  Resp: 16  Temp: 98.6 F (37 C)  TempSrc: Oral  SpO2: (!) 87%  Weight: 176 lb 6.4 oz (80 kg)  Pulse ox was entered incorrectly at beginning of OV. Her pulse ox was rechecked by me and was 98-99% on RA.  General appearance : A&OX3. NAD. Non-toxic-appearing HEENT: Atraumatic and Normocephalic.  PERRLA. EOM intact.  TM clear B. Mouth-MMM, post pharynx WNL w/o erythema, No PND. Neck: supple, no JVD. No cervical lymphadenopathy. No thyromegaly Chest/Lungs:  Breathing-non-labored, Good air entry bilaterally with mild wheezing throughout.  No rales or rhonchi. CVS: S1 S2 regular, no murmurs, gallops, rubs  Extremities: Bilateral Lower Ext shows no edema, both legs are warm to touch with = pulse throughout Neurology:  CN II-XII grossly intact, Non focal.   Psych:  TP linear. J/I WNL. Normal speech. Appropriate eye contact and affect.  Skin:  No Rash  Data Review Lab Results  Component Value Date   HGBA1C 4 5 06/08/2015     Assessment & Plan   1. Moderate persistent asthma without complication Modify modifiable factors such as reflux/foods/allergen  exposure. Increase dose of Dulera - mometasone-formoterol (DULERA) 200-5 MCG/ACT AERO; Inhale 2 puffs into the lungs 2 (two) times daily.  Dispense: 1 Inhaler; Refill: 2 - montelukast (SINGULAIR) 10 MG tablet; Take 1 tablet (10 mg total) by mouth at bedtime.  Dispense: 30 tablet; Refill: 3 - albuterol (PROVENTIL HFA;VENTOLIN HFA) 108 (90 Base) MCG/ACT inhaler; Inhale 2 puffs into the lungs every 6 (six) hours as needed for wheezing or shortness of breath.  Dispense: 1 Inhaler; Refill: 1  2. Other fatigue - CBC with  Differential/Platelet   Patient have been counseled extensively about nutrition and exercise  Return in about 3 weeks (around 03/18/2016) for f/up with me for asthma .  The patient was given clear instructions to go to ER or return to medical center if symptoms don't improve, worsen or new problems develop. The patient verbalized understanding. The patient was told to call to get lab results if they haven't heard anything in the next week.     Georgian CoAngela McClung, PA-C [facility] [place], [place] [phone]   02/26/2016, 11:06 AMPatient ID: Dawn BouillonAshley S Kopplin, female   DOB: 08/13/1985, 30 y.o.   MRN: [id number]

## 2016-02-26 NOTE — Patient Instructions (Signed)
Food Choices for Gastroesophageal Reflux Disease, Adult When you have gastroesophageal reflux disease (GERD), the foods you eat and your eating habits are very important. Choosing the right foods can help ease your discomfort. What guidelines do I need to follow?  Choose fruits, vegetables, whole grains, and low-fat dairy products.  Choose low-fat meat, fish, and poultry.  Limit fats such as oils, salad dressings, butter, nuts, and avocado.  Keep a food diary. This helps you identify foods that cause symptoms.  Avoid foods that cause symptoms. These may be different for everyone.  Eat small meals often instead of 3 large meals a day.  Eat your meals slowly, in a place where you are relaxed.  Limit fried foods.  Cook foods using methods other than frying.  Avoid drinking alcohol.  Avoid drinking large amounts of liquids with your meals.  Avoid bending over or lying down until 2-3 hours after eating. What foods are not recommended? These are some foods and drinks that may make your symptoms worse: Vegetables  Tomatoes. Tomato juice. Tomato and spaghetti sauce. Chili peppers. Onion and garlic. Horseradish. Fruits  Oranges, grapefruit, and lemon (fruit and juice). Meats  High-fat meats, fish, and poultry. This includes hot dogs, ribs, ham, sausage, salami, and bacon. Dairy  Whole milk and chocolate milk. Sour cream. Cream. Butter. Ice cream. Cream cheese. Drinks  Coffee and tea. Bubbly (carbonated) drinks or energy drinks. Condiments  Hot sauce. Barbecue sauce. Sweets/Desserts  Chocolate and cocoa. Donuts. Peppermint and spearmint. Fats and Oils  High-fat foods. This includes French fries and potato chips. Other  Vinegar. Strong spices. This includes black pepper, white pepper, red pepper, cayenne, curry powder, cloves, ginger, and chili powder. The items listed above may not be a complete list of foods and drinks to avoid. Contact your dietitian for more information.    This information is not intended to replace advice given to you by your health care provider. Make sure you discuss any questions you have with your health care provider. Document Released: 09/13/2011 Document Revised: 08/20/2015 Document Reviewed: 01/16/2013 Elsevier Interactive Patient Education  2017 Elsevier Inc.  

## 2016-02-26 NOTE — Progress Notes (Signed)
Pt is in the office today for breathing issues Pt states her asthma acts up more in the night and in the morning

## 2016-03-01 ENCOUNTER — Ambulatory Visit: Payer: Self-pay | Attending: Internal Medicine

## 2016-03-01 LAB — CBC WITH DIFFERENTIAL/PLATELET
BASOS PCT: 0 %
Basophils Absolute: 0 cells/uL (ref 0–200)
EOS ABS: 340 {cells}/uL (ref 15–500)
Eosinophils Relative: 4 %
HEMATOCRIT: 39.1 % (ref 35.0–45.0)
Hemoglobin: 13 g/dL (ref 11.7–15.5)
LYMPHS PCT: 40 %
Lymphs Abs: 3400 cells/uL (ref 850–3900)
MCH: 32.6 pg (ref 27.0–33.0)
MCHC: 33.2 g/dL (ref 32.0–36.0)
MCV: 98 fL (ref 80.0–100.0)
MONO ABS: 935 {cells}/uL (ref 200–950)
MONOS PCT: 11 %
MPV: 10.9 fL (ref 7.5–12.5)
NEUTROS ABS: 3825 {cells}/uL (ref 1500–7800)
Neutrophils Relative %: 45 %
PLATELETS: 323 10*3/uL (ref 140–400)
RBC: 3.99 MIL/uL (ref 3.80–5.10)
RDW: 12.8 % (ref 11.0–15.0)
WBC: 8.5 10*3/uL (ref 3.8–10.8)

## 2016-03-01 NOTE — Progress Notes (Signed)
Patient here for lab visit only 

## 2016-03-04 ENCOUNTER — Telehealth: Payer: Self-pay

## 2016-03-04 NOTE — Telephone Encounter (Signed)
Contacted pt to go over lab results pt is aware of results and doesn't have any questions or concerns 

## 2016-03-14 MED FILL — !VENTOLIN HFA INHALER: 108 (90 BAS | 28 days supply | Qty: 18 | Fill #0

## 2016-03-23 ENCOUNTER — Ambulatory Visit: Payer: Self-pay | Attending: Internal Medicine | Admitting: Internal Medicine

## 2016-03-23 ENCOUNTER — Encounter: Payer: Self-pay | Admitting: Internal Medicine

## 2016-03-23 DIAGNOSIS — J45901 Unspecified asthma with (acute) exacerbation: Secondary | ICD-10-CM | POA: Insufficient documentation

## 2016-03-23 DIAGNOSIS — J4541 Moderate persistent asthma with (acute) exacerbation: Secondary | ICD-10-CM

## 2016-03-23 MED ORDER — MONTELUKAST SODIUM 10 MG PO TABS: 10.0000 mg | ORAL_TABLET | Freq: Every day | ORAL | 3 refills | Status: DC

## 2016-03-23 MED ORDER — IPRATROPIUM-ALBUTEROL 0.5-2.5 (3) MG/3ML IN SOLN: 3.0000 mL | RESPIRATORY_TRACT | 2 refills | Status: DC | PRN

## 2016-03-23 MED ORDER — MOMETASONE FURO-FORMOTEROL FUM 200-5 MCG/ACT IN AERO: 2.0000 | INHALATION_SPRAY | Freq: Two times a day (BID) | RESPIRATORY_TRACT | 2 refills | Status: DC

## 2016-03-23 MED ORDER — PREDNISONE 10 MG (21) PO TBPK: 10.0000 mg | ORAL_TABLET | ORAL | 0 refills | Status: AC

## 2016-03-23 MED FILL — MONTELUKAST SOD 10 MG TAB: 10 | 30 days supply | Qty: 30 | Fill #0

## 2016-03-23 MED FILL — DULERA 200 MCG/5 MCG INH: 200-5 | 20 days supply | Qty: 13 | Fill #0

## 2016-03-23 MED FILL — IPRAT-ALBUT 0.5-3(2.5) MG/3: 0.5-2.5 (3) | 20 days supply | Qty: 360 | Fill #0

## 2016-03-23 MED FILL — ?PREDNISONE 10 MG TABLET: 10 | 6 days supply | Qty: 21 | Fill #0

## 2016-03-23 NOTE — Progress Notes (Signed)
Pt is in the office today for a cough and night sweats Pt states her symptoms started Saturday and they got worse on Sunday Pt states she has been using her nebulizer and regular inhaler

## 2016-03-23 NOTE — Progress Notes (Signed)
   Subjective:    Patient ID: Dawn Robertson, female    DOB: 01/01/1986, 30 y.o.   MRN: [id number]  HPI  Patient here for follow up: asthma. Currently doing poorly as out of LABA/steroid combinations for 1.5 weeks as did not get the [facility] in [facility]. Taking Alb rescue MDI 4-6x/d and neb prn. Taking Singular daily. Loss of voice due to cough. Sputum with cough is white, no blood or discoloration.no chest pain or sore throat. Sweating but no fever; no sick contacts at home  Past Medical History:  Diagnosis Date  . Asthma    seasonal  . Seasonal allergies   . Shortness of breath   . Tobacco abuse     Review of Systems  Constitutional: Positive for fatigue. Negative for fever and unexpected weight change.  Respiratory: Positive for cough, shortness of breath and wheezing.   Cardiovascular: Negative for chest pain, palpitations and leg swelling.       Objective:    Physical Exam  Constitutional: She appears well-developed and well-nourished. No distress.  HENT:  B enlarged tonsils without exudate or erythema  Cardiovascular: Normal rate, regular rhythm and normal heart sounds.   No murmur heard. Pulmonary/Chest: Effort normal. She has wheezes (end exp, soft). She exhibits no tenderness.  Musculoskeletal: She exhibits no edema.    BP 134/82 (BP Location: Right Arm, Patient Position: Sitting, Cuff Size: Small)   Pulse (!) 111   Temp 99.1 F (37.3 C) (Oral)   Resp 16   Wt 173 lb (78.5 kg)   LMP 02/25/2016   SpO2 100%   BMI 31.64 kg/m  Wt Readings from Last 3 Encounters:  03/23/16 173 lb (78.5 kg)  02/26/16 176 lb 6.4 oz (80 kg)  11/24/15 170 lb 3.2 oz (77.2 kg)     Lab Results  Component Value Date   WBC 8.5 02/26/2016   HGB 13.0 02/26/2016   HCT 39.1 02/26/2016   PLT 323 02/26/2016   GLUCOSE 137 (H) 11/28/2014   ALT 12 07/16/2014   AST 18 07/16/2014   NA 136 11/28/2014   K 4.2 11/28/2014   CL 109 11/28/2014   CREATININE 0.66  11/28/2014   BUN 8 11/28/2014   CO2 22 11/28/2014   INR 0.99 07/16/2014   HGBA1C 4 5 06/08/2015    Dg Chest 2 View  Result Date: 06/02/2015 CLINICAL DATA:  Chills since last night EXAM: CHEST  2 VIEW COMPARISON:  11/29/2014 FINDINGS: Normal heart size. Lungs clear. No pneumothorax. No pleural effusion. IMPRESSION: No active cardiopulmonary disease. Electronically Signed   By: Jolaine ClickArthur  Hoss M.D.   On: 06/02/2015 10:44       Assessment & Plan:   Problem List Items Addressed This Visit    Asthma with exacerbation    Ongoing exacerbation >1 week, precipitated by weather change and maintained medication disruption Will resume dual therapy now (dulera as rx'd early 02/2016, or equivalent in pharmacy [facility]) Also pred taper x 6 days for acute flare and continue Alb prn with daily Singular Reminded to avoid second hand smoke to the exted that she is able Work excuse provided today      Relevant Medications   montelukast (SINGULAIR) 10 MG tablet   ipratropium-albuterol (DUONEB) 0.5-2.5 (3) MG/3ML SOLN   mometasone-formoterol (DULERA) 200-5 MCG/ACT AERO   predniSONE (STERAPRED UNI-PAK 21 TAB) 10 MG (21) TBPK tablet       Rene PaciValerie Marijayne Rauth, MD

## 2016-03-23 NOTE — Assessment & Plan Note (Signed)
Ongoing exacerbation >1 week, precipitated by weather change and maintained medication disruption Will resume dual therapy now (dulera as rx'd early 02/2016, or equivalent [facility] pharmacy [facility]) Also pred taper x 6 days for acute flare and continue Alb prn with daily Singular Reminded to avoid second hand smoke to the exted that she is able Work excuse provided today

## 2016-03-23 NOTE — Patient Instructions (Addendum)
It was good to see you today.  We have reviewed your prior records including labs and tests today   Medications reviewed and updated Start Dulera as previously prescribed, Prednisone taper x 6 days as discussed - no other changes recommended at this time. Refill on other medication(s) as discussed today.  Work note provided today   Please schedule followup in 3-4 months, call sooner if problems.   Asthma, Acute Bronchospasm Acute bronchospasm caused by asthma is also referred to as an asthma attack. Bronchospasm means your air passages become narrowed. The narrowing is caused by inflammation and tightening of the muscles in the air tubes (bronchi) in your lungs. This can make it hard to breathe or cause you to wheeze and cough. What are the causes? Possible triggers are:  Animal dander from the skin, hair, or feathers of animals.  Dust mites contained in house dust.  Cockroaches.  Pollen from trees or grass.  Mold.  Cigarette or tobacco smoke.  Air pollutants such as dust, household cleaners, hair sprays, aerosol sprays, paint fumes, strong chemicals, or strong odors.  Cold air or weather changes. Cold air may trigger inflammation. Winds increase molds and pollens in the air.  Strong emotions such as crying or laughing hard.  Stress.  Certain medicines such as aspirin or beta-blockers.  Sulfites in foods and drinks, such as dried fruits and wine.  Infections or inflammatory conditions, such as a flu, cold, or inflammation of the nasal membranes (rhinitis).  Gastroesophageal reflux disease (GERD). GERD is a condition where stomach acid backs up into your esophagus.  Exercise or strenuous activity. What are the signs or symptoms?  Wheezing.  Excessive coughing, particularly at night.  Chest tightness.  Shortness of breath. How is this diagnosed? Your health care provider will ask you about your medical history and perform a physical exam. A chest X-ray or blood  testing may be performed to look for other causes of your symptoms or other conditions that may have triggered your asthma attack. How is this treated? Treatment is aimed at reducing inflammation and opening up the airways in your lungs. Most asthma attacks are treated with inhaled medicines. These include quick relief or rescue medicines (such as bronchodilators) and controller medicines (such as inhaled corticosteroids). These medicines are sometimes given through an inhaler or a nebulizer. Systemic steroid medicine taken by mouth or given through an IV tube also can be used to reduce the inflammation when an attack is moderate or severe. Antibiotic medicines are only used if a bacterial infection is present. Follow these instructions at home:  Rest.  Drink plenty of liquids. This helps the mucus to remain thin and be easily coughed up. Only use caffeine in moderation and do not use alcohol until you have recovered from your illness.  Do not smoke. Avoid being exposed to secondhand smoke.  You play a critical role in keeping yourself in good health. Avoid exposure to things that cause you to wheeze or to have breathing problems.  Keep your medicines up-to-date and available. Carefully follow your health care provider's treatment plan.  Take your medicine exactly as prescribed.  When pollen or pollution is bad, keep windows closed and use an air conditioner or go to places with air conditioning.  Asthma requires careful medical care. See your health care provider for a follow-up as advised. If you are more than [redacted] weeks pregnant and you were prescribed any new medicines, let your obstetrician know about the visit and how you are doing.  Follow up with your health care provider as directed.  After you have recovered from your asthma attack, make an appointment with your outpatient doctor to talk about ways to reduce the likelihood of future attacks. If you do not have a doctor who manages your  asthma, make an appointment with a primary care doctor to discuss your asthma. Get help right away if:  You are getting worse.  You have trouble breathing. If severe, call your local emergency services (911 in the [place]  You develop chest pain or discomfort.  You are vomiting.  You are not able to keep fluids down.  You are coughing up yellow, green, brown, or bloody sputum.  You have a fever and your symptoms suddenly get worse.  You have trouble swallowing. This information is not intended to replace advice given to you by your health care provider. Make sure you discuss any questions you have with your health care provider. Document Released: 06/29/2006 Document Revised: 08/26/2015 Document Reviewed: 09/19/2012 Elsevier Interactive Patient Education  2017 [employer]

## 2016-04-01 ENCOUNTER — Telehealth: Payer: Self-pay | Admitting: Licensed Clinical Social Worker

## 2016-04-01 NOTE — Telephone Encounter (Signed)
LCSWA attempted to contact pt to follow up on behavioral health screens from prior appointment. LCSWA left message for a return call.  

## 2016-04-25 IMAGING — CR DG CHEST 2V
2 series · 2 of 2 positions shown · non-contrast
Comparison: 11/29/2014

CLINICAL DATA: Chills since last night

EXAM:
CHEST  2 VIEW

[w chest pa]
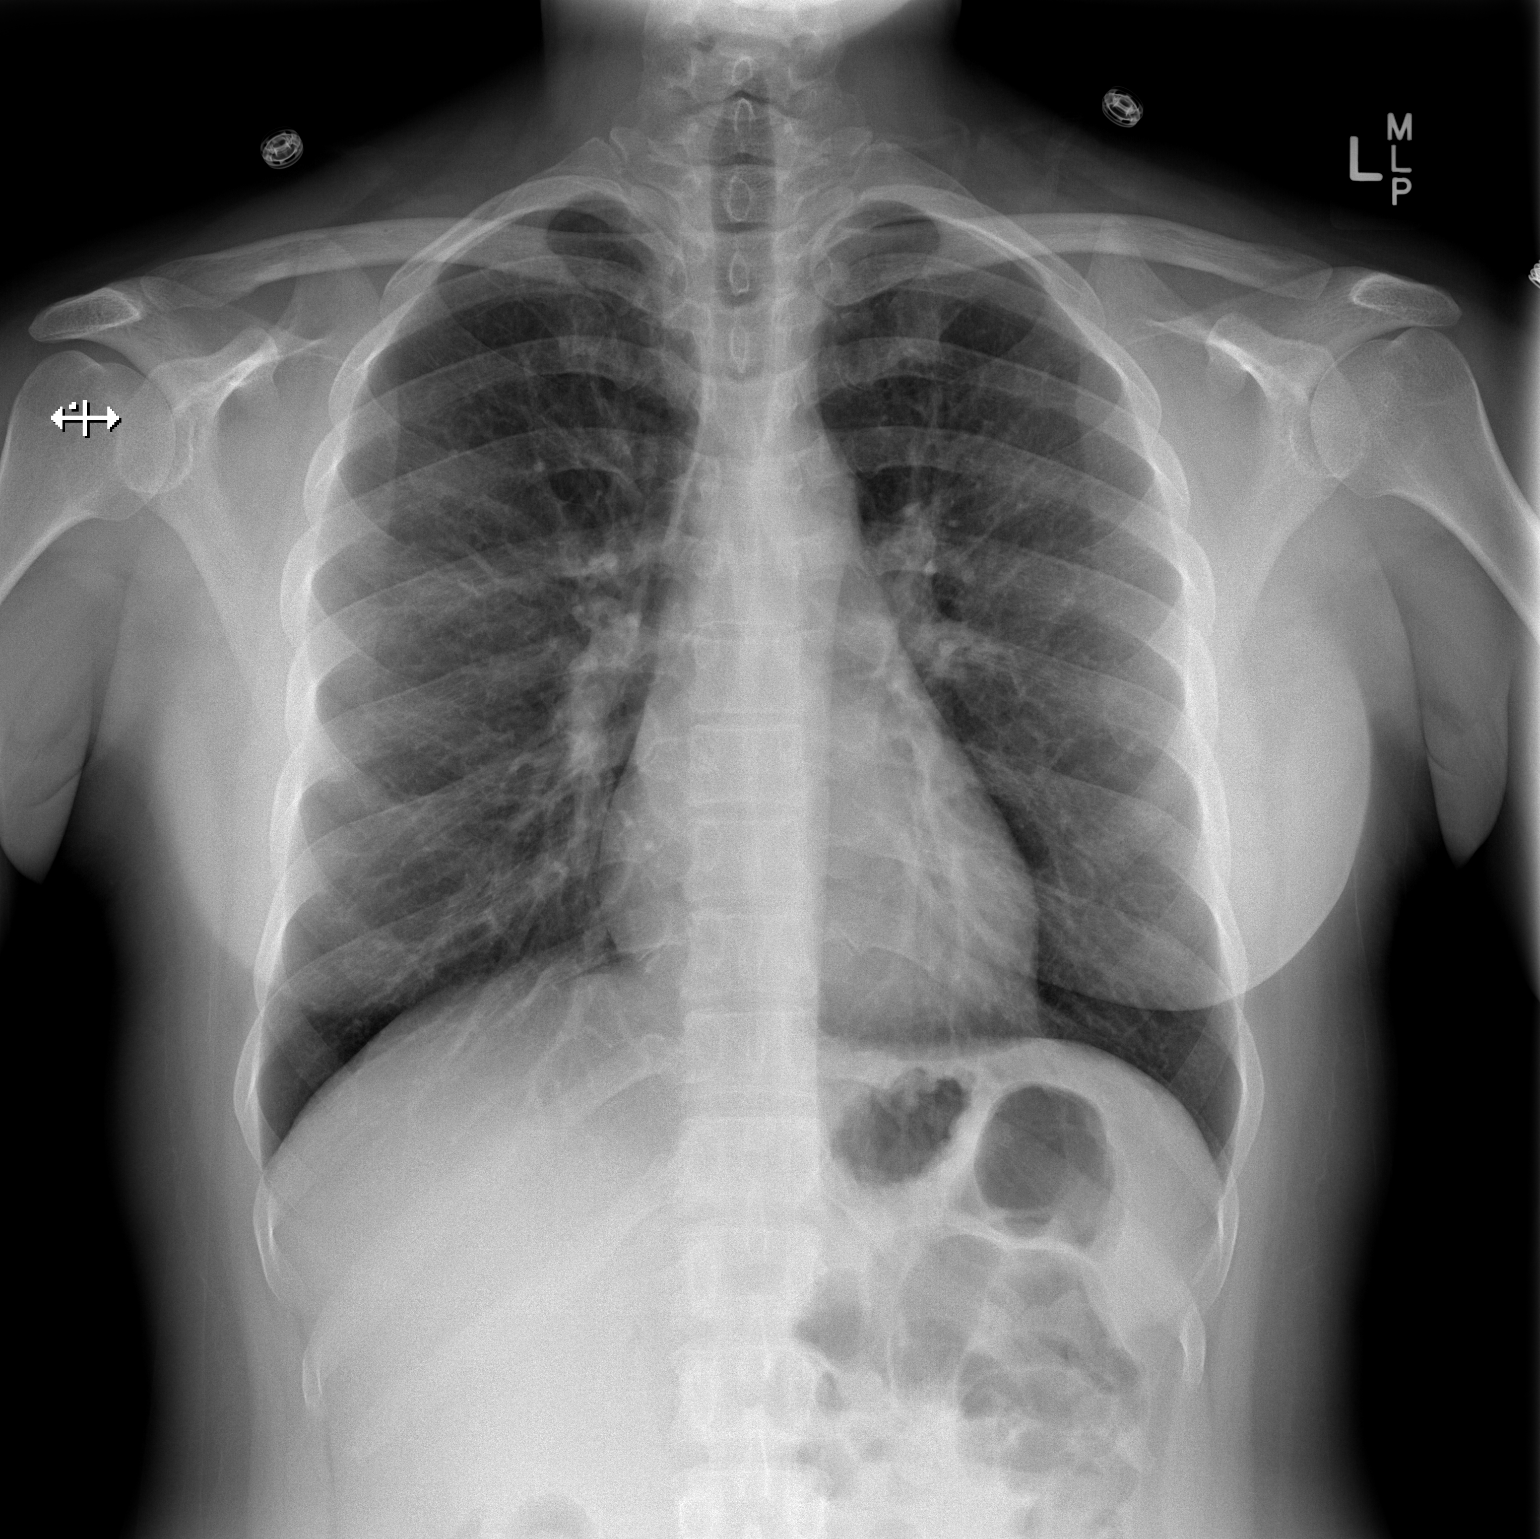

[w chest lat]
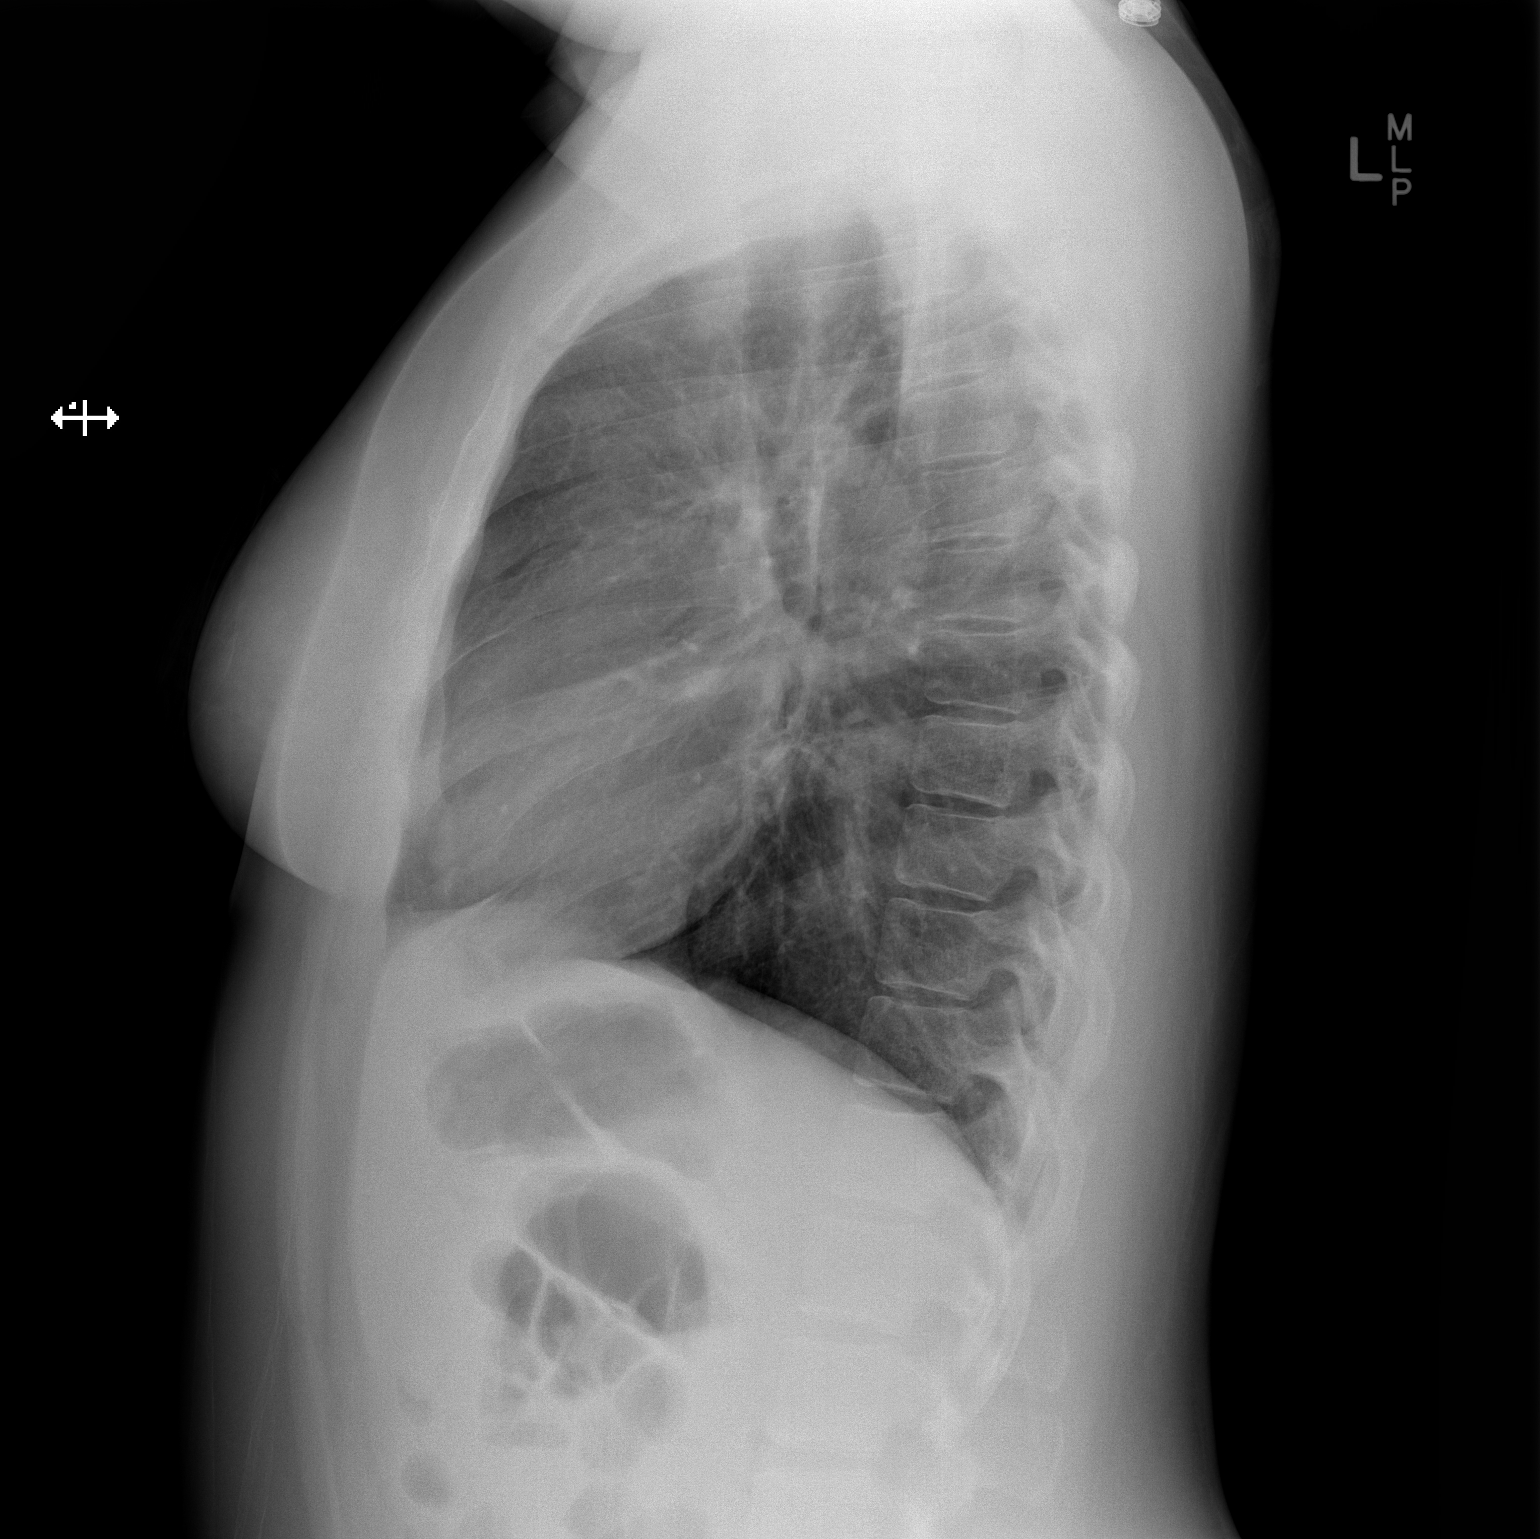

[2 of 2 positions shown; findings below may reference images not displayed]

FINDINGS: Normal heart size. Lungs clear. No pneumothorax. No pleural
effusion.
IMPRESSION: No active cardiopulmonary disease.

## 2016-04-28 MED FILL — $VENTOLIN HFA 18G INHALER: 108 (90 BAS | 28 days supply | Qty: 18 | Fill #1

## 2016-04-28 MED FILL — DULERA 200 MCG/5 MCG INH: 200-5 | 20 days supply | Qty: 13 | Fill #1

## 2016-05-20 ENCOUNTER — Other Ambulatory Visit: Payer: Self-pay | Admitting: Physician Assistant

## 2016-05-20 DIAGNOSIS — J4540 Moderate persistent asthma, uncomplicated: Secondary | ICD-10-CM

## 2016-05-20 MED ORDER — FLUTICASONE FUROATE-VILANTEROL 200-25 MCG/INH IN AEPB: 1.0000 | INHALATION_SPRAY | Freq: Every day | RESPIRATORY_TRACT | 2 refills | Status: DC

## 2016-05-20 MED FILL — **BREO ELLIPTA 200-25 MCG I: 200-25 MCG | 14 days supply | Qty: 28 | Fill #0

## 2016-05-20 MED FILL — $VENTOLIN HFA 18G INHALER: 108 (90 BAS | 25 days supply | Qty: 18 | Fill #1

## 2016-05-27 ENCOUNTER — Ambulatory Visit: Payer: Self-pay | Attending: Internal Medicine

## 2016-05-30 ENCOUNTER — Other Ambulatory Visit: Payer: Self-pay

## 2016-05-30 DIAGNOSIS — J4540 Moderate persistent asthma, uncomplicated: Secondary | ICD-10-CM

## 2016-05-30 MED ORDER — ALBUTEROL SULFATE HFA 108 (90 BASE) MCG/ACT IN AERS: INHALATION_SPRAY | RESPIRATORY_TRACT | 3 refills | Status: DC

## 2016-05-30 MED ORDER — FLUTICASONE FUROATE-VILANTEROL 200-25 MCG/INH IN AEPB: 1.0000 | INHALATION_SPRAY | Freq: Every day | RESPIRATORY_TRACT | 3 refills | Status: DC

## 2016-06-08 ENCOUNTER — Ambulatory Visit: Payer: Self-pay | Attending: Family Medicine | Admitting: Family Medicine

## 2016-06-08 VITALS — BP 105/73 | HR 73 | Temp 99.4°F | Resp 24 | Wt 170.6 lb

## 2016-06-08 DIAGNOSIS — J4540 Moderate persistent asthma, uncomplicated: Secondary | ICD-10-CM

## 2016-06-08 DIAGNOSIS — J069 Acute upper respiratory infection, unspecified: Secondary | ICD-10-CM | POA: Insufficient documentation

## 2016-06-08 DIAGNOSIS — Z79899 Other long term (current) drug therapy: Secondary | ICD-10-CM | POA: Insufficient documentation

## 2016-06-08 DIAGNOSIS — J3089 Other allergic rhinitis: Secondary | ICD-10-CM

## 2016-06-08 DIAGNOSIS — J45901 Unspecified asthma with (acute) exacerbation: Secondary | ICD-10-CM | POA: Insufficient documentation

## 2016-06-08 MED ORDER — FLUTICASONE PROPIONATE 50 MCG/ACT NA SUSP: 2.0000 | Freq: Every day | NASAL | 6 refills | Status: DC

## 2016-06-08 MED ORDER — FLUTICASONE-SALMETEROL 250-50 MCG/DOSE IN AEPB
1.0000 | INHALATION_SPRAY | Freq: Two times a day (BID) | RESPIRATORY_TRACT | 2 refills | Status: DC
Start: 2016-06-08 — End: 2016-08-03

## 2016-06-08 MED ORDER — IPRATROPIUM-ALBUTEROL 0.5-2.5 (3) MG/3ML IN SOLN: 3.0000 mL | Freq: Once | RESPIRATORY_TRACT | Status: DC

## 2016-06-08 MED ORDER — PREDNISONE 10 MG (21) PO TBPK: ORAL_TABLET | ORAL | 0 refills | Status: DC

## 2016-06-08 MED ORDER — AZITHROMYCIN 250 MG PO TABS: 250.0000 mg | ORAL_TABLET | Freq: Every day | ORAL | 0 refills | Status: DC

## 2016-06-08 MED ORDER — GUAIFENESIN ER 600 MG PO TB12: 600.0000 mg | ORAL_TABLET | Freq: Two times a day (BID) | ORAL | Status: DC

## 2016-06-08 MED ORDER — ALBUTEROL SULFATE HFA 108 (90 BASE) MCG/ACT IN AERS: INHALATION_SPRAY | RESPIRATORY_TRACT | 2 refills | Status: DC

## 2016-06-08 MED ORDER — OSELTAMIVIR PHOSPHATE 75 MG PO CAPS: 75.0000 mg | ORAL_CAPSULE | Freq: Two times a day (BID) | ORAL | 0 refills | Status: DC

## 2016-06-08 MED ORDER — MONTELUKAST SODIUM 10 MG PO TABS: 10.0000 mg | ORAL_TABLET | Freq: Every day | ORAL | 3 refills | Status: DC

## 2016-06-08 MED ORDER — IPRATROPIUM-ALBUTEROL 0.5-2.5 (3) MG/3ML IN SOLN
3.0000 mL | RESPIRATORY_TRACT | Status: DC | PRN
  Administered 2016-06-08: 3 mL via RESPIRATORY_TRACT

## 2016-06-08 MED FILL — AZITHROMYCIN 250 MG TABLET: 250 | 5 days supply | Qty: 6 | Fill #0

## 2016-06-08 MED FILL — MONTELUKAST SOD 10 MG TAB: 10 | 30 days supply | Qty: 30 | Fill #0

## 2016-06-08 MED FILL — $VENTOLIN HFA 18G INHALER: 108 (90 BAS | 25 days supply | Qty: 18 | Fill #0

## 2016-06-08 MED FILL — OSELTAMIVIR PHOS 75 MG CAP: 75 | 5 days supply | Qty: 10 | Fill #0

## 2016-06-08 MED FILL — ?PREDNISONE 10 MG TABLET: 10 | 7 days supply | Qty: 21 | Fill #0

## 2016-06-08 MED FILL — **ADVAIR 250/50 DISKUS: 250-50 MCG | 30 days supply | Qty: 60 | Fill #0

## 2016-06-08 MED FILL — FLUTICASONE PROP 50 MCG SPR: 50 | 30 days supply | Qty: 16 | Fill #0

## 2016-06-08 NOTE — Progress Notes (Signed)
Subjective:  Patient ID: Dawn Robertson, female    DOB: 07/20/1985  Age: 31 y.o. MRN: [id number]  CC: Asthma   HPI Dawn Robertson presents for   URI symptoms: Productive cough for last 2 weeks with yellow to white color sputum. Reports symptoms of hoarseness, feeling feverish, and sweating that began today. Denies being around anyone with similar symptoms. Reports taking "all natural" OTC pills for cough with no relief of symptoms.   Asthma: Reports being without ICS inhalers for a month. Reports moderate relief of symptoms on Dulera  better relief on trial sample of Breo.   Outpatient Medications Prior to Visit  Medication Sig Dispense Refill  . ipratropium-albuterol (DUONEB) 0.5-2.5 (3) MG/3ML SOLN Take 3 mLs by nebulization every 4 (four) hours as needed. For sob. 360 mL 2  . mometasone-formoterol (DULERA) 200-5 MCG/ACT AERO Inhale 2 puffs into the lungs 2 (two) times daily. 1 Inhaler 2  . albuterol (VENTOLIN HFA) 108 (90 Base) MCG/ACT inhaler INHALE 2 PUFFS INTO THE LUNGS EVERY 6 HOURS AS NEEDED FOR WHEEZING OR SHORTNESS OF BREATH. 54 g 3  . montelukast (SINGULAIR) 10 MG tablet Take 1 tablet (10 mg total) by mouth at bedtime. 30 tablet 3  . fexofenadine (ALLEGRA ODT) 30 MG disintegrating tablet Take 1 tablet (30 mg total) by mouth daily. (Patient not taking: Reported on 06/08/2016) 90 tablet 2  . fluticasone furoate-vilanterol (BREO ELLIPTA) 200-25 MCG/INH AEPB Inhale 1 puff into the lungs daily. (Patient not taking: Reported on 06/08/2016) 180 each 3   Facility-Administered Medications Prior to Visit  Medication Dose Route Frequency Provider Last Rate Last Dose  . ipratropium-albuterol (DUONEB) 0.5-2.5 (3) MG/3ML nebulizer solution 3 mL  3 mL Nebulization Q6H Dawn T Julien NordmannLangeland, MD        ROS Review of Systems  Constitutional: Positive for diaphoresis and fatigue.       Reports feeling "feverish".  HENT: Positive for rhinorrhea and voice change (hoarseness).   Eyes:  Negative.   Respiratory: Positive for cough, shortness of breath and wheezing.   Cardiovascular: Negative.   Skin: Negative.    Objective:  BP 105/73 (BP Location: Right Arm, Patient Position: Sitting, Cuff Size: Normal)   Pulse 73   Temp 99.4 F (37.4 C) (Oral)   Resp (!) 24   Wt 170 lb 9.6 oz (77.4 kg)   LMP 04/28/2016 (Approximate)   SpO2 98%   BMI 31.20 kg/m   BP/Weight 06/08/2016 03/23/2016 02/26/2016  Systolic BP 105 134 112  Diastolic BP 73 82 76  Wt. (Lbs) 170.6 173 176.4  BMI 31.2 31.64 32.26   Physical Exam  Constitutional: She appears ill.  HENT:  Head: Normocephalic.  Right Ear: External ear normal.  Left Ear: External ear normal.  Nose: Mucosal edema (inflammed nasal turbinates) present.  Mouth/Throat: Mucous membranes are normal. Posterior oropharyngeal erythema present.  Eyes: Conjunctivae are normal.  Neck: Normal range of motion. No JVD present.  Cardiovascular: Normal rate, regular rhythm, normal heart sounds and intact distal pulses.   Pulmonary/Chest: Effort normal. She has wheezes.  cough  Abdominal: Soft. Bowel sounds are normal.  Skin: Skin is warm and dry.  Nursing note and vitals reviewed.  Assessment & Plan:   Problem List Items Addressed This Visit      Respiratory   Asthma with exacerbation - Primary   -Breathing treatments given in office.    -Patient given strict emergency department precautions if symptoms worsen or do not improve.    Relevant Medications  Fluticasone-Salmeterol (ADVAIR DISKUS) 250-50 MCG/DOSE AEPB   albuterol (VENTOLIN HFA) 108 (90 Base) MCG/ACT inhaler   montelukast (SINGULAIR) 10 MG tablet   ipratropium-albuterol (DUONEB) 0.5-2.5 (3) MG/3ML nebulizer solution 3 mL   predniSONE (STERAPRED UNI-PAK 21 TAB) 10 MG (21) TBPK tablet   guaiFENesin (MUCINEX) 600 MG 12 hr tablet   ipratropium-albuterol (DUONEB) 0.5-2.5 (3) MG/3ML nebulizer solution 3 mL   Asthma   Relevant Medications   Fluticasone-Salmeterol (ADVAIR  DISKUS) 250-50 MCG/DOSE AEPB   albuterol (VENTOLIN HFA) 108 (90 Base) MCG/ACT inhaler   montelukast (SINGULAIR) 10 MG tablet   ipratropium-albuterol (DUONEB) 0.5-2.5 (3) MG/3ML nebulizer solution 3 mL   predniSONE (STERAPRED UNI-PAK 21 TAB) 10 MG (21) TBPK tablet   ipratropium-albuterol (DUONEB) 0.5-2.5 (3) MG/3ML nebulizer solution 3 mL    Other Visit Diagnoses    Upper respiratory tract infection, unspecified type       Relevant Medications   oseltamivir (TAMIFLU) 75 MG capsule   guaiFENesin (MUCINEX) 600 MG 12 hr tablet   azithromycin (ZITHROMAX) 250 MG tablet   Other Relevant Orders   Respiratory virus panel   Chronic non-seasonal allergic rhinitis, unspecified trigger       Relevant Medications   montelukast (SINGULAIR) 10 MG tablet   fluticasone (FLONASE) 50 MCG/ACT nasal spray     Meds ordered this encounter  Medications  . Fluticasone-Salmeterol (ADVAIR DISKUS) 250-50 MCG/DOSE AEPB    Sig: Inhale 1 puff into the lungs 2 (two) times daily.    Dispense:  60 each    Refill:  2    Order Specific Question:   Supervising Provider    Answer:   Quentin Angst [id number]  . albuterol (VENTOLIN HFA) 108 (90 Base) MCG/ACT inhaler    Sig: INHALE 2 PUFFS INTO THE LUNGS EVERY 6 HOURS AS NEEDED FOR WHEEZING OR SHORTNESS OF BREATH.    Dispense:  54 g    Refill:  2    Order Specific Question:   Supervising Provider    Answer:   Quentin Angst [id number]  . montelukast (SINGULAIR) 10 MG tablet    Sig: Take 1 tablet (10 mg total) by mouth at bedtime.    Dispense:  30 tablet    Refill:  3    Order Specific Question:   Supervising Provider    Answer:   Quentin Angst [id number]  . fluticasone (FLONASE) 50 MCG/ACT nasal spray    Sig: Place 2 sprays into both nostrils daily.    Dispense:  16 g    Refill:  6    Order Specific Question:   Supervising Provider    Answer:   Quentin Angst [id number]  . ipratropium-albuterol (DUONEB) 0.5-2.5 (3) MG/3ML nebulizer  solution 3 mL  . predniSONE (STERAPRED UNI-PAK 21 TAB) 10 MG (21) TBPK tablet    Sig: Day 1: 6 tablets by mouth, Day 2: 5 tablets,  Day 3: 4 tablets, Day 4: 3 tablets, Day 5: 2 tablets, Day 6: 1 tablets.    Dispense:  21 tablet    Refill:  0    Order Specific Question:   Supervising Provider    Answer:   Quentin Angst [id number]  . oseltamivir (TAMIFLU) 75 MG capsule    Sig: Take 1 capsule (75 mg total) by mouth 2 (two) times daily.    Dispense:  10 capsule    Refill:  0    Order Specific Question:   Supervising Provider    Answer:   Quentin Angst [id number]  .  guaiFENesin (MUCINEX) 600 MG 12 hr tablet    Sig: Take 1 tablet (600 mg total) by mouth 2 (two) times daily. For 7 days.    Order Specific Question:   Supervising Provider    Answer:   Quentin Angst [id number]  . azithromycin (ZITHROMAX) 250 MG tablet    Sig: Take 1 tablet (250 mg total) by mouth daily. Take 2 tablets (500 mg total) by mouth day one. Then 1 tablet daily.    Dispense:  6 tablet    Refill:  0    Order Specific Question:   Supervising Provider    Answer:   Quentin Angst [id number]  . ipratropium-albuterol (DUONEB) 0.5-2.5 (3) MG/3ML nebulizer solution 3 mL    Follow-up: If symptoms worsen or fail to improve go to the Emergency Department.   Lizbeth Bark FNP

## 2016-06-08 NOTE — Patient Instructions (Addendum)
If symptoms worsen or fail to improve go to the Emergency Department.  Asthma, Adult Asthma is a recurring condition in which the airways tighten and narrow. Asthma can make it difficult to breathe. It can cause coughing, wheezing, and shortness of breath. Asthma episodes, also called asthma attacks, range from minor to life-threatening. Asthma cannot be cured, but medicines and lifestyle changes can help control it. What are the causes? Asthma is believed to be caused by inherited (genetic) and environmental factors, but its exact cause is unknown. Asthma may be triggered by allergens, lung infections, or irritants in the air. Asthma triggers are different for each person. Common triggers include:  Animal dander.  Dust mites.  Cockroaches.  Pollen from trees or grass.  Mold.  Smoke.  Air pollutants such as dust, household cleaners, hair sprays, aerosol sprays, paint fumes, strong chemicals, or strong odors.  Cold air, weather changes, and winds (which increase molds and pollens in the air).  Strong emotional expressions such as crying or laughing hard.  Stress.  Certain medicines (such as aspirin) or types of drugs (such as beta-blockers).  Sulfites in foods and drinks. Foods and drinks that may contain sulfites include dried fruit, potato chips, and sparkling grape juice.  Infections or inflammatory conditions such as the flu, a cold, or an inflammation of the nasal membranes (rhinitis).  Gastroesophageal reflux disease (GERD).  Exercise or strenuous activity. What are the signs or symptoms? Symptoms may occur immediately after asthma is triggered or many hours later. Symptoms include:  Wheezing.  Excessive nighttime or early morning coughing.  Frequent or severe coughing with a common cold.  Chest tightness.  Shortness of breath. How is this diagnosed? The diagnosis of asthma is made by a review of your medical history and a physical exam. Tests may also be  performed. These may include:  Lung function studies. These tests show how much air you breathe in and out.  Allergy tests.  Imaging tests such as X-rays. How is this treated? Asthma cannot be cured, but it can usually be controlled. Treatment involves identifying and avoiding your asthma triggers. It also involves medicines. There are 2 classes of medicine used for asthma treatment:  Controller medicines. These prevent asthma symptoms from occurring. They are usually taken every day.  Reliever or rescue medicines. These quickly relieve asthma symptoms. They are used as needed and provide short-term relief. Your health care provider will help you create an asthma action plan. An asthma action plan is a written plan for managing and treating your asthma attacks. It includes a list of your asthma triggers and how they may be avoided. It also includes information on when medicines should be taken and when their dosage should be changed. An action plan may also involve the use of a device called a peak flow meter. A peak flow meter measures how well the lungs are working. It helps you monitor your condition. Follow these instructions at home:  Take medicines only as directed by your health care provider. Speak with your health care provider if you have questions about how or when to take the medicines.  Use a peak flow meter as directed by your health care provider. Record and keep track of readings.  Understand and use the action plan to help minimize or stop an asthma attack without needing to seek medical care.  Control your home environment in the following ways to help prevent asthma attacks:  Do not smoke. Avoid being exposed to secondhand smoke.  Change  your heating and air conditioning filter regularly.  Limit your use of fireplaces and wood stoves.  Get rid of pests (such as roaches and mice) and their droppings.  Throw away plants if you see mold on them.  Clean your floors  and dust regularly. Use unscented cleaning products.  Try to have someone else vacuum for you regularly. Stay out of rooms while they are being vacuumed and for a short while afterward. If you vacuum, use a dust mask from a hardware store, a double-layered or microfilter vacuum cleaner bag, or a vacuum cleaner with a HEPA filter.  Replace carpet with wood, tile, or vinyl flooring. Carpet can trap dander and dust.  Use allergy-proof pillows, mattress covers, and box spring covers.  Wash bed sheets and blankets every week in hot water and dry them in a dryer.  Use blankets that are made of polyester or cotton.  Clean bathrooms and kitchens with bleach. If possible, have someone repaint the walls in these rooms with mold-resistant paint. Keep out of the rooms that are being cleaned and painted.  Wash hands frequently. Contact a health care provider if:  You have wheezing, shortness of breath, or a cough even if taking medicine to prevent attacks.  The colored mucus you cough up (sputum) is thicker than usual.  Your sputum changes from clear or white to yellow, green, gray, or bloody.  You have any problems that may be related to the medicines you are taking (such as a rash, itching, swelling, or trouble breathing).  You are using a reliever medicine more than 2-3 times per week.  Your peak flow is still at 50-79% of your personal best after following your action plan for 1 hour.  You have a fever. Get help right away if:  You seem to be getting worse and are unresponsive to treatment during an asthma attack.  You are short of breath even at rest.  You get short of breath when doing very little physical activity.  You have difficulty eating, drinking, or talking due to asthma symptoms.  You develop chest pain.  You develop a fast heartbeat.  You have a bluish color to your lips or fingernails.  You are light-headed, dizzy, or faint.  Your peak flow is less than 50% of your  personal best. This information is not intended to replace advice given to you by your health care provider. Make sure you discuss any questions you have with your health care provider. Document Released: 03/14/2005 Document Revised: 08/26/2015 Document Reviewed: 10/11/2012 Elsevier Interactive Patient Education  2017 [employer]

## 2016-06-09 ENCOUNTER — Ambulatory Visit: Payer: Self-pay | Admitting: Family Medicine

## 2016-06-09 LAB — RESPIRATORY VIRUS PANEL
Adenovirus B: NOT DETECTED
INFLUENZA A: NOT DETECTED
INFLUENZA B 1: NOT DETECTED
Influenza A H1: NOT DETECTED
Influenza A H3: NOT DETECTED
Metapneumovirus: NOT DETECTED
PARAINFLUENZA 2 A: NOT DETECTED
PARAINFLUENZA 3 A: NOT DETECTED
Parainfluenza 1: NOT DETECTED
RESPIRATORY SYNCYTIAL VIRUS B: NOT DETECTED
Respiratory Syncytial Virus A: NOT DETECTED
Rhinovirus: DETECTED — AB

## 2016-06-14 ENCOUNTER — Telehealth: Payer: Self-pay

## 2016-06-14 NOTE — Telephone Encounter (Signed)
-----   Message from Lizbeth BarkMandesia R Hairston, FNP sent at 06/14/2016  4:43 PM EDT ----- Respiratory virus panel was negative for flu but positive rhinovirus which is a common cold virus.  Follow up if symptoms do no improve.

## 2016-06-14 NOTE — Telephone Encounter (Signed)
CMA call to inform patient about results  Patient answer but phone cut off call 2 time   Patient Verify DOB  Patient was aware and understood

## 2016-06-17 ENCOUNTER — Other Ambulatory Visit: Payer: Self-pay | Admitting: Pharmacist

## 2016-07-06 MED FILL — $VENTOLIN HFA 18G INHALER: 108 (90 BAS | 25 days supply | Qty: 18 | Fill #1

## 2016-07-25 MED FILL — $VENTOLIN HFA 18G INHALER: 108 (90 BAS | 25 days supply | Qty: 18 | Fill #2

## 2016-07-27 MED FILL — !ADVAIR 250/50 DISKUS: 250-50 | 30 days supply | Qty: 60 | Fill #2

## 2016-07-27 MED FILL — **ADVAIR 250/50 DISKUS: 250-50 MCG | 14 days supply | Qty: 60 | Fill #1

## 2016-08-03 ENCOUNTER — Other Ambulatory Visit: Payer: Self-pay

## 2016-08-03 DIAGNOSIS — J45901 Unspecified asthma with (acute) exacerbation: Secondary | ICD-10-CM

## 2016-08-03 DIAGNOSIS — J4540 Moderate persistent asthma, uncomplicated: Secondary | ICD-10-CM

## 2016-08-03 MED ORDER — FLUTICASONE-SALMETEROL 250-50 MCG/DOSE IN AEPB: 1.0000 | INHALATION_SPRAY | Freq: Two times a day (BID) | RESPIRATORY_TRACT | 3 refills | Status: DC

## 2016-08-08 ENCOUNTER — Encounter: Payer: Self-pay | Admitting: Internal Medicine

## 2016-08-08 ENCOUNTER — Ambulatory Visit: Payer: Self-pay | Attending: Internal Medicine | Admitting: Internal Medicine

## 2016-08-08 VITALS — BP 132/85 | HR 91 | Temp 99.1°F | Resp 16 | Wt 175.0 lb

## 2016-08-08 DIAGNOSIS — Z7951 Long term (current) use of inhaled steroids: Secondary | ICD-10-CM | POA: Insufficient documentation

## 2016-08-08 DIAGNOSIS — R05 Cough: Secondary | ICD-10-CM | POA: Insufficient documentation

## 2016-08-08 DIAGNOSIS — J069 Acute upper respiratory infection, unspecified: Secondary | ICD-10-CM | POA: Insufficient documentation

## 2016-08-08 DIAGNOSIS — J4541 Moderate persistent asthma with (acute) exacerbation: Secondary | ICD-10-CM | POA: Insufficient documentation

## 2016-08-08 DIAGNOSIS — J029 Acute pharyngitis, unspecified: Secondary | ICD-10-CM

## 2016-08-08 DIAGNOSIS — Z720 Tobacco use: Secondary | ICD-10-CM | POA: Insufficient documentation

## 2016-08-08 DIAGNOSIS — J45901 Unspecified asthma with (acute) exacerbation: Secondary | ICD-10-CM

## 2016-08-08 MED ORDER — IPRATROPIUM-ALBUTEROL 0.5-2.5 (3) MG/3ML IN SOLN: 3.0000 mL | RESPIRATORY_TRACT | 2 refills | Status: DC | PRN

## 2016-08-08 MED ORDER — FLUTICASONE-SALMETEROL 250-50 MCG/DOSE IN AEPB: 1.0000 | INHALATION_SPRAY | Freq: Two times a day (BID) | RESPIRATORY_TRACT | 3 refills | Status: DC

## 2016-08-08 MED ORDER — AMOXICILLIN-POT CLAVULANATE 875-125 MG PO TABS: 1.0000 | ORAL_TABLET | Freq: Two times a day (BID) | ORAL | 0 refills | Status: DC

## 2016-08-08 MED ORDER — IPRATROPIUM-ALBUTEROL 0.5-2.5 (3) MG/3ML IN SOLN: 3.0000 mL | Freq: Once | RESPIRATORY_TRACT | Status: DC

## 2016-08-08 MED ORDER — PREDNISONE 20 MG PO TABS: 60.0000 mg | ORAL_TABLET | Freq: Every day | ORAL | 0 refills | Status: DC

## 2016-08-08 MED ORDER — MONTELUKAST SODIUM 10 MG PO TABS: 10.0000 mg | ORAL_TABLET | Freq: Every day | ORAL | 3 refills | Status: DC

## 2016-08-08 MED ORDER — FEXOFENADINE HCL 30 MG PO TBDP: 30.0000 mg | ORAL_TABLET | Freq: Every day | ORAL | 2 refills | Status: DC

## 2016-08-08 MED ORDER — METHYLPREDNISOLONE SODIUM SUCC 125 MG IJ SOLR
125.0000 mg | Freq: Once | INTRAMUSCULAR | Status: AC
  Administered 2016-08-08: 125 mg via INTRAMUSCULAR

## 2016-08-08 MED ORDER — FLUTICASONE PROPIONATE 50 MCG/ACT NA SUSP: 2.0000 | Freq: Every day | NASAL | 6 refills | Status: DC

## 2016-08-08 MED FILL — FLUTICASONE PROP 50 MCG SPR: 50 | 30 days supply | Qty: 16 | Fill #0

## 2016-08-08 MED FILL — predniSONE 20 MG TABS: 20 | 10 days supply | Qty: 20 | Fill #0

## 2016-08-08 MED FILL — $VENTOLIN HFA 18G INHALER: 108 (90 BAS | 25 days supply | Qty: 18 | Fill #3

## 2016-08-08 MED FILL — MONTELUKAST SOD 10 MG TAB: 10 | 30 days supply | Qty: 30 | Fill #0

## 2016-08-08 MED FILL — AMOX-CLAV 875-125 MG TABLET: 875-125 | 7 days supply | Qty: 14 | Fill #0

## 2016-08-08 MED FILL — IPRAT-ALBUT 0.5-3(2.5) MG/3: 0.5-2.5 (3) | 20 days supply | Qty: 360 | Fill #0

## 2016-08-08 NOTE — Progress Notes (Signed)
Peak Flow 160/170

## 2016-08-08 NOTE — Progress Notes (Signed)
Dawn Robertson, is a 31 y.o. female  RUE:[id number]  JYN:[id number]  DOB - 03/25/1986  Chief Complaint  Patient presents with  . URI        Subjective:   Dawn Robertson is a 31 y.o. female here today for a cough and uri sx. Pt w/ hx of asthma, hx of tob (but has quit for several months now). Was in [place] recently, but no other recent travels. 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  She noted sore throat since Friday, than lost her voice - which is slowly coming back, sub fevers, +chills, productive cough. Has been using her duonebs more at home, as well as increase use of her rescue inhaler last few days.  Taking all meds, except her allegra.  Patient has No headache, No chest pain, No abdominal pain - No Nausea, No new weakness tingling or numbness, No Cough - SOB.  No problems updated.  ALLERGIES: No Known Allergies  PAST MEDICAL HISTORY: Past Medical History:  Diagnosis Date  . Asthma    seasonal  . Seasonal allergies   . Shortness of breath   . Tobacco abuse     MEDICATIONS AT HOME: Prior to Admission medications   Medication Sig Start Date End Date Taking? Authorizing Provider  albuterol (VENTOLIN HFA) 108 (90 Base) MCG/ACT inhaler INHALE 2 PUFFS INTO THE LUNGS EVERY 6 HOURS AS NEEDED FOR WHEEZING OR SHORTNESS OF BREATH. 06/08/16   Lizbeth BarkHairston, Mandesia R, FNP  amoxicillin-clavulanate (AUGMENTIN) 875-125 MG tablet Take 1 tablet by mouth 2 (two) times daily. 08/08/16   Crystian Frith, Kathaleen Grinderawn T, MD  azithromycin (ZITHROMAX) 250 MG tablet Take 1 tablet (250 mg total) by mouth daily. Take 2 tablets (500 mg total) by mouth day one. Then 1 tablet daily. Patient not taking: Reported on 08/08/2016 06/08/16   Lizbeth BarkHairston, Mandesia R, FNP  fexofenadine (ALLEGRA ODT) 30 MG disintegrating tablet Take 1 tablet (30 mg total) by mouth daily. 08/08/16   Maliek Schellhorn, Kathaleen Grinderawn T, MD  fluticasone (FLONASE) 50 MCG/ACT nasal spray Place 2 sprays into both nostrils daily. 08/08/16    Aireonna Bauer T, MD  Fluticasone-Salmeterol (ADVAIR DISKUS) 250-50 MCG/DOSE AEPB Inhale 1 puff into the lungs 2 (two) times daily. 08/08/16   Pete GlatterLangeland, Haiven Nardone T, MD  guaiFENesin (MUCINEX) 600 MG 12 hr tablet Take 1 tablet (600 mg total) by mouth 2 (two) times daily. For 7 days. Patient not taking: Reported on 08/08/2016 06/08/16   Lizbeth BarkHairston, Mandesia R, FNP  ipratropium-albuterol (DUONEB) 0.5-2.5 (3) MG/3ML SOLN Take 3 mLs by nebulization every 4 (four) hours as needed. For sob. 08/08/16   Krimson Massmann T, MD  montelukast (SINGULAIR) 10 MG tablet Take 1 tablet (10 mg total) by mouth at bedtime. 08/08/16   Pete GlatterLangeland, Falan Hensler T, MD  oseltamivir (TAMIFLU) 75 MG capsule Take 1 capsule (75 mg total) by mouth 2 (two) times daily. Patient not taking: Reported on 08/08/2016 06/08/16   Lizbeth BarkHairston, Mandesia R, FNP  predniSONE (STERAPRED UNI-PAK 21 TAB) 10 MG (21) TBPK tablet Day 1: 6 tablets by mouth, Day 2: 5 tablets,  Day 3: 4 tablets, Day 4: 3 tablets, Day 5: 2 tablets, Day 6: 1 tablets. Patient not taking: Reported on 08/08/2016 06/08/16   Lizbeth BarkHairston, Mandesia R, FNP     Objective:   Vitals:   08/08/16 0921  BP: 132/85  Pulse: 91  Resp: 16  Temp: 99.1 F (37.3 C)  TempSrc: Oral  SpO2: 98%  Weight: 175 lb (79.4 kg)    Exam General appearance :  Awake, alert, not in any distress. Speech Clear. Not toxic looking, +horse voice HEENT: Atraumatic and Normocephalic, pupils equally reactive to light.  bilat TMs clear.  Boggy naires bilat.  Enlarged tonsils bilat, but no exudate.  Neck: supple, no JVD. No cervical lymphadenopathy.  Chest (before nebs): diminished. Course breath sounds, diffuse tightness and exp wheezing/rhonhi Post nebs:   Much better aeration.  Less exp wheezing and tightness throughout. CVS: S1 S2 regular, no murmurs/gallups or rubs. Abdomen: Bowel sounds active, Non tender  Extremities: B/L Lower Ext shows no edema, both legs are warm to touch Neurology: Awake alert, and oriented X 3, CN  II-XII grossly intact, Non focal Skin:No Rash  Data Review Lab Results  Component Value Date   HGBA1C 4 5 06/08/2015    Depression screen [facility] 2/9 08/08/2016 03/23/2016 02/26/2016 11/24/2015 11/11/2015  Decreased Interest 3 3 3 3 3   Down, Depressed, Hopeless 3 3 3 1 3   PHQ - 2 Score 6 6 6 4 6   Altered sleeping 3 3 3 3 3   Tired, decreased energy 3 3 3 3 3   Change in appetite 3 3 3 3 3   Feeling bad or failure about yourself  3 3 3  0 3  Trouble concentrating 1 3 3 3  0  Moving slowly or fidgety/restless 0 0 0 1 0  Suicidal thoughts 0 1 1 0 0  PHQ-9 Score 19 22 22 17 18   Difficult doing work/chores - - - - -      Assessment & Plan   1. Moderate persistent asthma with exacerbation Peak flows prior to nebs/ 170 and 160 Post nebs PF:  225 and 230. - ipratropium-albuterol (DUONEB) 0.5-2.5 (3) MG/3ML nebulizer solution 3 mL; Take 3 mLs by nebulization once. - methylPREDNISolone sodium succinate (SOLU-MEDROL) 125 mg/2 mL injection 125 mg; Inject 2 mLs (125 mg total) into the muscle once. - Spirometry: Peak - montelukast (SINGULAIR) 10 MG tablet; Take 1 tablet (10 mg total) by mouth at bedtime.  Dispense: 30 tablet; Refill: 3 - Fluticasone-Salmeterol (ADVAIR DISKUS) 250-50 MCG/DOSE AEPB; Inhale 1 puff into the lungs 2 (two) times daily.  Dispense: 180 each; Refill: 3 - encouraged allegra as well - encouraged continued tob cessation - work note written today. - steroid taper and rx augmentin x 7 days bid.  Patient have been counseled extensively about nutrition and exercise  Return in about 2 weeks (around 08/22/2016) for asthma exacerbation.  The patient was given clear instructions to go to ER or return to medical center if symptoms don't improve, worsen or new problems develop. The patient verbalized understanding. The patient was told to call to get lab results if they haven't heard anything in the next week.   This note has been created with Print production planner. Any transcriptional errors are unintentional.   Pete Glatter, MD, MBA/MHA [facility] and [facility] [place], [place] [phone]   08/08/2016, 9:36 AM

## 2016-08-08 NOTE — Patient Instructions (Signed)
Bronchospasm, Adult Bronchospasm is when airways in the lungs get smaller. When this happens, it can be hard to breathe. You may cough. You may also make a whistling sound when you breathe (wheeze). Follow these instructions at home: Medicines   Take over-the-counter and prescription medicines only as told by your doctor.  If you need to use an inhaler or nebulizer to take your medicine, ask your doctor how to use it.  If you were given a spacer, always use it with your inhaler. Lifestyle   Change your heating and air conditioning filter. Do this at least once a month.  Try not to use fireplaces and wood stoves.  Do not  smoke. Do not  allow smoking in your home.  Try not to use things that have a strong smell, like perfume.  Get rid of pests (such as roaches and mice) and their poop.  Remove any mold from your home.  Keep your house clean. Get rid of dust.  Use cleaning products that have no smell.  Replace carpet with wood, tile, or vinyl flooring.  Use allergy-proof pillows, mattress covers, and box spring covers.  Wash bed sheets and blankets every week. Use hot water. Dry them in a dryer.  Use blankets that are made of polyester or cotton.  Wash your hands often.  Keep pets out of your bedroom.  When you exercise, try not to breathe in cold air. General instructions   Have a plan for getting medical care. Know these things:  When to call your doctor.  When to call local emergency services (911 in the [place]  Where to go in an emergency.  Stay up to date on your shots (immunizations).  When you have an episode:  Stay calm.  Relax.  Breathe slowly. Contact a doctor if:  Your muscles ache.  Your chest hurts.  The color of the mucus you cough up (sputum) changes from clear or white to yellow, green, gray, or bloody.  The mucus you cough up gets thicker.  You have a fever. Get help right away if:  The whistling sound gets worse, even after you  take your medicines.  Your coughing gets worse.  You find it even harder to breathe.  Your chest hurts very much. Summary  Bronchospasm is when airways in the lungs get smaller.  When this happens, it can be hard to breathe. You may cough. You may also make a whistling sound when you breathe.  Stay away from things that cause you to have episodes. These include smoke or dust. This information is not intended to replace advice given to you by your health care provider. Make sure you discuss any questions you have with your health care provider. Document Released: 01/09/2009 Document Revised: 03/17/2016 Document Reviewed: 03/17/2016 Elsevier Interactive Patient Education  2017 [employer] Inc.   -   Pharyngitis Pharyngitis is a sore throat (pharynx). There is redness, pain, and swelling of your throat. Follow these instructions at home:  Drink enough fluids to keep your pee (urine) clear or pale yellow.  Only take medicine as told by your doctor.  You may get sick again if you do not take medicine as told. Finish your medicines, even if you start to feel better.  Do not take aspirin.  Rest.  Rinse your mouth (gargle) with salt water ( tsp of salt per 1 qt of water) every 1-2 hours. This will help the pain.  If you are not at risk for choking, you can suck  on hard candy or sore throat lozenges. Contact a doctor if:  You have large, tender lumps on your neck.  You have a rash.  You cough up green, yellow-brown, or bloody spit. Get help right away if:  You have a stiff neck.  You drool or cannot swallow liquids.  You throw up (vomit) or are not able to keep medicine or liquids down.  You have very bad pain that does not go away with medicine.  You have problems breathing (not from a stuffy nose). This information is not intended to replace advice given to you by your health care provider. Make sure you discuss any questions you have with your health care  provider. Document Released: 08/31/2007 Document Revised: 08/20/2015 Document Reviewed: 11/19/2012 Elsevier Interactive Patient Education  2017 [employer]

## 2016-08-11 ENCOUNTER — Encounter: Payer: Self-pay | Admitting: Internal Medicine

## 2016-08-17 ENCOUNTER — Ambulatory Visit: Payer: Self-pay | Attending: Internal Medicine | Admitting: Internal Medicine

## 2016-08-17 ENCOUNTER — Encounter: Payer: Self-pay | Admitting: Internal Medicine

## 2016-08-17 VITALS — BP 132/83 | HR 100 | Temp 99.2°F | Resp 16 | Wt 174.6 lb

## 2016-08-17 DIAGNOSIS — Z803 Family history of malignant neoplasm of breast: Secondary | ICD-10-CM

## 2016-08-17 DIAGNOSIS — Z131 Encounter for screening for diabetes mellitus: Secondary | ICD-10-CM

## 2016-08-17 DIAGNOSIS — N920 Excessive and frequent menstruation with regular cycle: Secondary | ICD-10-CM

## 2016-08-17 DIAGNOSIS — Z23 Encounter for immunization: Secondary | ICD-10-CM

## 2016-08-17 DIAGNOSIS — Z79899 Other long term (current) drug therapy: Secondary | ICD-10-CM | POA: Insufficient documentation

## 2016-08-17 DIAGNOSIS — J4520 Mild intermittent asthma, uncomplicated: Secondary | ICD-10-CM

## 2016-08-17 DIAGNOSIS — N946 Dysmenorrhea, unspecified: Secondary | ICD-10-CM | POA: Insufficient documentation

## 2016-08-17 LAB — POCT GLYCOSYLATED HEMOGLOBIN (HGB A1C): Hemoglobin A1C: 4.7

## 2016-08-17 NOTE — Progress Notes (Signed)
Dawn Doryshley Losh, is a 31 y.o. female  UEA:[id number]  [id number]  DOB - 02/25/1986  Chief Complaint  Patient presents with  . Follow-up        Subjective:   Dawn Robertson is a 31 y.o. female here today for a follow up visit, last seen 08/08/16 for asthma exacerbation. Overall doing much better. Taking all her meds. Her PF avg this am was 350s prior to arrival (much better than the 100-200s we had last exam)  Asked about heavy menses. Typically heavy for first 3 days or so, so clotting. Also c/o of painful menses the first day, but than improves.   Patient has No headache, No chest pain, No abdominal pain - No Nausea, No new weakness tingling or numbness, No Cough - SOB.  No problems updated.  ALLERGIES: No Known Allergies  PAST MEDICAL HISTORY: Past Medical History:  Diagnosis Date  . Asthma    seasonal  . Seasonal allergies   . Shortness of breath   . Tobacco abuse     MEDICATIONS AT HOME: Prior to Admission medications   Medication Sig Start Date End Date Taking? Authorizing Provider  albuterol (VENTOLIN HFA) 108 (90 Base) MCG/ACT inhaler INHALE 2 PUFFS INTO THE LUNGS EVERY 6 HOURS AS NEEDED FOR WHEEZING OR SHORTNESS OF BREATH. 06/08/16   Hairston, Oren BeckmannMandesia R, FNP  fexofenadine (ALLEGRA ODT) 30 MG disintegrating tablet Take 1 tablet (30 mg total) by mouth daily. 08/08/16   Jesusa Stenerson, Kathaleen Grinderawn T, MD  fluticasone (FLONASE) 50 MCG/ACT nasal spray Place 2 sprays into both nostrils daily. 08/08/16   Terrel Nesheiwat T, MD  Fluticasone-Salmeterol (ADVAIR DISKUS) 250-50 MCG/DOSE AEPB Inhale 1 puff into the lungs 2 (two) times daily. 08/08/16   Pete GlatterLangeland, Betsy Rosello T, MD  guaiFENesin (MUCINEX) 600 MG 12 hr tablet Take 1 tablet (600 mg total) by mouth 2 (two) times daily. For 7 days. Patient not taking: Reported on 08/08/2016 06/08/16   Lizbeth BarkHairston, Mandesia R, FNP  ipratropium-albuterol (DUONEB) 0.5-2.5 (3) MG/3ML SOLN Take 3 mLs by nebulization every 4 (four) hours as needed. For sob.  08/08/16   Tishia Maestre T, MD  montelukast (SINGULAIR) 10 MG tablet Take 1 tablet (10 mg total) by mouth at bedtime. 08/08/16   Pete GlatterLangeland, Lakendria Nicastro T, MD  oseltamivir (TAMIFLU) 75 MG capsule Take 1 capsule (75 mg total) by mouth 2 (two) times daily. Patient not taking: Reported on 08/08/2016 06/08/16   Lizbeth BarkHairston, Mandesia R, FNP     Objective:   Vitals:   08/17/16 1011  BP: 132/83  Pulse: 100  Resp: 16  Temp: 99.2 F (37.3 C)  TempSrc: Oral  SpO2: 100%  Weight: 174 lb 9.6 oz (79.2 kg)    Exam General appearance : Awake, alert, not in any distress. Speech Clear. Not toxic looking, pleasant HEENT: Atraumatic and Normocephalic, pupils equally reactive to light. Neck: supple, no JVD.  Chest:Good air entry bilaterally, no added sounds. CVS: S1 S2 regular, no murmurs/gallups or rubs. Abdomen: Bowel sounds active, Non tender and not distended with no gaurding, rigidity or rebound. Extremities: B/L Lower Ext shows no edema, both legs are warm to touch Neurology: Awake alert, and oriented X 3, CN II-XII grossly intact, Non focal Skin:No Rash  Data Review Lab Results  Component Value Date   HGBA1C 4.7 08/17/2016   HGBA1C 4 5 06/08/2015    Depression screen PHQ 2/9 08/17/2016 08/08/2016 03/23/2016 02/26/2016 11/24/2015  Decreased Interest 3 3 3 3 3   Down, Depressed, Hopeless 3 3 3 3 1   PHQ -  2 Score 6 6 6 6 4   Altered sleeping 2 3 3 3 3   Tired, decreased energy 3 3 3 3 3   Change in appetite 3 3 3 3 3   Feeling bad or failure about yourself  3 3 3 3  0  Trouble concentrating 3 1 3 3 3   Moving slowly or fidgety/restless 0 0 0 0 1  Suicidal thoughts 0 0 1 1 0  PHQ-9 Score 20 19 22 22 17   Difficult doing work/chores - - - - -      Assessment & Plan   1. Mild intermittent asthma, unspecified whether complicated - does not smoke, and pt's mother just stopped as well, so less 2nd hand exposure Much better controlled, same regimen. Check PFs often. If lower than her avg of 350s, need to  take caution, and up her duonebs, etc - she is taking benadryl and flonase now for allergies as well.  2. Family history of breast cancer in mother Mother dx possibly mid 63s, did not do genetic testing.  3. Menorrhagia with regular cycle dw pt options, including ocp trial, vs vag US/pelvic US to eval for fibroids. Cbc in Dec 2017 unremarkable. She wants to hold off on this for now.  4. Diabetes mellitus screening - HgB A1c 4.7     Patient have been counseled extensively about nutrition and exercise  Return in about 3 months (around 11/17/2016), or if symptoms worsen or fail to improve.  The patient was given clear instructions to go to ER or return to medical center if symptoms don't improve, worsen or new problems develop. The patient verbalized understanding. The patient was told to call to get lab results if they haven't heard anything in the next week.   This note has been created with Education officer, environmental. Any transcriptional errors are unintentional.   Pete Glatter, MD, MBA/MHA [facility] and [facility] [place], [place] [phone]   08/17/2016, 11:53 AM

## 2016-08-17 NOTE — Patient Instructions (Addendum)
Asthma, Adult Asthma is a condition of the lungs in which the airways tighten and narrow. Asthma can make it hard to breathe. Asthma cannot be cured, but medicine and lifestyle changes can help control it. Asthma may be started (triggered) by:  Animal skin flakes (dander).  Dust.  Cockroaches.  Pollen.  Mold.  Smoke.  Cleaning products.  Hair sprays or aerosol sprays.  Paint fumes or strong smells.  Cold air, weather changes, and winds.  Crying or laughing hard.  Stress.  Certain medicines or drugs.  Foods, such as dried fruit, potato chips, and sparkling grape juice.  Infections or conditions (colds, flu).  Exercise.  Certain medical conditions or diseases.  Exercise or tiring activities. Follow these instructions at home:  Take medicine as told by your doctor.  Use a peak flow meter as told by your doctor. A peak flow meter is a tool that measures how well the lungs are working.  Record and keep track of the peak flow meter's readings.  Understand and use the asthma action plan. An asthma action plan is a written plan for taking care of your asthma and treating your attacks.  To help prevent asthma attacks:  Do not smoke. Stay away from secondhand smoke.  Change your heating and air conditioning filter often.  Limit your use of fireplaces and wood stoves.  Get rid of pests (such as roaches and mice) and their droppings.  Throw away plants if you see mold on them.  Clean your floors. Dust regularly. Use cleaning products that do not smell.  Have someone vacuum when you are not home. Use a vacuum cleaner with a HEPA filter if possible.  Replace carpet with wood, tile, or vinyl flooring. Carpet can trap animal skin flakes and dust.  Use allergy-proof pillows, mattress covers, and box spring covers.  Wash bed sheets and blankets every week in hot water and dry them in a dryer.  Use blankets that are made of polyester or cotton.  Clean bathrooms  and kitchens with bleach. If possible, have someone repaint the walls in these rooms with mold-resistant paint. Keep out of the rooms that are being cleaned and painted.  Wash hands often. Contact a doctor if:  You have make a whistling sound when breaking (wheeze), have shortness of breath, or have a cough even if taking medicine to prevent attacks.  The colored mucus you cough up (sputum) is thicker than usual.  The colored mucus you cough up changes from clear or white to yellow, green, gray, or bloody.  You have problems from the medicine you are taking such as:  A rash.  Itching.  Swelling.  Trouble breathing.  You need reliever medicines more than 2-3 times a week.  Your peak flow measurement is still at 50-79% of your personal best after following the action plan for 1 hour.  You have a fever. Get help right away if:  You seem to be worse and are not responding to medicine during an asthma attack.  You are short of breath even at rest.  You get short of breath when doing very little activity.  You have trouble eating, drinking, or talking.  You have chest pain.  You have a fast heartbeat.  Your lips or fingernails start to turn blue.  You are light-headed, dizzy, or faint.  Your peak flow is less than 50% of your personal best. This information is not intended to replace advice given to you by your health care provider. Make sure  you discuss any questions you have with your health care provider. Document Released: 08/31/2007 Document Revised: 08/20/2015 Document Reviewed: 10/11/2012 Elsevier Interactive Patient Education  2017 Elsevier Inc. Td Vaccine (Tetanus and Diphtheria): What You Need to Know 1. Why get vaccinated? Tetanus  and diphtheria are very serious diseases. They are rare in the [place] today, but people who do become infected often have severe complications. Td vaccine is used to protect adolescents and adults from both of these  diseases. Both tetanus and diphtheria are infections caused by bacteria. Diphtheria spreads from person to person through coughing or sneezing. Tetanus-causing bacteria enter the body through cuts, scratches, or wounds. TETANUS (lockjaw) causes painful muscle tightening and stiffness, usually all over the body.  It can lead to tightening of muscles in the head and neck so you can't open your mouth, swallow, or sometimes even breathe. Tetanus kills about 1 out of every 10 people who are infected even after receiving the best medical care. DIPHTHERIA can cause a thick coating to form in the back of the throat.  It can lead to breathing problems, paralysis, heart failure, and death. Before vaccines, as many as 200,000 cases of diphtheria and hundreds of cases of tetanus were reported in the [place] each year. Since vaccination began, reports of cases for both diseases have dropped by about 99%. 2. Td vaccine Td vaccine can protect adolescents and adults from tetanus and diphtheria. Td is usually given as a booster dose every 10 years but it can also be given earlier after a severe and dirty wound or burn. Another vaccine, called Tdap, which protects against pertussis in addition to tetanus and diphtheria, is sometimes recommended instead of Td vaccine. Your doctor or the person giving you the vaccine can give you more information. Td may safely be given at the same time as other vaccines. 3. Some people should not get this vaccine  A person who has ever had a life-threatening allergic reaction after a previous dose of any tetanus or diphtheria containing vaccine, OR has a severe allergy to any part of this vaccine, should not get Td vaccine. Tell the person giving the vaccine about any severe allergies.  Talk to your doctor if you:  had severe pain or swelling after any vaccine containing diphtheria or tetanus,  ever had a condition called Guillain Barre Syndrome (GBS),  aren't feeling  well on the day the shot is scheduled. 4. What are the risks from Td vaccine? With any medicine, including vaccines, there is a chance of side effects. These are usually mild and go away on their own. Serious reactions are also possible but are rare. Most people who get Td vaccine do not have any problems with it. Mild problems following Td vaccine:  (Did not interfere with activities)  Pain where the shot was given (about 8 people in 10)  Redness or swelling where the shot was given (about 1 person in 4)  Mild fever (rare)  Headache (about 1 person in 4)  Tiredness (about 1 person in 4) Moderate problems following Td vaccine:  (Interfered with activities, but did not require medical attention)  Fever over 102F (rare) Severe problems following Td vaccine:  (Unable to perform usual activities; required medical attention)  Swelling, severe pain, bleeding and/or redness in the arm where the shot was given (rare). Problems that could happen after any vaccine:   People sometimes faint after a medical procedure, including vaccination. Sitting or lying down for about 15 minutes can help  prevent fainting, and injuries caused by a fall. Tell your doctor if you feel dizzy, or have vision changes or ringing in the ears.  Some people get severe pain in the shoulder and have difficulty moving the arm where a shot was given. This happens very rarely.  Any medication can cause a severe allergic reaction. Such reactions from a vaccine are very rare, estimated at fewer than 1 in a million doses, and would happen within a few minutes to a few hours after the vaccination. As with any medicine, there is a very remote chance of a vaccine causing a serious injury or death. The safety of vaccines is always being monitored. For more information, visit: http://floyd.org/ 5. What if there is a serious reaction? What should I look for?  Look for anything that concerns you, such as signs of a severe  allergic reaction, very high fever, or unusual behavior. Signs of a severe allergic reaction can include hives, swelling of the face and throat, difficulty breathing, a fast heartbeat, dizziness, and weakness. These would usually start a few minutes to a few hours after the vaccination. What should I do?   If you think it is a severe allergic reaction or other emergency that can't wait, call 9-1-1 or get the person to the nearest hospital. Otherwise, call your doctor.  Afterward, the reaction should be reported to the Vaccine Adverse Event Reporting System (VAERS). Your doctor might file this report, or you can do it yourself through the VAERS web site at www.vaers.LAgents.no, or by calling 1-209 613 6325.  VAERS does not give medical advice. 6. The National Vaccine Injury Compensation Program The [employer] Vaccine Injury Compensation Program (VICP) is a federal program that was created to compensate people who may have been injured by certain vaccines. Persons who believe they may have been injured by a vaccine can learn about the program and about filing a claim by calling 1-6501518294 or visiting the VICP website at SpiritualWord.at. There is a time limit to file a claim for compensation. 7. How can I learn more?  Ask your doctor. He or she can give you the vaccine package insert or suggest other sources of information.  Call your local or state health department.  Contact the Centers for Disease Control and Prevention (CDC):  Call 469 249 2748 (1-800-CDC-INFO)  Visit CDC's website at PicCapture.uy CDC Td Vaccine VIS (07/07/15) This information is not intended to replace advice given to you by your health care provider. Make sure you discuss any questions you have with your health care provider. Document Released: 01/09/2006 Document Revised: 12/03/2015 Document Reviewed: 12/03/2015 Elsevier Interactive Patient Education  2017 Elsevier Inc. Pneumococcal  Polysaccharide Vaccine: What You Need to Know 1. Why get vaccinated? Vaccination can protect older adults (and some children and younger adults) from pneumococcal disease. Pneumococcal disease is caused by bacteria that can spread from person to person through close contact. It can cause ear infections, and it can also lead to more serious infections of the:  Lungs (pneumonia),  Blood (bacteremia), and  Covering of the brain and spinal cord (meningitis). Meningitis can cause deafness and brain damage, and it can be fatal. Anyone can get pneumococcal disease, but children under 70 years of age, people with certain medical conditions, adults over 64 years of age, and cigarette smokers are at the highest risk. About 18,000 older adults die each year from pneumococcal disease in the [place]. Treatment of pneumococcal infections with penicillin and other drugs used to be more effective. But some strains  of the disease have become resistant to these drugs. This makes prevention of the disease, through vaccination, even more important. 2. Pneumococcal polysaccharide vaccine (PPSV23) Pneumococcal polysaccharide vaccine (PPSV23) protects against 23 types of pneumococcal bacteria. It will not prevent all pneumococcal disease. PPSV23 is recommended for:  All adults 49 years of age and older,  Anyone 2 through 32 years of age with certain long-term health problems,  Anyone 2 through 33 years of age with a weakened immune system,  Adults 3 through 32 years of age who smoke cigarettes or have asthma. Most people need only one dose of PPSV. A second dose is recommended for certain high-risk groups. People 64 and older should get a dose even if they have gotten one or more doses of the vaccine before they turned 65. Your healthcare provider can give you more information about these recommendations. Most healthy adults develop protection within 2 to 3 weeks of getting the shot. 3. Some people should  not get this vaccine  Anyone who has had a life-threatening allergic reaction to PPSV should not get another dose.  Anyone who has a severe allergy to any component of PPSV should not receive it. Tell your provider if you have any severe allergies.  Anyone who is moderately or severely ill when the shot is scheduled may be asked to wait until they recover before getting the vaccine. Someone with a mild illness can usually be vaccinated.  Children less than 76 years of age should not receive this vaccine.  There is no evidence that PPSV is harmful to either a pregnant woman or to her fetus. However, as a precaution, women who need the vaccine should be vaccinated before becoming pregnant, if possible. 4. Risks of a vaccine reaction With any medicine, including vaccines, there is a chance of side effects. These are usually mild and go away on their own, but serious reactions are also possible. About half of people who get PPSV have mild side effects, such as redness or pain where the shot is given, which go away within about two days. Less than 1 out of 100 people develop a fever, muscle aches, or more severe local reactions. Problems that could happen after any vaccine:   People sometimes faint after a medical procedure, including vaccination. Sitting or lying down for about 15 minutes can help prevent fainting, and injuries caused by a fall. Tell your doctor if you feel dizzy, or have vision changes or ringing in the ears.  Some people get severe pain in the shoulder and have difficulty moving the arm where a shot was given. This happens very rarely.  Any medication can cause a severe allergic reaction. Such reactions from a vaccine are very rare, estimated at about 1 in a million doses, and would happen within a few minutes to a few hours after the vaccination. As with any medicine, there is a very remote chance of a vaccine causing a serious injury or death. The safety of vaccines is always  being monitored. For more information, visit: http://floyd.org/ 5. What if there is a serious reaction? What should I look for?  Look for anything that concerns you, such as signs of a severe allergic reaction, very high fever, or unusual behavior. Signs of a severe allergic reaction can include hives, swelling of the face and throat, difficulty breathing, a fast heartbeat, dizziness, and weakness. These would usually start a few minutes to a few hours after the vaccination. What should I do?  If you think  it is a severe allergic reaction or other emergency that can't wait, call 9-1-1 or get to the nearest hospital. Otherwise, call your doctor. Afterward, the reaction should be reported to the Vaccine Adverse Event Reporting System (VAERS). Your doctor might file this report, or you can do it yourself through the VAERS web site at www.vaers.LAgents.no, or by calling 1-850-624-7539. VAERS does not give medical advice.  6. How can I learn more?  Ask your doctor. He or she can give you the vaccine package insert or suggest other sources of information.  Call your local or state health department.  Contact the Centers for Disease Control and Prevention (CDC):  Call (406) 797-1147 (1-800-CDC-INFO) or  Visit CDC's website at PicCapture.uy CDC Pneumococcal Polysaccharide Vaccine VIS (07/19/13) This information is not intended to replace advice given to you by your health care provider. Make sure you discuss any questions you have with your health care provider. Document Released: 01/09/2006 Document Revised: 12/03/2015 Document Reviewed: 12/03/2015 Elsevier Interactive Patient Education  2017 [employer].

## 2016-09-30 MED FILL — MONTELUKAST SOD 10 MG TAB: 10 | 30 days supply | Qty: 30 | Fill #1

## 2016-09-30 MED FILL — !ADVAIR 250/50 DISKUS: 250-50 | 30 days supply | Qty: 60 | Fill #0

## 2016-09-30 MED FILL — $VENTOLIN HFA 18G INHALER: 108 (90 BAS | 25 days supply | Qty: 18 | Fill #0

## 2016-10-12 ENCOUNTER — Other Ambulatory Visit: Payer: Self-pay

## 2016-10-12 DIAGNOSIS — J4540 Moderate persistent asthma, uncomplicated: Secondary | ICD-10-CM

## 2016-10-12 DIAGNOSIS — J45901 Unspecified asthma with (acute) exacerbation: Secondary | ICD-10-CM

## 2016-10-12 MED ORDER — ALBUTEROL SULFATE HFA 108 (90 BASE) MCG/ACT IN AERS: INHALATION_SPRAY | RESPIRATORY_TRACT | 3 refills | Status: DC

## 2016-10-12 MED ORDER — FLUTICASONE-SALMETEROL 250-50 MCG/DOSE IN AEPB: 1.0000 | INHALATION_SPRAY | Freq: Two times a day (BID) | RESPIRATORY_TRACT | 3 refills | Status: DC

## 2016-10-20 MED FILL — $VENTOLIN HFA 18G INHALER: 108 (90 BAS | 25 days supply | Qty: 18 | Fill #1

## 2016-11-03 MED FILL — FLUTICASONE PROP 50 MCG SPR: 50 | 30 days supply | Qty: 16 | Fill #1

## 2016-11-03 MED FILL — !ADVAIR 250/50 DISKUS: 250-50 | 30 days supply | Qty: 60 | Fill #1

## 2016-11-03 MED FILL — !VENTOLIN HFA INHALER: 108 (90 BAS | 25 days supply | Qty: 18 | Fill #2

## 2016-11-14 ENCOUNTER — Ambulatory Visit: Payer: Self-pay | Attending: Internal Medicine | Admitting: Internal Medicine

## 2016-11-14 ENCOUNTER — Encounter: Payer: Self-pay | Admitting: Internal Medicine

## 2016-11-14 VITALS — BP 130/85 | HR 100 | Temp 98.5°F | Resp 16 | Wt 171.6 lb

## 2016-11-14 DIAGNOSIS — Z7951 Long term (current) use of inhaled steroids: Secondary | ICD-10-CM | POA: Insufficient documentation

## 2016-11-14 DIAGNOSIS — Z825 Family history of asthma and other chronic lower respiratory diseases: Secondary | ICD-10-CM | POA: Insufficient documentation

## 2016-11-14 DIAGNOSIS — J4540 Moderate persistent asthma, uncomplicated: Secondary | ICD-10-CM

## 2016-11-14 DIAGNOSIS — Z87891 Personal history of nicotine dependence: Secondary | ICD-10-CM

## 2016-11-14 DIAGNOSIS — F411 Generalized anxiety disorder: Secondary | ICD-10-CM

## 2016-11-14 DIAGNOSIS — Z6831 Body mass index (BMI) 31.0-31.9, adult: Secondary | ICD-10-CM | POA: Insufficient documentation

## 2016-11-14 DIAGNOSIS — Z1331 Encounter for screening for depression: Secondary | ICD-10-CM | POA: Insufficient documentation

## 2016-11-14 DIAGNOSIS — E669 Obesity, unspecified: Secondary | ICD-10-CM

## 2016-11-14 DIAGNOSIS — Z803 Family history of malignant neoplasm of breast: Secondary | ICD-10-CM | POA: Insufficient documentation

## 2016-11-14 DIAGNOSIS — Z1389 Encounter for screening for other disorder: Secondary | ICD-10-CM

## 2016-11-14 NOTE — Patient Instructions (Signed)
Use your PEAK Flow meter daily to get a good idea of your baseline level. Continue current inhalers, and  Singulair.  Use Albuterol inhaler 15 minutes before exercise.  Let me know if you change your mind about seeing our clinical social worker.  Follow a Healthy Eating Plan - You can do it! Limit sugary drinks.  Avoid sodas, sweet tea, sport or energy drinks, or fruit drinks.  Drink water, lo-fat milk, or diet drinks. Limit snack foods.   Cut back on candy, cake, cookies, chips, ice cream.  These are a special treat, only in small amounts. Eat plenty of vegetables.  Especially dark green, red, and orange vegetables. Aim for at least 3 servings a day. More is better! Include fruit in your daily diet.  Whole fruit is much healthier than fruit juice! Limit "white" bread, "white" pasta, "white" rice.   Choose "100% whole grain" products, brown or wild rice. Avoid fatty meats. Try "Meatless Monday" and choose eggs or beans one day a week.  When eating meat, choose lean meats like chicken, [place], and fish.  Grill, broil, or bake meats instead of frying, and eat poultry without the skin. Eat less salt.  Avoid frozen pizzas, frozen dinners and salty foods.  Use seasonings other than salt in cooking.  This can help blood pressure and keep you from swelling Beer, wine and liquor have calories.  If you can safely drink alcohol, limit to 1 drink per day for women, 2 drinks for men

## 2016-11-14 NOTE — Progress Notes (Signed)
Patient ID: LILINOE ACKLIN, female    DOB: 1986/02/17  MRN: [id number]  CC: re-establish and Asthma   Subjective: Dawn Robertson is a 31 y.o. female who presents to become established with me as PCP  Her concerns today include:  Asthma moderate persistent, former tob dep, fhx of breast CA in mother, seasonal allergies and menorrhagia  1. Asthma: "doing fine if I have all my meds." Taking Advair BID. Increase use of Albuterol in heat, cold and rainy weather -Has PEAK flow meter but has not checked lately. Few mths ago she was at 350.  -quit smoking few yrs  2. Screen pos for depression/anxiety: "I'm an over thinker.I try not to be depress but I feel that I am.  When I am, I don't want to clean up and be bothered with people." -never treated for depression or anxiety.  Does not want to be on any medication at this time No SI/HI  3. Wgh: -Feels she is overweight and wanting to do something about it Stopped eating meat x 1 mth -not exercising much but plans to start. Gets tired running up steps -asthma bothers her only a little with exercise Patient Active Problem List   Diagnosis Date Noted  . Former smoker 11/14/2016  . Obesity (BMI 30.0-34.9) 11/14/2016  . Anxiety state 11/14/2016  . Positive depression screening 11/14/2016  . Family history of breast cancer in mother 09/18/2015  . Asthma 11/27/2014  . Acute respiratory failure (HCC) 07/15/2014  . Asthma   . Tobacco abuse      Current Outpatient Prescriptions on File Prior to Visit  Medication Sig Dispense Refill  . albuterol (VENTOLIN HFA) 108 (90 Base) MCG/ACT inhaler INHALE 2 PUFFS INTO THE LUNGS EVERY 6 HOURS AS NEEDED FOR WHEEZING OR SHORTNESS OF BREATH. 54 Inhaler 3  . fexofenadine (ALLEGRA ODT) 30 MG disintegrating tablet Take 1 tablet (30 mg total) by mouth daily. 90 tablet 2  . fluticasone (FLONASE) 50 MCG/ACT nasal spray Place 2 sprays into both nostrils daily. 16 g 6  . Fluticasone-Salmeterol (ADVAIR  DISKUS) 250-50 MCG/DOSE AEPB Inhale 1 puff into the lungs 2 (two) times daily. 180 each 3  . ipratropium-albuterol (DUONEB) 0.5-2.5 (3) MG/3ML SOLN Take 3 mLs by nebulization every 4 (four) hours as needed. For sob. 360 mL 2  . montelukast (SINGULAIR) 10 MG tablet Take 1 tablet (10 mg total) by mouth at bedtime. 30 tablet 3  . [DISCONTINUED] diphenhydrAMINE (BENADRYL) 25 MG tablet Take 25 mg by mouth every 6 (six) hours as needed. For allergies     No current facility-administered medications on file prior to visit.     No Known Allergies  Social History   Social History  . Marital status: Single    Spouse name: N/A  . Number of children: N/A  . Years of education: N/A   Occupational History  . Not on file.   Social History Main Topics  . Smoking status: Former Smoker    Types: Cigarettes    Quit date: 03/28/2013  . Smokeless tobacco: Never Used     Comment: hx of 1 pk every 3 days  . Alcohol use 0.0 oz/week     Comment: daily glass of wine  . Drug use: No  . Sexual activity: No   Other Topics Concern  . Not on file   Social History Narrative  . No narrative on file    Family History  Problem Relation Age of Onset  . Asthma Mother   .  Breast cancer Mother 80       s/p surgery and radiation  . Other Mother        hx of hysterectomy for bleeding/blood clotting in her 87s  . Asthma Father   . Diabetes type II Paternal Aunt   . Prostate cancer Other        maternal great uncle (MGM's brother) d. later age    No past surgical history on file.  ROS: Review of Systems Negative except as stated above PHYSICAL EXAM: BP 130/85   Pulse 100   Temp 98.5 F (36.9 C) (Oral)   Resp 16   Wt 171 lb 9.6 oz (77.8 kg)   LMP 10/24/2016   SpO2 97%   BMI 31.39 kg/m   Wt Readings from Last 3 Encounters:  11/14/16 171 lb 9.6 oz (77.8 kg)  08/17/16 174 lb 9.6 oz (79.2 kg)  08/08/16 175 lb (79.4 kg)    Physical Exam  General appearance - alert, well appearing, overweight  young African-American female and in no distress Mental status -patient tearful at times Neck - supple, no significant adenopathy Chest - clear to auscultation, no wheezes, rales or rhonchi, symmetric air entry Heart - normal rate, regular rhythm, normal S1, S2, no murmurs, rubs, clicks or gallops Extremities - peripheral pulses normal, no pedal edema, no clubbing or cyanosis   Depression screen [facility] 2/9 11/14/2016 08/17/2016 08/08/2016 03/23/2016 02/26/2016  Decreased Interest 3 3 3 3 3   Down, Depressed, Hopeless 3 3 3 3 3   PHQ - 2 Score 6 6 6 6 6   Altered sleeping 3 2 3 3 3   Tired, decreased energy 3 3 3 3 3   Change in appetite 0 3 3 3 3   Feeling bad or failure about yourself  3 3 3 3 3   Trouble concentrating 0 3 1 3 3   Moving slowly or fidgety/restless 0 0 0 0 0  Suicidal thoughts 3 0 0 1 1  PHQ-9 Score 18 20 19 22 22   Difficult doing work/chores - - - - -   GAD 7 : Generalized Anxiety Score 11/14/2016 08/17/2016 08/08/2016 03/23/2016  Nervous, Anxious, on Edge 3 3 3  0  Control/stop worrying 0 3 3 3   Worry too much - different things 3 3 3 3   Trouble relaxing 3 3 3 3   Restless 0 0 1 0  Easily annoyed or irritable 3 3 3 3   Afraid - awful might happen 3 1 1 3   Total GAD 7 Score 15 16 17 15   Anxiety Difficulty - - - -     ASSESSMENT AND PLAN: 1. Moderate persistent asthma without complication Continue albuterol as rescue and Advair at current dose for maintenance inhaler -Encourage patient to check peak flow every morning to establish her baseline. Informed that when peak flow drops to about half of her baseline she needs to be seen  2. Positive depression screening 3. Anxiety state -Declined meeting with LCSW today. He is not interested in being on medication.  4. Obesity (BMI 30.0-34.9) -Patient counseled on healthy eating habits. Printed information also given. Also consult on exercise. Advised to use albuterol inhaler 15 minutes before exercise and also after exercise  5.  Former smoker    Patient was given the opportunity to ask questions.  Patient verbalized understanding of the plan and was able to repeat key elements of the plan.   No orders of the defined types were placed in this encounter.    Requested Prescriptions    No  prescriptions requested or ordered in this encounter    Return in about 3 months (around 02/14/2017).  Jonah Blue, MD, FACP

## 2016-12-02 MED FILL — !ADVAIR 250/50 DISKUS: 250-50 | 30 days supply | Qty: 60 | Fill #2

## 2016-12-02 MED FILL — ?MONTELUKAST SOD 10MG TAB: 10 | 30 days supply | Qty: 30 | Fill #2

## 2016-12-02 MED FILL — !VENTOLIN HFA INHALER: 108 (90 BAS | 25 days supply | Qty: 18 | Fill #3

## 2016-12-19 MED FILL — FLUTICASONE PROP 50 MCG SPR: 50 | 30 days supply | Qty: 16 | Fill #2

## 2016-12-19 MED FILL — !VENTOLIN HFA INHALER: 108 (90 BAS | 25 days supply | Qty: 18 | Fill #4

## 2017-01-05 ENCOUNTER — Encounter: Payer: Self-pay | Admitting: Internal Medicine

## 2017-01-05 ENCOUNTER — Ambulatory Visit: Payer: Managed Care, Other (non HMO) | Attending: Internal Medicine | Admitting: Internal Medicine

## 2017-01-05 VITALS — BP 115/78 | HR 101 | Temp 98.9°F | Resp 16 | Wt 170.4 lb

## 2017-01-05 DIAGNOSIS — Z87891 Personal history of nicotine dependence: Secondary | ICD-10-CM | POA: Diagnosis not present

## 2017-01-05 DIAGNOSIS — Z6831 Body mass index (BMI) 31.0-31.9, adult: Secondary | ICD-10-CM | POA: Diagnosis not present

## 2017-01-05 DIAGNOSIS — E669 Obesity, unspecified: Secondary | ICD-10-CM | POA: Diagnosis not present

## 2017-01-05 DIAGNOSIS — J4541 Moderate persistent asthma with (acute) exacerbation: Secondary | ICD-10-CM

## 2017-01-05 MED ORDER — FLUTICASONE-SALMETEROL 250-50 MCG/DOSE IN AEPB: 1.0000 | INHALATION_SPRAY | Freq: Two times a day (BID) | RESPIRATORY_TRACT | 3 refills | Status: DC

## 2017-01-05 MED ORDER — MONTELUKAST SODIUM 10 MG PO TABS: 10.0000 mg | ORAL_TABLET | Freq: Every day | ORAL | 6 refills | Status: DC

## 2017-01-05 MED ORDER — PREDNISONE 10 MG PO TABS: ORAL_TABLET | ORAL | 0 refills | Status: DC

## 2017-01-05 MED ORDER — ALBUTEROL SULFATE HFA 108 (90 BASE) MCG/ACT IN AERS: INHALATION_SPRAY | RESPIRATORY_TRACT | 6 refills | Status: DC

## 2017-01-05 MED ORDER — ALBUTEROL SULFATE HFA 108 (90 BASE) MCG/ACT IN AERS
INHALATION_SPRAY | RESPIRATORY_TRACT | 6 refills | Status: DC
Start: 2017-01-05 — End: 2017-06-07

## 2017-01-05 MED FILL — !VENTOLIN HFA INHALER: 108 (90 BAS | 25 days supply | Qty: 18 | Fill #0

## 2017-01-05 MED FILL — ?PREDNISONE 10 MG TABLET: 10 | 8 days supply | Qty: 13 | Fill #0

## 2017-01-05 MED FILL — ?MONTELUKAST SOD 10MG TAB: 10 | 30 days supply | Qty: 30 | Fill #0

## 2017-01-05 MED FILL — !ADVAIR 250/50 DISKUS: 250-50 | 30 days supply | Qty: 60 | Fill #0

## 2017-01-05 NOTE — Patient Instructions (Addendum)
Try to avoid running out of your inhalers. Get back on Advair.  Take the Prednisone taper as prescribed.    You have set goal to cut back on eating rice no more than twice a week and to stop drinking pineapple juice. I have included some healthy eating tips below.  Follow a Healthy Eating Plan - You can do it! Limit sugary drinks.  Avoid sodas, sweet tea, sport or energy drinks, or fruit drinks.  Drink water, lo-fat milk, or diet drinks. Limit snack foods.   Cut back on candy, cake, cookies, chips, ice cream.  These are a special treat, only in small amounts. Eat plenty of vegetables.  Especially dark green, red, and orange vegetables. Aim for at least 3 servings a day. More is better! Include fruit in your daily diet.  Whole fruit is much healthier than fruit juice! Limit "white" bread, "white" pasta, "white" rice.   Choose "100% whole grain" products, brown or wild rice. Avoid fatty meats. Try "Meatless Monday" and choose eggs or beans one day a week.  When eating meat, choose lean meats like chicken, [place], and fish.  Grill, broil, or bake meats instead of frying, and eat poultry without the skin. Eat less salt.  Avoid frozen pizzas, frozen dinners and salty foods.  Use seasonings other than salt in cooking.  This can help blood pressure and keep you from swelling Beer, wine and liquor have calories.  If you can safely drink alcohol, limit to 1 drink per day for women, 2 drinks for men

## 2017-01-05 NOTE — Progress Notes (Signed)
Patient ID: Dawn Robertson, female    DOB: Feb 13, 1986  MRN: [id number]  CC: Cough and chest congestive   Subjective: Dawn Robertson is a 31 y.o. female who presents for chronic ds management. Last seen 10/2016 Her concerns today include:  Pt with hx of mod asthma, + dep/anxiety screen, obesity, fhx of breast CA in mom, seasonal allergies and menorrhagia  1. Constant cough and feeling SOB x 5 days. Cough mostly dry but sometimes phlegm -inc use of Albuterol. Previously was using BID but over past wk she had to use QID. Asthma flares during season changes -using mneb 2-3 x a day for past wk.  -out of Adviair x 1 wk. Out of Singulair x 1 day.  -no sneezing, rhinorrhea, itchy throat or eyes  -does not smoke -does not want flu shot -Peak flow usually 300-350. Down to 250 over past wk -  2. Drinks one 30 oz cup of coffee a day, down from two of them. -not limiting white carbs.  "Hard for me to let go of the Jasmine rice." Eats rice 3 days a wk.   Patient Active Problem List   Diagnosis Date Noted  . Former smoker 11/14/2016  . Obesity (BMI 30.0-34.9) 11/14/2016  . Anxiety state 11/14/2016  . Positive depression screening 11/14/2016  . Family history of breast cancer in mother 09/18/2015  . Moderate persistent asthma      Current Outpatient Prescriptions on File Prior to Visit  Medication Sig Dispense Refill  . fluticasone (FLONASE) 50 MCG/ACT nasal spray Place 2 sprays into both nostrils daily. 16 g 6  . ipratropium-albuterol (DUONEB) 0.5-2.5 (3) MG/3ML SOLN Take 3 mLs by nebulization every 4 (four) hours as needed. For sob. 360 mL 2  . [DISCONTINUED] diphenhydrAMINE (BENADRYL) 25 MG tablet Take 25 mg by mouth every 6 (six) hours as needed. For allergies     No current facility-administered medications on file prior to visit.     No Known Allergies  Social History   Social History  . Marital status: Single    Spouse name: N/A  . Number of children: N/A  . Years  of education: N/A   Occupational History  . Not on file.   Social History Main Topics  . Smoking status: Former Smoker    Types: Cigarettes    Quit date: 03/28/2013  . Smokeless tobacco: Never Used     Comment: hx of 1 pk every 3 days  . Alcohol use 0.0 oz/week     Comment: daily glass of wine  . Drug use: No  . Sexual activity: No   Other Topics Concern  . Not on file   Social History Narrative  . No narrative on file    Family History  Problem Relation Age of Onset  . Asthma Mother   . Breast cancer Mother 28       s/p surgery and radiation  . Other Mother        hx of hysterectomy for bleeding/blood clotting in her 35s  . Asthma Father   . Diabetes type II Paternal Aunt   . Prostate cancer Other        maternal great uncle (MGM's brother) d. later age    No past surgical history on file.  ROS: Review of Systems Neg except as above PHYSICAL EXAM: BP 115/78   Pulse (!) 101   Temp 98.9 F (37.2 C) (Oral)   Resp 16   Wt 170 lb 6.4 oz (77.3 kg)  SpO2 96%   BMI 31.17 kg/m   Wt Readings from Last 3 Encounters:  01/05/17 170 lb 6.4 oz (77.3 kg)  11/14/16 171 lb 9.6 oz (77.8 kg)  08/17/16 174 lb 9.6 oz (79.2 kg)   Physical Exam General appearance - alert, well appearing, and in no distress Mental status - alert, oriented to person, place, and time, normal mood, behavior, speech, dress, motor activity, and thought processes Nose - moderately enlarged turbinates Mouth - mucous membranes moist, pharynx normal without lesions Neck - supple, no significant adenopathy Chest - mild to moderate expiratory wheezing. Pt talking in complete sentences Heart - normal rate, regular rhythm, normal S1, S2, no murmurs, rubs, clicks or gallops   ASSESSMENT AND PLAN: 1. Moderate persistent asthma with acute exacerbation -RF on Advair Prednisone taper Continue mneb PRN Continue to monitor peak flow Pt decline flu shot - Fluticasone-Salmeterol (ADVAIR DISKUS) 250-50  MCG/DOSE AEPB; Inhale 1 puff into the lungs 2 (two) times daily.  Dispense: 180 each; Refill: 3 - montelukast (SINGULAIR) 10 MG tablet; Take 1 tablet (10 mg total) by mouth at bedtime.  Dispense: 30 tablet; Refill: 6 - predniSONE (DELTASONE) 10 MG tablet; 3 tabs PO daily x 2 days then, 2 tabs daily x 2 days then 1 tab x 2 days then 1/2 tab x 2 days.  Dispense: 13 tablet; Refill: 0 - albuterol (VENTOLIN HFA) 108 (90 Base) MCG/ACT inhaler; INHALE 2 PUFFS INTO THE LUNGS EVERY 6 HOURS AS NEEDED FOR WHEEZING OR SHORTNESS OF BREATH.  Dispense: 54 Inhaler; Refill: 6  2. Obesity (BMI 30.0-34.9) Pt has set goal to cut back on eating rice no more than twice a wk and cut out pineapple juice.  Patient was given the opportunity to ask questions.  Patient verbalized understanding of the plan and was able to repeat key elements of the plan.   No orders of the defined types were placed in this encounter.    Requested Prescriptions   Signed Prescriptions Disp Refills  . Fluticasone-Salmeterol (ADVAIR DISKUS) 250-50 MCG/DOSE AEPB 180 each 3    Sig: Inhale 1 puff into the lungs 2 (two) times daily.  . montelukast (SINGULAIR) 10 MG tablet 30 tablet 6    Sig: Take 1 tablet (10 mg total) by mouth at bedtime.  . predniSONE (DELTASONE) 10 MG tablet 13 tablet 0    Sig: 3 tabs PO daily x 2 days then, 2 tabs daily x 2 days then 1 tab x 2 days then 1/2 tab x 2 days.  Marland Kitchen albuterol (VENTOLIN HFA) 108 (90 Base) MCG/ACT inhaler 54 Inhaler 6    Sig: INHALE 2 PUFFS INTO THE LUNGS EVERY 6 HOURS AS NEEDED FOR WHEEZING OR SHORTNESS OF BREATH.    Return if symptoms worsen or fail to improve.  Jonah Blue, MD, FACP

## 2017-01-26 MED FILL — !VENTOLIN HFA INHALER: 108 (90 BAS | 25 days supply | Qty: 18 | Fill #1

## 2017-02-03 MED FILL — FLUTICASONE PROP 50 MCG SPR: 50 | 30 days supply | Qty: 16 | Fill #3

## 2017-02-03 MED FILL — ?MONTELUKAST SOD 10MG TAB: 10 | 30 days supply | Qty: 30 | Fill #1

## 2017-02-03 MED FILL — !ADVAIR 250/50 DISKUS: 250-50 | 30 days supply | Qty: 60 | Fill #1

## 2017-02-14 ENCOUNTER — Ambulatory Visit: Payer: Managed Care, Other (non HMO) | Attending: Internal Medicine | Admitting: Internal Medicine

## 2017-02-14 ENCOUNTER — Encounter: Payer: Self-pay | Admitting: Internal Medicine

## 2017-02-14 VITALS — BP 116/74 | HR 95 | Temp 98.6°F | Resp 18 | Ht 63.0 in | Wt 175.0 lb

## 2017-02-14 DIAGNOSIS — J4550 Severe persistent asthma, uncomplicated: Secondary | ICD-10-CM | POA: Diagnosis not present

## 2017-02-14 DIAGNOSIS — Z87891 Personal history of nicotine dependence: Secondary | ICD-10-CM | POA: Diagnosis not present

## 2017-02-14 DIAGNOSIS — Z7951 Long term (current) use of inhaled steroids: Secondary | ICD-10-CM | POA: Insufficient documentation

## 2017-02-14 DIAGNOSIS — Z79899 Other long term (current) drug therapy: Secondary | ICD-10-CM | POA: Diagnosis not present

## 2017-02-14 DIAGNOSIS — J45909 Unspecified asthma, uncomplicated: Secondary | ICD-10-CM | POA: Diagnosis present

## 2017-02-14 DIAGNOSIS — Z6831 Body mass index (BMI) 31.0-31.9, adult: Secondary | ICD-10-CM | POA: Insufficient documentation

## 2017-02-14 DIAGNOSIS — E669 Obesity, unspecified: Secondary | ICD-10-CM | POA: Insufficient documentation

## 2017-02-14 DIAGNOSIS — Z803 Family history of malignant neoplasm of breast: Secondary | ICD-10-CM | POA: Insufficient documentation

## 2017-02-14 DIAGNOSIS — Z825 Family history of asthma and other chronic lower respiratory diseases: Secondary | ICD-10-CM | POA: Insufficient documentation

## 2017-02-14 MED ORDER — FLUTICASONE-SALMETEROL 500-50 MCG/DOSE IN AEPB: 1.0000 | INHALATION_SPRAY | Freq: Two times a day (BID) | RESPIRATORY_TRACT | 12 refills | Status: DC

## 2017-02-14 MED ORDER — PREDNISONE 10 MG PO TABS: ORAL_TABLET | ORAL | 1 refills | Status: DC

## 2017-02-14 MED FILL — !VENTOLIN HFA INHALER: 108 (90 BAS | 25 days supply | Qty: 18 | Fill #2

## 2017-02-14 MED FILL — predniSONE 10 MG TABS: 10 | 8 days supply | Qty: 13 | Fill #0

## 2017-02-14 NOTE — Progress Notes (Signed)
Patient ID: Dawn Robertson, female    DOB: 04/20/1985  MRN: [id number]  CC: No chief complaint on file.   Subjective: Dawn Robertson is a 31 y.o. female who presents for chronic disease management. Her concerns today include:  Hx of Asthma moderate persistent, obesity, former tob dep, fhx of breast CA in mother, seasonal allergies and menorrhagia  1 Asthma: control has been up and down. -past 2 wks, she wakes up coughing in middle of night and feeling like she is choking. Does a mneb treatment and turns fan on. This helps.  -using Advair and Singulair consistently. -having to use Albuterol about 4 x a day.  -has a PEAK flow meter. On good days her PEAK flow is 350. 3 days ago she was 150-175.  -remains free of cigarettes. Lives with mom who smokes. But she tries to not be in room with her when she smokes. There is a dog in the house and house very old. Mom does not change filters as often as she should.  There has been some roof leakage also.   Patient Active Problem List   Diagnosis Date Noted  . Former smoker 11/14/2016  . Obesity (BMI 30.0-34.9) 11/14/2016  . Anxiety state 11/14/2016  . Positive depression screening 11/14/2016  . Family history of breast cancer in mother 09/18/2015  . Moderate persistent asthma      Current Outpatient Medications on File Prior to Visit  Medication Sig Dispense Refill  . albuterol (VENTOLIN HFA) 108 (90 Base) MCG/ACT inhaler INHALE 2 PUFFS INTO THE LUNGS EVERY 6 HOURS AS NEEDED FOR WHEEZING OR SHORTNESS OF BREATH. 54 Inhaler 6  . fluticasone (FLONASE) 50 MCG/ACT nasal spray Place 2 sprays into both nostrils daily. 16 g 6  . Fluticasone-Salmeterol (ADVAIR DISKUS) 250-50 MCG/DOSE AEPB Inhale 1 puff into the lungs 2 (two) times daily. 180 each 3  . ipratropium-albuterol (DUONEB) 0.5-2.5 (3) MG/3ML SOLN Take 3 mLs by nebulization every 4 (four) hours as needed. For sob. 360 mL 2  . montelukast (SINGULAIR) 10 MG tablet Take 1 tablet (10 mg  total) by mouth at bedtime. 30 tablet 6  . predniSONE (DELTASONE) 10 MG tablet 3 tabs PO daily x 2 days then, 2 tabs daily x 2 days then 1 tab x 2 days then 1/2 tab x 2 days. 13 tablet 0  . [DISCONTINUED] diphenhydrAMINE (BENADRYL) 25 MG tablet Take 25 mg by mouth every 6 (six) hours as needed. For allergies     No current facility-administered medications on file prior to visit.     No Known Allergies  Social History   Socioeconomic History  . Marital status: Single    Spouse name: Not on file  . Number of children: Not on file  . Years of education: Not on file  . Highest education level: Not on file  Social Needs  . Financial resource strain: Not on file  . Food insecurity - worry: Not on file  . Food insecurity - inability: Not on file  . Transportation needs - medical: Not on file  . Transportation needs - non-medical: Not on file  Occupational History  . Not on file  Tobacco Use  . Smoking status: Former Smoker    Types: Cigarettes    Last attempt to quit: 03/28/2013    Years since quitting: 3.8  . Smokeless tobacco: Never Used  . Tobacco comment: hx of 1 pk every 3 days  Substance and Sexual Activity  . Alcohol use: Yes  Alcohol/week: 0.0 oz    Comment: daily glass of wine  . Drug use: No  . Sexual activity: No    Birth control/protection: None  Other Topics Concern  . Not on file  Social History Narrative  . Not on file    Family History  Problem Relation Age of Onset  . Asthma Mother   . Breast cancer Mother 31       s/p surgery and radiation 92 . Other Mother        hx of hysterectomy for bleeding/blood clotting in her 5820s  . Asthma Father   . Diabetes type II Paternal Aunt   . Prostate cancer Other        maternal great uncle (MGM's brother) d. later age    No past surgical history on file.  ROS: Review of Systems Negative except as stated above PHYSICAL EXAM: BP 116/74 (BP Location: Left Arm, Patient Position: Sitting, Cuff Size: Normal)    Pulse 95   Temp 98.6 F (37 C) (Oral)   Resp 18   Ht 5\' 3"  (1.6 m)   Wt 175 lb (79.4 kg)   SpO2 96%   BMI 31.00 kg/m   Wt Readings from Last 3 Encounters:  02/14/17 175 lb (79.4 kg)  01/05/17 170 lb 6.4 oz (77.3 kg)  11/14/16 171 lb 9.6 oz (77.8 kg)   Physical Exam General appearance - alert, well appearing, and in no distress Mental status - alert, oriented to person, place, and time Neck - supple, no significant adenopathy Chest -mild scattered wheezing  heart - normal rate, regular rhythm, normal S1, S2, no murmurs, rubs, clicks or gallops  ASSESSMENT AND PLAN: 1. Severe persistent asthma without complication -Increased dose of Advair. -Prednisone taper Avoid triggers.  Advised to encourage her mother to change filters in the house regularly and to get leaky roof fixed to prevent mold -Recommend referral to pulmonary.  Patient wants to hold off and follow-up in 6 weeks.  If no better she would be agreeable to pulmonary referral. - Fluticasone-Salmeterol (ADVAIR DISKUS) 500-50 MCG/DOSE AEPB; Inhale 1 puff 2 (two) times daily into the lungs.  Dispense: 60 each; Refill: 12 - predniSONE (DELTASONE) 10 MG tablet; 3 tabs PO daily x 2 days then, 2 tabs daily x 2 days then 1 tab x 2 days then 1/2 tab x 2 days.  Dispense: 13 tablet; Refill: 1  Patient was given the opportunity to ask questions.  Patient verbalized understanding of the plan and was able to repeat key elements of the plan.   No orders of the defined types were placed in this encounter.    Requested Prescriptions    No prescriptions requested or ordered in this encounter    F/u in 6 wks Jonah Blueeborah Martell Mcfadyen, MD, Jerrel IvoryFACP

## 2017-02-14 NOTE — Patient Instructions (Addendum)
Your asthma is not well controlled.  Increase Advair to 500/50 1 puff twice a day.

## 2017-03-09 MED FILL — !VENTOLIN HFA INHALER: 108 (90 BAS | 25 days supply | Qty: 18 | Fill #3

## 2017-03-09 MED FILL — **ADVAIR 500/50 DISKUS: 500-50 MCG | 7 days supply | Qty: 14 | Fill #0

## 2017-03-09 MED FILL — ?PREDNISONE 10 MG TABLET: 10 | 8 days supply | Qty: 13 | Fill #1

## 2017-03-09 MED FILL — ?MONTELUKAST SOD 10MG TAB: 10 | 30 days supply | Qty: 30 | Fill #2

## 2017-03-16 MED FILL — ADVAIR 500/50 DISKUS: 500-50 | 30 days supply | Qty: 60 | Fill #1

## 2017-03-22 MED FILL — !VENTOLIN HFA INHALER: 108 (90 BAS | 25 days supply | Qty: 18 | Fill #4

## 2017-04-06 ENCOUNTER — Other Ambulatory Visit: Payer: Self-pay | Admitting: Internal Medicine

## 2017-04-06 DIAGNOSIS — J4550 Severe persistent asthma, uncomplicated: Secondary | ICD-10-CM

## 2017-04-06 MED FILL — predniSONE 10 MG TABS: 10 | 13 days supply | Qty: 13 | Fill #0

## 2017-04-11 MED FILL — ADVAIR 500/50 DISKUS: 500-50 | 30 days supply | Qty: 60 | Fill #2

## 2017-04-11 MED FILL — VENTOLIN HFA 90 MCG INHALER: 108 (90 BAS | 25 days supply | Qty: 18 | Fill #5

## 2017-04-11 MED FILL — MONTELUKAST SOD 10 MG TAB: 10 | 30 days supply | Qty: 30 | Fill #3

## 2017-04-11 MED FILL — FLUTICASONE PROP 50 MCG SPR: 50 | 30 days supply | Qty: 16 | Fill #4

## 2017-04-25 ENCOUNTER — Emergency Department (HOSPITAL_COMMUNITY): Payer: Managed Care, Other (non HMO)

## 2017-04-25 ENCOUNTER — Emergency Department (HOSPITAL_COMMUNITY)
Admission: EM | Admit: 2017-04-25 | Discharge: 2017-04-26 | Disposition: A | Discharge status: Home / Self Care | Payer: Managed Care, Other (non HMO) | Attending: Emergency Medicine | Admitting: Emergency Medicine

## 2017-04-25 ENCOUNTER — Other Ambulatory Visit: Payer: Self-pay

## 2017-04-25 ENCOUNTER — Encounter (HOSPITAL_COMMUNITY): Payer: Self-pay | Admitting: Emergency Medicine

## 2017-04-25 DIAGNOSIS — R000 Tachycardia, unspecified: Secondary | ICD-10-CM | POA: Diagnosis not present

## 2017-04-25 DIAGNOSIS — R05 Cough: Secondary | ICD-10-CM | POA: Diagnosis not present

## 2017-04-25 DIAGNOSIS — Z79899 Other long term (current) drug therapy: Secondary | ICD-10-CM | POA: Diagnosis not present

## 2017-04-25 DIAGNOSIS — Z87891 Personal history of nicotine dependence: Secondary | ICD-10-CM | POA: Insufficient documentation

## 2017-04-25 DIAGNOSIS — J029 Acute pharyngitis, unspecified: Secondary | ICD-10-CM | POA: Diagnosis not present

## 2017-04-25 DIAGNOSIS — J4521 Mild intermittent asthma with (acute) exacerbation: Secondary | ICD-10-CM | POA: Insufficient documentation

## 2017-04-25 DIAGNOSIS — R1110 Vomiting, unspecified: Secondary | ICD-10-CM | POA: Insufficient documentation

## 2017-04-25 DIAGNOSIS — M7918 Myalgia, other site: Secondary | ICD-10-CM | POA: Insufficient documentation

## 2017-04-25 DIAGNOSIS — R509 Fever, unspecified: Secondary | ICD-10-CM | POA: Diagnosis not present

## 2017-04-25 DIAGNOSIS — R0602 Shortness of breath: Secondary | ICD-10-CM | POA: Diagnosis present

## 2017-04-25 MED ORDER — ALBUTEROL SULFATE (2.5 MG/3ML) 0.083% IN NEBU
5.0000 mg | INHALATION_SOLUTION | Freq: Once | RESPIRATORY_TRACT | Status: AC
  Administered 2017-04-25: 5 mg via RESPIRATORY_TRACT
  Filled 2017-04-25: qty 6

## 2017-04-25 MED ORDER — METHYLPREDNISOLONE SODIUM SUCC 125 MG IJ SOLR
125.0000 mg | Freq: Once | INTRAMUSCULAR | Status: AC
  Administered 2017-04-26: 125 mg via INTRAVENOUS
  Filled 2017-04-25: qty 2

## 2017-04-25 MED ORDER — ONDANSETRON HCL 4 MG/2ML IJ SOLN
4.0000 mg | Freq: Once | INTRAMUSCULAR | Status: AC
  Administered 2017-04-26: 4 mg via INTRAVENOUS
  Filled 2017-04-25: qty 2

## 2017-04-25 MED ORDER — FENTANYL CITRATE (PF) 100 MCG/2ML IJ SOLN
50.0000 ug | Freq: Once | INTRAMUSCULAR | Status: AC
  Administered 2017-04-26: 50 ug via INTRAVENOUS
  Filled 2017-04-25: qty 2

## 2017-04-25 MED ORDER — SODIUM CHLORIDE 0.9 % IV BOLUS (SEPSIS)
1000.0000 mL | Freq: Once | INTRAVENOUS | Status: AC
  Administered 2017-04-26: 1000 mL via INTRAVENOUS

## 2017-04-25 MED ORDER — IPRATROPIUM BROMIDE 0.02 % IN SOLN
1.0000 mg | Freq: Once | RESPIRATORY_TRACT | Status: AC
  Administered 2017-04-25: 1 mg via RESPIRATORY_TRACT
  Filled 2017-04-25: qty 5

## 2017-04-25 MED ORDER — ALBUTEROL (5 MG/ML) CONTINUOUS INHALATION SOLN
15.0000 mg/h | INHALATION_SOLUTION | Freq: Once | RESPIRATORY_TRACT | Status: AC
  Administered 2017-04-25: 15 mg/h via RESPIRATORY_TRACT
  Filled 2017-04-25: qty 20

## 2017-04-25 NOTE — ED Triage Notes (Signed)
Pt has asthma and started having shortness of breath 3 days ago that got worse today around 11am  Pt unable to speak full sentences

## 2017-04-25 NOTE — ED Provider Notes (Signed)
TIME SEEN: 11:21 PM  CHIEF COMPLAINT: Shortness of breath  HPI: Patient is a 32 year old female with history of asthma who presents emergency department with complaints of shortness of breath and wheezing.  States symptoms started several days ago.  Has had subjective fever and dry cough.  Also reports sore throat.  He did not have an influenza vaccination this year.  No known sick contacts.  Has had posttussive emesis.  No diarrhea.  Reports that she is hurting all over from working so hard to breathe.  She has been admitted before for asthma exacerbations but never intubated.  ROS: Level 5 caveat secondary to respiratory distress  PAST MEDICAL HISTORY/PAST SURGICAL HISTORY:  Past Medical History:  Diagnosis Date  . Asthma    seasonal  . Seasonal allergies   . Shortness of breath   . Tobacco abuse     MEDICATIONS:  Prior to Admission medications   Medication Sig Start Date End Date Taking? Authorizing Provider  albuterol (VENTOLIN HFA) 108 (90 Base) MCG/ACT inhaler INHALE 2 PUFFS INTO THE LUNGS EVERY 6 HOURS AS NEEDED FOR WHEEZING OR SHORTNESS OF BREATH. 01/05/17  Yes [name], MD  fluticasone (FLONASE) 50 MCG/ACT nasal spray Place 2 sprays into both nostrils daily. 08/08/16  Yes Langeland, Dawn T, MD  Fluticasone-Salmeterol (ADVAIR DISKUS) 500-50 MCG/DOSE AEPB Inhale 1 puff 2 (two) times daily into the lungs. 02/14/17  Yes [name], MD  ipratropium-albuterol (DUONEB) 0.5-2.5 (3) MG/3ML SOLN Take 3 mLs by nebulization every 4 (four) hours as needed. For sob. 08/08/16  Yes Langeland, Dawn T, MD  montelukast (SINGULAIR) 10 MG tablet Take 1 tablet (10 mg total) by mouth at bedtime. 01/05/17  Yes [name], MD  Phenylephrine-DM-GG-APAP (MUCINEX FAST-MAX) 5-10-200-325 MG CAPS Take 1 tablet by mouth every 4 (four) hours as needed.   Yes [provider]  Phenylephrine-DM-GG-APAP (THERAFLU EXPRESSMAX SEV CLD/FL) 5-10-200-325 MG TABS Take 1-2 tablets by mouth  every 4 (four) hours as needed.   Yes [provider]  predniSONE (DELTASONE) 10 MG tablet 3 TABS PO DAILY X 2 DAYS THEN, 2 TABS DAILY X 2 DAYS THEN 1 TAB X 2 DAYS THEN 1/2 TAB X 2 DAYS. Patient not taking: No sig reported 04/06/17   [name], MD  diphenhydrAMINE (BENADRYL) 25 MG tablet Take 25 mg by mouth every 6 (six) hours as needed. For allergies  10/25/11  [provider]    ALLERGIES:  No Known Allergies  SOCIAL HISTORY:  Social History   Tobacco Use  . Smoking status: Former Smoker    Types: Cigarettes    Last attempt to quit: 03/28/2013    Years since quitting: 4.0  . Smokeless tobacco: Never Used  . Tobacco comment: hx of 1 pk every 3 days  Substance Use Topics  . Alcohol use: Yes    Alcohol/week: 0.0 oz    Comment: daily glass of wine    FAMILY HISTORY: Family History  Problem Relation Age of Onset  . Asthma Mother   . Breast cancer Mother 53       s/p surgery and radiation  . Other Mother        hx of hysterectomy for bleeding/blood clotting in her 54s  . Asthma Father   . Diabetes type II Paternal Aunt   . Prostate cancer Other        maternal great uncle (MGM's brother) d. later age    EXAM: BP 136/75 (BP Location: Right Arm)   Pulse Marland Kitchen)  113   Temp 98.8 F (37.1 C) (Oral)   Resp 16   LMP 04/24/2017   SpO2 96%  CONSTITUTIONAL: Alert and oriented and responds appropriately to questions intermittently but appears very anxious secondary to respiratory distress, tearful, afebrile HEAD: Normocephalic EYES: Conjunctivae clear, pupils appear equal, EOMI ENT: normal nose; moist mucous membranes;  pharyngeal erythema but no petechiae, no tonsillar hypertrophy or exudate, no uvular deviation, no unilateral swelling, no trismus or drooling, no muffled voice, normal phonation, no stridor, no dental caries present, no drainable dental abscess noted, no Ludwig's angina, tongue sits flat in the bottom of the mouth, no angioedema, no facial  erythema or warmth, no facial swelling; no pain with movement of the neck. NECK: Supple, no meningismus, no nuchal rigidity, no LAD  CARD: Regular and tachycardic; S1 and S2 appreciated; no murmurs, no clicks, no rubs, no gallops RESP: Diminished aeration at her bases, diffuse scattered expiratory wheezes, no rhonchi or rales, and respiratory distress, speaking short sentences, no hypoxia ABD/GI: Normal bowel sounds; non-distended; soft, non-tender, no rebound, no guarding, no peritoneal signs, no hepatosplenomegaly BACK:  The back appears normal and is non-tender to palpation, there is no CVA tenderness EXT: Normal ROM in all joints; non-tender to palpation; no edema; normal capillary refill; no cyanosis, no calf tenderness or swelling    SKIN: Normal color for age and race; warm; no rash NEURO: Moves all extremities equally PSYCH: The patient's mood and manner are appropriate. Grooming and personal hygiene are appropriate.  MEDICAL DECISION MAKING: Patient here with asthma exacerbation.  Also reports fever, cough, sore throat.  No pneumonia, pneumothorax or edema.  Will obtain strep and flu swab.  Will obtain labs.  Will give Solu-Medrol, albuterol, Atrovent and reassess.  Will give fentanyl for pain control.  Will give IV fluids.  ED PROGRESS: Patient reports feeling much better.  She was able to ambulate oxygen saturation between 97 and 100%.  Patient would like discharge home.  Flu swab negative.  Strep test negative.  Chest x-ray shows no infiltrate or edema.  Labs unremarkable.  Lungs are clear to auscultation.  Still mildly tachycardic but has received multiple rounds of albuterol here.  Patient was offered admission initially but she declines.  States she is feeling much better would like discharge.  She has a PCP for follow-up.  She has a nebulizer machine and plenty of medication for.  Will discharge with albuterol inhaler and prednisone burst.   At this time, I do not feel there is any  life-threatening condition present. I have reviewed and discussed all results (EKG, imaging, lab, urine as appropriate) and exam findings with patient/family. I have reviewed nursing notes and appropriate previous records.  I feel the patient is safe to be discharged home without further emergent workup and can continue workup as an outpatient as needed. Discussed usual and customary return precautions. Patient/family verbalize understanding and are comfortable with this plan.  Outpatient follow-up has been provided if needed. All questions have been answered.    CRITICAL CARE Performed by: Rochele RaringKristen Ward   Total critical care time: 45 minutes  Critical care time was exclusive of separately billable procedures and treating other patients.  Critical care was necessary to treat or prevent imminent or life-threatening deterioration.  Critical care was time spent personally by me on the following activities: development of treatment plan with patient and/or surrogate as well as nursing, discussions with consultants, evaluation of patient's response to treatment, examination of patient, obtaining history from patient or  surrogate, ordering and performing treatments and interventions, ordering and review of laboratory studies, ordering and review of radiographic studies, pulse oximetry and re-evaluation of patient's condition.      Ward, Layla Maw, DO 04/26/17 [phone]

## 2017-04-26 LAB — CBC WITH DIFFERENTIAL/PLATELET
Basophils Absolute: 0.1 10*3/uL (ref 0.0–0.1)
Basophils Relative: 1 %
EOS ABS: 0.3 10*3/uL (ref 0.0–0.7)
Eosinophils Relative: 3 %
HEMATOCRIT: 40 % (ref 36.0–46.0)
Hemoglobin: 14.1 g/dL (ref 12.0–15.0)
LYMPHS ABS: 2.2 10*3/uL (ref 0.7–4.0)
Lymphocytes Relative: 22 %
MCH: 34.3 pg — AB (ref 26.0–34.0)
MCHC: 35.3 g/dL (ref 30.0–36.0)
MCV: 97.3 fL (ref 78.0–100.0)
MONO ABS: 1.3 10*3/uL — AB (ref 0.1–1.0)
Monocytes Relative: 13 %
NEUTROS ABS: 6.2 10*3/uL (ref 1.7–7.7)
Neutrophils Relative %: 61 %
Platelets: 221 10*3/uL (ref 150–400)
RBC: 4.11 MIL/uL (ref 3.87–5.11)
RDW: 12.5 % (ref 11.5–15.5)
WBC: 9.9 10*3/uL (ref 4.0–10.5)

## 2017-04-26 LAB — INFLUENZA PANEL BY PCR (TYPE A & B)
INFLBPCR: NEGATIVE
Influenza A By PCR: NEGATIVE

## 2017-04-26 LAB — BASIC METABOLIC PANEL
Anion gap: 9 (ref 5–15)
BUN: 10 mg/dL (ref 6–20)
CALCIUM: 9 mg/dL (ref 8.9–10.3)
CHLORIDE: 104 mmol/L (ref 101–111)
CO2: 26 mmol/L (ref 22–32)
CREATININE: 0.63 mg/dL (ref 0.44–1.00)
GFR calc Af Amer: >60 mL/min (ref >60)
GFR calc non Af Amer: >60 mL/min (ref >60)
Glucose, Bld: 94 mg/dL (ref 65–99)
Potassium: 3.1 mmol/L — ABNORMAL LOW (ref 3.5–5.1)
Sodium: 139 mmol/L (ref 135–145)

## 2017-04-26 LAB — I-STAT BETA HCG BLOOD, ED (MC, WL, AP ONLY): I-stat hCG, quantitative: <5 m[IU]/mL (ref <5)

## 2017-04-26 LAB — RAPID STREP SCREEN (MED CTR MEBANE ONLY): Streptococcus, Group A Screen (Direct): NEGATIVE

## 2017-04-26 MED ORDER — IPRATROPIUM BROMIDE 0.02 % IN SOLN
0.5000 mg | Freq: Once | RESPIRATORY_TRACT | Status: AC
  Administered 2017-04-26: 0.5 mg via RESPIRATORY_TRACT
  Filled 2017-04-26: qty 2.5

## 2017-04-26 MED ORDER — PREDNISONE 20 MG PO TABS: 60.0000 mg | ORAL_TABLET | Freq: Every day | ORAL | 0 refills | Status: DC

## 2017-04-26 MED ORDER — ALBUTEROL SULFATE (2.5 MG/3ML) 0.083% IN NEBU
5.0000 mg | INHALATION_SOLUTION | Freq: Once | RESPIRATORY_TRACT | Status: AC
  Administered 2017-04-26: 5 mg via RESPIRATORY_TRACT
  Filled 2017-04-26: qty 6

## 2017-04-26 MED ORDER — ALBUTEROL SULFATE HFA 108 (90 BASE) MCG/ACT IN AERS
2.0000 | INHALATION_SPRAY | Freq: Once | RESPIRATORY_TRACT | Status: AC
  Administered 2017-04-26: 2 via RESPIRATORY_TRACT
  Filled 2017-04-26: qty 6.7

## 2017-04-26 NOTE — ED Notes (Signed)
Patient's 02 remained between 97-100 while ambulating.

## 2017-04-28 ENCOUNTER — Ambulatory Visit: Payer: Managed Care, Other (non HMO) | Attending: Family Medicine | Admitting: Family Medicine

## 2017-04-28 ENCOUNTER — Encounter: Payer: Self-pay | Admitting: Family Medicine

## 2017-04-28 VITALS — BP 110/73 | HR 97 | Temp 98.3°F | Ht 63.0 in | Wt 175.8 lb

## 2017-04-28 DIAGNOSIS — J45909 Unspecified asthma, uncomplicated: Secondary | ICD-10-CM | POA: Diagnosis present

## 2017-04-28 DIAGNOSIS — F419 Anxiety disorder, unspecified: Secondary | ICD-10-CM | POA: Insufficient documentation

## 2017-04-28 DIAGNOSIS — Z7951 Long term (current) use of inhaled steroids: Secondary | ICD-10-CM | POA: Diagnosis not present

## 2017-04-28 DIAGNOSIS — J4541 Moderate persistent asthma with (acute) exacerbation: Secondary | ICD-10-CM | POA: Diagnosis not present

## 2017-04-28 DIAGNOSIS — R079 Chest pain, unspecified: Secondary | ICD-10-CM | POA: Insufficient documentation

## 2017-04-28 DIAGNOSIS — R109 Unspecified abdominal pain: Secondary | ICD-10-CM | POA: Diagnosis not present

## 2017-04-28 DIAGNOSIS — R05 Cough: Secondary | ICD-10-CM | POA: Diagnosis present

## 2017-04-28 DIAGNOSIS — J040 Acute laryngitis: Secondary | ICD-10-CM | POA: Diagnosis not present

## 2017-04-28 DIAGNOSIS — Z79899 Other long term (current) drug therapy: Secondary | ICD-10-CM | POA: Insufficient documentation

## 2017-04-28 LAB — CULTURE, GROUP A STREP (THRC)

## 2017-04-28 MED ORDER — HYDROCODONE-HOMATROPINE 5-1.5 MG/5ML PO SYRP: 5.0000 mL | ORAL_SOLUTION | Freq: Three times a day (TID) | ORAL | 0 refills | Status: DC | PRN

## 2017-04-28 MED ORDER — AZITHROMYCIN 250 MG PO TABS: ORAL_TABLET | ORAL | 0 refills | Status: DC

## 2017-04-28 MED ORDER — CETIRIZINE HCL 10 MG PO TABS: 10.0000 mg | ORAL_TABLET | Freq: Every day | ORAL | 1 refills | Status: DC

## 2017-04-28 MED FILL — CETIRIZINE HCL 10 MG TABS: 10 | 30 days supply | Qty: 30 | Fill #0

## 2017-04-28 MED FILL — AZITHROMYCIN 250 MG TABLET: 250 | 5 days supply | Qty: 6 | Fill #0

## 2017-04-28 NOTE — Progress Notes (Signed)
Subjective:  Patient ID: Dawn Robertson, female    DOB: 16-Sep-1985  Age: 32 y.o. MRN: [id number]  CC: Hospitalization Follow-up and Asthma   HPI Dawn Robertson is a 32 year old female with a history of asthma, anxiety who presents today for a follow-up from the ED where she was seen on 04/25/17 for asthma exacerbation. Group A strep was negative, influenza A and B were negative, chest x-ray revealed mild hyperinflation, no active cardiopulmonary disease, labs were negative for leukocytosis. She received nebulizer treatments, IV Solu-Medrol and IV fluids after which she was discharged on prednisone.  She presents today complaining of cough productive of whitish sputum, dyspnea, sore throat.  He also has chest pains and abdominal pains whenever she coughs and has lost her voice.  She has been unable to work due to her symptoms. She informs me that at the time of discharge from the ED she felt better but symptoms have  worsen since then;  Oxygen saturation is 97% in the clinic on room air.  She denies fevers.  Past Medical History:  Diagnosis Date  . Asthma    seasonal  . Seasonal allergies   . Shortness of breath   . Tobacco abuse     History reviewed. No pertinent surgical history.  No Known Allergies   Outpatient Medications Prior to Visit  Medication Sig Dispense Refill  . albuterol (VENTOLIN HFA) 108 (90 Base) MCG/ACT inhaler INHALE 2 PUFFS INTO THE LUNGS EVERY 6 HOURS AS NEEDED FOR WHEEZING OR SHORTNESS OF BREATH. 54 Inhaler 6  . fluticasone (FLONASE) 50 MCG/ACT nasal spray Place 2 sprays into both nostrils daily. 16 g 6  . Fluticasone-Salmeterol (ADVAIR DISKUS) 500-50 MCG/DOSE AEPB Inhale 1 puff 2 (two) times daily into the lungs. 60 each 12  . ipratropium-albuterol (DUONEB) 0.5-2.5 (3) MG/3ML SOLN Take 3 mLs by nebulization every 4 (four) hours as needed. For sob. 360 mL 2  . montelukast (SINGULAIR) 10 MG tablet Take 1 tablet (10 mg total) by mouth at bedtime. 30  tablet 6  . predniSONE (DELTASONE) 20 MG tablet Take 3 tablets (60 mg total) by mouth daily. 15 tablet 0  . Phenylephrine-DM-GG-APAP (MUCINEX FAST-MAX) 5-10-200-325 MG CAPS Take 1 tablet by mouth every 4 (four) hours as needed.    Marland Kitchen Phenylephrine-DM-GG-APAP (THERAFLU EXPRESSMAX SEV CLD/FL) 5-10-200-325 MG TABS Take 1-2 tablets by mouth every 4 (four) hours as needed.     No facility-administered medications prior to visit.     ROS Review of Systems  Constitutional: Negative for activity change, appetite change and fatigue.  HENT: Positive for sore throat and voice change. Negative for congestion and sinus pressure.   Eyes: Negative for visual disturbance.  Respiratory: Positive for cough, shortness of breath and wheezing. Negative for chest tightness.   Cardiovascular: Negative for chest pain and palpitations.  Gastrointestinal: Negative for abdominal distention, abdominal pain and constipation.  Endocrine: Negative for polydipsia.  Genitourinary: Negative for dysuria and frequency.  Musculoskeletal: Negative for arthralgias and back pain.  Skin: Negative for rash.  Neurological: Negative for tremors, light-headedness and numbness.  Hematological: Does not bruise/bleed easily.  Psychiatric/Behavioral: Negative for agitation and behavioral problems.    Objective:  BP 110/73   Pulse 97   Temp 98.3 F (36.8 C) (Oral)   Ht 5\' 3"  (1.6 m)   Wt 175 lb 12.8 oz (79.7 kg)   LMP 04/24/2017   SpO2 97%   BMI 31.14 kg/m   BP/Weight 04/28/2017 04/26/2017 02/14/2017  Systolic BP 110 117 116  Diastolic BP 73 76 74  Wt. (Lbs) 175.8 - 175  BMI 31.14 - 31      Physical Exam  Constitutional: She is oriented to person, place, and time. She appears well-developed and well-nourished.  Cardiovascular: Normal rate, normal heart sounds and intact distal pulses.  No murmur heard. Pulmonary/Chest: Effort normal and breath sounds normal. She has no wheezes. She has no rales. She exhibits no  tenderness.  Abdominal: Soft. Bowel sounds are normal. She exhibits no distension and no mass. There is no tenderness.  Musculoskeletal: Normal range of motion.  Neurological: She is alert and oriented to person, place, and time.  Skin: Skin is warm and dry.     Assessment & Plan:   1. Moderate persistent asthma with exacerbation Oxygen saturation is stable at 97% on room air Currently not doing well despite prednisone 60 mg daily and use of nebulizers and inhalers We will add on an antibiotic - HYDROcodone-homatropine (HYCODAN) 5-1.5 MG/5ML syrup; Take 5 mLs by mouth every 8 (eight) hours as needed for cough.  Dispense: 120 mL; Refill: 0 - azithromycin (ZITHROMAX) 250 MG tablet; Take 2 tabs (500 mg) on day 1 then 1 tab (250 mg) on days 2-5  Dispense: 6 tablet; Refill: 0  2. Laryngitis, acute Advised that this is self-limiting Increase fluid intake - cetirizine (ZYRTEC) 10 MG tablet; Take 1 tablet (10 mg total) by mouth daily.  Dispense: 30 tablet; Refill: 1   Meds ordered this encounter  Medications  . HYDROcodone-homatropine (HYCODAN) 5-1.5 MG/5ML syrup    Sig: Take 5 mLs by mouth every 8 (eight) hours as needed for cough.    Dispense:  120 mL    Refill:  0  . azithromycin (ZITHROMAX) 250 MG tablet    Sig: Take 2 tabs (500 mg) on day 1 then 1 tab (250 mg) on days 2-5    Dispense:  6 tablet    Refill:  0  . cetirizine (ZYRTEC) 10 MG tablet    Sig: Take 1 tablet (10 mg total) by mouth daily.    Dispense:  30 tablet    Refill:  1    Follow-up: Return in about 3 weeks (around 05/19/2017) for Follow-up of asthma exacerbation with PCP-Dr. Laural BenesJohnson.   Hoy RegisterEnobong Shadee Montoya MD

## 2017-04-28 NOTE — Patient Instructions (Signed)
Laryngitis Laryngitis is swelling (inflammation) of your vocal cords. This causes hoarseness, coughing, loss of voice, sore throat, or a dry throat. When your vocal cords are inflamed, your voice sounds different. Laryngitis can be temporary (acute) or long-term (chronic). Most cases of acute laryngitis improve with time. Chronic laryngitis is laryngitis that lasts for more than three weeks. Follow these instructions at home:  Drink enough fluid to keep your pee (urine) clear or pale yellow.  Breathe in moist air. Use a humidifier if you live in a dry climate.  Take medicines only as told by your doctor.  Do not smoke cigarettes or electronic cigarettes. If you need help quitting, ask your doctor.  Talk as little as possible. Also avoid whispering, which can cause vocal strain.  Write instead of talking. Do this until your voice is back to normal. Contact a doctor if:  You have a fever.  Your pain is worse.  You have trouble swallowing. Get help right away if:  You cough up blood.  You have trouble breathing. This information is not intended to replace advice given to you by your health care provider. Make sure you discuss any questions you have with your health care provider. Document Released: 03/03/2011 Document Revised: 08/20/2015 Document Reviewed: 08/27/2013 Elsevier Interactive Patient Education  2018 [employer] Inc[employer]  

## 2017-04-28 NOTE — Progress Notes (Signed)
Patient is having pain in Abdomen and throat.

## 2017-05-19 ENCOUNTER — Ambulatory Visit: Payer: Managed Care, Other (non HMO) | Attending: Internal Medicine | Admitting: Internal Medicine

## 2017-05-19 ENCOUNTER — Encounter: Payer: Self-pay | Admitting: Internal Medicine

## 2017-05-19 VITALS — BP 110/78 | HR 90 | Temp 99.1°F | Resp 16 | Ht 62.0 in | Wt 185.6 lb

## 2017-05-19 DIAGNOSIS — E669 Obesity, unspecified: Secondary | ICD-10-CM | POA: Insufficient documentation

## 2017-05-19 DIAGNOSIS — Z87891 Personal history of nicotine dependence: Secondary | ICD-10-CM | POA: Diagnosis not present

## 2017-05-19 DIAGNOSIS — Z803 Family history of malignant neoplasm of breast: Secondary | ICD-10-CM | POA: Insufficient documentation

## 2017-05-19 DIAGNOSIS — J4540 Moderate persistent asthma, uncomplicated: Secondary | ICD-10-CM

## 2017-05-19 DIAGNOSIS — E6609 Other obesity due to excess calories: Secondary | ICD-10-CM

## 2017-05-19 DIAGNOSIS — Z79899 Other long term (current) drug therapy: Secondary | ICD-10-CM | POA: Insufficient documentation

## 2017-05-19 DIAGNOSIS — Z6833 Body mass index (BMI) 33.0-33.9, adult: Secondary | ICD-10-CM | POA: Insufficient documentation

## 2017-05-19 MED ORDER — PREDNISONE 10 MG PO TABS: ORAL_TABLET | ORAL | 0 refills | Status: DC

## 2017-05-19 MED FILL — MONTELUKAST SOD 10 MG TAB: 10 | 30 days supply | Qty: 30 | Fill #4

## 2017-05-19 MED FILL — ADVAIR 500/50 DISKUS: 500-50 | 30 days supply | Qty: 60 | Fill #3

## 2017-05-19 MED FILL — predniSONE 10 MG TABS: 10 | 6 days supply | Qty: 12 | Fill #0

## 2017-05-19 MED FILL — VENTOLIN HFA 90 MCG INHALER: 108 (90 BAS | 25 days supply | Qty: 18 | Fill #6

## 2017-05-19 MED FILL — FLUTICASONE PROP 50 MCG SPR: 50 | 30 days supply | Qty: 16 | Fill #5

## 2017-05-19 NOTE — Patient Instructions (Signed)
Check your peak flow daily, if not at least 3-4 times a week. Try to avoid running out of Advair.  Remember that this is your maintenance inhaler.  Try to cook at least twice a week with enough leftovers to last 2 days so that you are not eating out every day Try to drink more water.  If you must drink sodas I would recommend drinking the diet sodas.

## 2017-05-19 NOTE — Progress Notes (Signed)
Patient ID: Dawn Robertson, female    DOB: 1985-10-16  MRN: [id number]  CC: Asthma and Weight Gain   Subjective: Dawn Robertson is a 32 y.o. female who presents for chronic ds management.  Last saw me 01/2017. Her concerns today include:  Hx of Asthma moderate persistent, obesity, former tob dep, fhx of breast CA in mother, seasonal allergies and menorrhagia  Saw Dr. Alvis Lemmings 3 wks ago post ER for asthma flare.  1. Asthma:  Since last visit, she feels breathing is better Using Proventil 2-3 x a day Out of Advair for 3-4 days.  Dose increased on last visit.  Using It BID.  Having to use mneb once at nights over past 3-4 days since being out of Advair.  Using Singulair and Flonase daily Checking PEAK flow once a wk.  Average has been 300s.  This a.m 300.  She will be going to [place] for 4-5 days next week.  She is nervous because the cold air usually cause a flare of her asthma.  2.  BP elevated today.  She has completed Prednisone  3.  Obesity:  Gained 10 lbs since last visi 3 wks ago.  She was on a Prednisone taper. -eating out a lot due to work schedule. Loves fried foods.  Some veggies "if I can get my hands on it." Drinking regular sodas. Not getting in exercise outside of work Patient Active Problem List   Diagnosis Date Noted  . Former smoker 11/14/2016  . Obesity (BMI 30.0-34.9) 11/14/2016  . Anxiety state 11/14/2016  . Positive depression screening 11/14/2016  . Family history of breast cancer in mother 09/18/2015  . Moderate persistent asthma      Current Outpatient Medications on File Prior to Visit  Medication Sig Dispense Refill  . albuterol (VENTOLIN HFA) 108 (90 Base) MCG/ACT inhaler INHALE 2 PUFFS INTO THE LUNGS EVERY 6 HOURS AS NEEDED FOR WHEEZING OR SHORTNESS OF BREATH. 54 Inhaler 6  . cetirizine (ZYRTEC) 10 MG tablet Take 1 tablet (10 mg total) by mouth daily. 30 tablet 1  . fluticasone (FLONASE) 50 MCG/ACT nasal spray Place 2 sprays into both  nostrils daily. 16 g 6  . Fluticasone-Salmeterol (ADVAIR DISKUS) 500-50 MCG/DOSE AEPB Inhale 1 puff 2 (two) times daily into the lungs. 60 each 12  . ipratropium-albuterol (DUONEB) 0.5-2.5 (3) MG/3ML SOLN Take 3 mLs by nebulization every 4 (four) hours as needed. For sob. 360 mL 2  . montelukast (SINGULAIR) 10 MG tablet Take 1 tablet (10 mg total) by mouth at bedtime. 30 tablet 6  . Phenylephrine-DM-GG-APAP (MUCINEX FAST-MAX) 5-10-200-325 MG CAPS Take 1 tablet by mouth every 4 (four) hours as needed.    Marland Kitchen Phenylephrine-DM-GG-APAP (THERAFLU EXPRESSMAX SEV CLD/FL) 5-10-200-325 MG TABS Take 1-2 tablets by mouth every 4 (four) hours as needed.     No current facility-administered medications on file prior to visit.     No Known Allergies  Social History   Socioeconomic History  . Marital status: Single    Spouse name: Not on file  . Number of children: Not on file  . Years of education: Not on file  . Highest education level: Not on file  Social Needs  . Financial resource strain: Not on file  . Food insecurity - worry: Not on file  . Food insecurity - inability: Not on file  . Transportation needs - medical: Not on file  . Transportation needs - non-medical: Not on file  Occupational History  . Not on file  Tobacco Use  . Smoking status: Former Smoker    Types: Cigarettes    Last attempt to quit: 03/28/2013    Years since quitting: 4.1  . Smokeless tobacco: Never Used  . Tobacco comment: hx of 1 pk every 3 days  Substance and Sexual Activity  . Alcohol use: Yes    Alcohol/week: 0.0 oz    Comment: daily glass of wine  . Drug use: No  . Sexual activity: No    Birth control/protection: None  Other Topics Concern  . Not on file  Social History Narrative  . Not on file    Family History  Problem Relation Age of Onset  . Asthma Mother   . Breast cancer Mother [id number]       s/p surgery and radiation  . Other Mother        hx of hysterectomy for bleeding/blood clotting in her 8920s    . Asthma Father   . Diabetes type II Paternal Aunt   . Prostate cancer Other        maternal great uncle (MGM's brother) d. later age    No past surgical history on file.  ROS: Review of Systems Negative except as stated above PHYSICAL EXAM: BP 110/78   Pulse 90   Temp 99.1 F (37.3 C) (Oral)   Resp 16   Ht 5\' 2"  (1.575 m)   Wt 185 lb 9.6 oz (84.2 kg)   LMP 04/24/2017   SpO2 100%   BMI 33.95 kg/m   Wt Readings from Last 3 Encounters:  05/19/17 185 lb 9.6 oz (84.2 kg)  04/28/17 175 lb 12.8 oz (79.7 kg)  02/14/17 175 lb (79.4 kg)    Physical Exam  General appearance - alert, well appearing, and in no distress Mental status - alert, oriented to person, place, and time, normal mood, behavior, speech, dress, motor activity, and thought processes Neck - supple, no significant adenopathy Chest - clear to auscultation, no wheezes, rales or rhonchi, symmetric air entry Heart - normal rate, regular rhythm, normal S1, S2, no murmurs, rubs, clicks or gallops   ASSESSMENT AND PLAN: 1. Moderate persistent asthma without complication -Avoid running out of Advair -Given rxn for Prednisone to take with her to [place] to use if needed.  Take Neb machine with her also. Check peak flow at least 3-4 times a wk - predniSONE (DELTASONE) 10 MG tablet; 3 tabs PO daily x 2 days, then 2 tabs x 2 days then 1 tab x 2 days  Dispense: 12 tablet; Refill: 0  2. Class 1 obesity due to excess calories without serious comorbidity with body mass index (BMI) of 33.0 to 33.9 in adult -wgh previously maintained bw 170-175 lbs.  Recent Prednisone taper may have contributed to 10 lb gain.  Encouraged her to make better food choices and try to cook more.  Be aware of calories in drinks.  Drink more water.   Patient was given the opportunity to ask questions.  Patient verbalized understanding of the plan and was able to repeat key elements of the plan.   No orders of the defined types were placed in  this encounter.    Requested Prescriptions   Signed Prescriptions Disp Refills  . predniSONE (DELTASONE) 10 MG tablet 12 tablet 0    Sig: 3 tabs PO daily x 2 days, then 2 tabs x 2 days then 1 tab x 2 days    No Follow-up on file.  Jonah Blueeborah Johnson, MD, FACP

## 2017-06-06 ENCOUNTER — Telehealth: Payer: Self-pay | Admitting: Internal Medicine

## 2017-06-06 DIAGNOSIS — J4541 Moderate persistent asthma with (acute) exacerbation: Secondary | ICD-10-CM

## 2017-06-06 NOTE — Telephone Encounter (Signed)
Pt called to say that she has been suffering of shortness of breath and she started using her  -predniSONE (DELTASONE) 10 MG tablet  On Monday because she ran out of her -albuterol (VENTOLIN HFA) 108 (90 Base) MCG/ACT inhaler [facility]

## 2017-06-06 NOTE — Telephone Encounter (Signed)
Pt was called to address concern of SOB and medication: Her concerns are that she has been little bit of SOB once coming back from out of town: [place]. Since Monday of last week she has been using inhaler more normal than usual. No dyspnea or trouble breathing.  She states she was told not to take prednisone unless she really needed so she began to take Prednisone on this Sunday. She states she take medications everyday. She is on the 3rd  day prednisone.  She has used her DUONEB nebulizer treatment which helps. She isn't coughing a lot now. Just concerned about SOB.  Per pharmacy, she would need a script written with different sig. In order for insurance to cover since she needs refill early.  Please advise

## 2017-06-06 NOTE — Telephone Encounter (Signed)
Pt called back but nurse was unable to speak with her,she would like a call back .Please follow up

## 2017-06-06 NOTE — Telephone Encounter (Signed)
Attempt to call patient for more information on SOB and medication.  Unable to reach patient. Left message on voicemail for patient to call back.   Pt may need OV if needing to use inhaler more frequently.  She would need a new Rx with different sig in order for insurance to cover, otherwise, MDI is $72.00. Does she have PRN Prednisone? If taken as directed, along with inhalers, regimen would have been completed Prednisone by March 2. She picked up medication February 27th.

## 2017-06-07 MED ORDER — ALBUTEROL SULFATE HFA 108 (90 BASE) MCG/ACT IN AERS: INHALATION_SPRAY | RESPIRATORY_TRACT | 6 refills | Status: DC

## 2017-06-07 MED FILL — ALBUTEROL SULFATE HFA 108 (: 108 (90 BAS | 16 days supply | Qty: 18 | Fill #0

## 2017-06-07 NOTE — Telephone Encounter (Signed)
Would you make appointment for patient per Dr. Laural BenesJohnson note.

## 2017-06-07 NOTE — Telephone Encounter (Signed)
Pt aware medication at the pharmacy. She is unable to come in for appointment with Dr. Laural BenesJohnson this week due to work.

## 2017-06-07 NOTE — Addendum Note (Signed)
Addended by: Jonah BlueJOHNSON, DEBORAH B on: 06/07/2017 11:24 AM   Modules accepted: Orders

## 2017-06-14 MED FILL — ADVAIR 500/50 DISKUS: 500-50 | 30 days supply | Qty: 60 | Fill #4

## 2017-06-30 MED FILL — MONTELUKAST SOD 10 MG TAB: 10 | 30 days supply | Qty: 30 | Fill #5

## 2017-06-30 MED FILL — ALBUTEROL SULFATE HFA 108 (: 108 (90 BAS | 16 days supply | Qty: 18 | Fill #1

## 2017-07-18 ENCOUNTER — Encounter (HOSPITAL_COMMUNITY): Payer: Self-pay | Admitting: Emergency Medicine

## 2017-07-18 ENCOUNTER — Ambulatory Visit (HOSPITAL_COMMUNITY)
Admission: EM | Admit: 2017-07-18 | Discharge: 2017-07-18 | Disposition: A | Discharge status: Home / Self Care | Payer: Managed Care, Other (non HMO) | Attending: Family Medicine | Admitting: Family Medicine

## 2017-07-18 ENCOUNTER — Other Ambulatory Visit: Payer: Self-pay

## 2017-07-18 ENCOUNTER — Telehealth (HOSPITAL_COMMUNITY): Payer: Self-pay | Admitting: Emergency Medicine

## 2017-07-18 ENCOUNTER — Ambulatory Visit (INDEPENDENT_AMBULATORY_CARE_PROVIDER_SITE_OTHER): Payer: Managed Care, Other (non HMO)

## 2017-07-18 DIAGNOSIS — J4521 Mild intermittent asthma with (acute) exacerbation: Secondary | ICD-10-CM

## 2017-07-18 DIAGNOSIS — J040 Acute laryngitis: Secondary | ICD-10-CM | POA: Diagnosis not present

## 2017-07-18 DIAGNOSIS — B9789 Other viral agents as the cause of diseases classified elsewhere: Secondary | ICD-10-CM | POA: Diagnosis not present

## 2017-07-18 MED ORDER — PREDNISONE 50 MG PO TABS: 50.0000 mg | ORAL_TABLET | Freq: Every day | ORAL | 0 refills | Status: AC

## 2017-07-18 MED ORDER — PREDNISONE 50 MG PO TABS: 50.0000 mg | ORAL_TABLET | Freq: Every day | ORAL | 0 refills | Status: DC

## 2017-07-18 MED ORDER — IPRATROPIUM-ALBUTEROL 0.5-2.5 (3) MG/3ML IN SOLN
RESPIRATORY_TRACT | Status: AC
  Filled 2017-07-18: qty 3

## 2017-07-18 MED ORDER — IPRATROPIUM-ALBUTEROL 0.5-2.5 (3) MG/3ML IN SOLN
3.0000 mL | Freq: Once | RESPIRATORY_TRACT | Status: AC
  Administered 2017-07-18: 3 mL via RESPIRATORY_TRACT

## 2017-07-18 MED FILL — ALBUTEROL SULFATE HFA 108 (: 108 (90 BAS | 16 days supply | Qty: 18 | Fill #2

## 2017-07-18 MED FILL — predniSONE 10 MG TABS: 10 | 5 days supply | Qty: 25 | Fill #0

## 2017-07-18 NOTE — Discharge Instructions (Addendum)
Duoneb given in office Continue current medications for asthma as prescribed Prednisone prescribed.  Take as directed and to completion Get rest and drink fluids Use OTC ibuprofen or tylenol as needed for symptomatic relief of fever or pain Follow up with PCP next week or sooner for reevaluation Return here or go to ER if you have any new or worsening symptoms (shortness of breath, difficulty breathing, accessory muscle use, nasal flaring, unable to speak in full sentences, etc...)

## 2017-07-18 NOTE — ED Provider Notes (Signed)
[facility]    CSN: [id number] Arrival date & time: 07/18/17  1404     History   Chief Complaint Chief Complaint  Patient presents with  . Cough    HPI Dawn Robertson is a 32 y.o. female.   complains of cough for three days.  She denies a precipitating event, but attributes her symptoms to season changes.  She describes her cough as constantand productive with yellow sputum.  Patient has tried albuterol and breathing treatments (duoneb) at home with temporary relief. She denies aggravating symptoms.  She reports previous symptoms in the past and was diagnosed with laryngitis and treated with a medication.  She is unsure what the medication was, but her symptoms resolved.    Patients past medical history is significant for asthma.  She has been using her albuterol inhaler frequently, as well as doing duoneb breathing treatments at home.  She takes advair and montelukast daily.       Past Medical History:  Diagnosis Date  . Asthma    seasonal  . Seasonal allergies   . Shortness of breath   . Tobacco abuse     Patient Active Problem List   Diagnosis Date Noted  . Former smoker 11/14/2016  . Obesity (BMI 30.0-34.9) 11/14/2016  . Anxiety state 11/14/2016  . Positive depression screening 11/14/2016  . Family history of breast cancer in mother 09/18/2015  . Moderate persistent asthma     History reviewed. No pertinent surgical history.  OB History   None      Home Medications    Prior to Admission medications   Medication Sig Start Date End Date Taking? Authorizing Provider  albuterol (VENTOLIN HFA) 108 (90 Base) MCG/ACT inhaler INHALE 2 PUFFS INTO THE LUNGS EVERY 4-6 HOURS AS NEEDED FOR WHEEZING OR SHORTNESS OF BREATH. 06/07/17   [name], MD  cetirizine (ZYRTEC) 10 MG tablet Take 1 tablet (10 mg total) by mouth daily. 04/28/17   Hoy Register, MD  fluticasone (FLONASE) 50 MCG/ACT nasal spray Place 2 sprays into both nostrils daily.  08/08/16   Langeland, Dawn T, MD  Fluticasone-Salmeterol (ADVAIR DISKUS) 500-50 MCG/DOSE AEPB Inhale 1 puff 2 (two) times daily into the lungs. 02/14/17   [name], MD  ipratropium-albuterol (DUONEB) 0.5-2.5 (3) MG/3ML SOLN Take 3 mLs by nebulization every 4 (four) hours as needed. For sob. 08/08/16   Langeland, Dawn T, MD  montelukast (SINGULAIR) 10 MG tablet Take 1 tablet (10 mg total) by mouth at bedtime. 01/05/17   [name], MD  Phenylephrine-DM-GG-APAP (MUCINEX FAST-MAX) 5-10-200-325 MG CAPS Take 1 tablet by mouth every 4 (four) hours as needed.    [provider]  Phenylephrine-DM-GG-APAP (THERAFLU EXPRESSMAX SEV CLD/FL) 5-10-200-325 MG TABS Take 1-2 tablets by mouth every 4 (four) hours as needed.    [provider]  predniSONE (DELTASONE) 50 MG tablet Take 1 tablet (50 mg total) by mouth daily for 5 days. 07/18/17 07/23/17  Rennis Harding, PA-C    Family History Family History  Problem Relation Age of Onset  . Asthma Mother   . Breast cancer Mother 56       s/p surgery and radiation  . Other Mother        hx of hysterectomy for bleeding/blood clotting in her 72s  . Asthma Father   . Diabetes type II Paternal Aunt   . Prostate cancer Other        maternal great uncle (MGM's brother) d. later age  Social History Social History   Tobacco Use  . Smoking status: Former Smoker    Types: Cigarettes    Last attempt to quit: 03/28/2013    Years since quitting: 4.3  . Smokeless tobacco: Never Used  . Tobacco comment: hx of 1 pk every 3 days  Substance Use Topics  . Alcohol use: Yes    Alcohol/week: 0.0 oz    Comment: daily glass of wine  . Drug use: No     Allergies   Patient has no known allergies.   Review of Systems Review of Systems  Constitutional: Positive for fever (100.4 in office today). Negative for chills and fatigue.  HENT: Positive for congestion, rhinorrhea and sneezing. Negative for postnasal drip, sinus pressure, sinus  pain and sore throat.   Respiratory: Positive for cough and wheezing. Negative for shortness of breath.   Cardiovascular: Negative for chest pain.  Gastrointestinal: Negative for abdominal pain, constipation, diarrhea, nausea and vomiting.  Genitourinary: Negative for difficulty urinating and dysuria.     Physical Exam Triage Vital Signs ED Triage Vitals  Enc Vitals Group     BP      Pulse      Resp      Temp      Temp src      SpO2      Weight      Height      Head Circumference      Peak Flow      Pain Score      Pain Loc      Pain Edu?      Excl. in GC?    No data found.  Updated Vital Signs BP (!) 139/91 (BP Location: Left Arm)   Pulse (!) 107   Temp (!) 100.4 F (38 C) (Oral)   LMP 07/15/2017   SpO2 100%     Physical Exam  Constitutional: She is oriented to person, place, and time. She appears well-developed and well-nourished. No distress.  Does not appear to be in respiratory distress.  Speaking in full sentences.    HENT:  Head: Normocephalic and atraumatic.  Right Ear: External ear normal.  Left Ear: External ear normal.  Mouth/Throat: Oropharynx is clear and moist. No oropharyngeal exudate.  Nares with mild clear rhinorrhea.  Nares erythematous Nontender to sinus palpation  Eyes: Pupils are equal, round, and reactive to light. Conjunctivae and EOM are normal.  Neck: Neck supple.  Cardiovascular: Normal rate, regular rhythm and normal heart sounds. Exam reveals no friction rub.  No murmur heard. Pulmonary/Chest: Effort normal. No respiratory distress. She has wheezes (Diffuse bilateral wheezing heard throughout.  Improved following duoneb treatment in office.). She has no rales.  Abdominal: Soft. Bowel sounds are normal. There is no tenderness.  Musculoskeletal:  Ambulates from chair to exam table without difficulty  Lymphadenopathy:    She has no cervical adenopathy.  Neurological: She is alert and oriented to person, place, and time.  Skin: Skin is  warm and dry. Capillary refill takes 2 to 3 seconds. She is not diaphoretic.  Psychiatric: She has a normal mood and affect. Her behavior is normal. Judgment and thought content normal.     UC Treatments / Results  Labs (all labs ordered are listed, but only abnormal results are displayed) Labs Reviewed - No data to display  EKG None Radiology Dg Chest 2 View  Result Date: 07/18/2017 CLINICAL DATA:  Cough and wheeze for 3 days. EXAM: CHEST - 2 VIEW COMPARISON:  04/25/2017  FINDINGS: Normal heart size and mediastinal contours. No acute infiltrate or edema. No effusion or pneumothorax. No acute osseous findings. IMPRESSION: Negative chest. Electronically Signed   By: Marnee Spring M.D.   On: 07/18/2017 16:10    Negative chest x-ray. No obvious infiltrates or consolidations appreciated.  I have reviewed the x-rays myself.  I agree with the radiologist interpretation Procedures Procedures (including critical care time)  Medications Ordered in UC Medications  ipratropium-albuterol (DUONEB) 0.5-2.5 (3) MG/3ML nebulizer solution 3 mL (3 mLs Nebulization Given 07/18/17 1537)     Initial Impression / Assessment and Plan / UC Course  I have reviewed the triage vital signs and the nursing notes.  Pertinent labs & imaging results that were available during my care of the patient were reviewed by me and considered in my medical decision making (see chart for details).     Patients hx, symptoms, and physical exam suggestive of viral URI or allergies with asthma exacerbation.  Given duoneb treatment in office.  CXR ordered and did not show any obvious infiltrates or consolidations.  Prescribed prednisone.  Will continue current asthma medications as prescribed.  Will follow up with PCP next week for reevaluation of asthma and symptoms.  Return and ER precautions given.    Final Clinical Impressions(s) / UC Diagnoses   Final diagnoses:  Intermittent asthma with acute exacerbation,  unspecified asthma severity  Acute viral laryngitis    ED Discharge Orders        Ordered    predniSONE (DELTASONE) 50 MG tablet  Daily     07/18/17 1537       Controlled Substance Prescriptions [place] Controlled Substance Registry consulted? Not Applicable   Rennis Harding, [name] 07/18/17 1614

## 2017-07-18 NOTE — ED Triage Notes (Signed)
C/o rhinitis, chest congestion with productive cough and hoarseness onset Friday

## 2017-07-26 MED FILL — ADVAIR 500/50 DISKUS: 500-50 | 30 days supply | Qty: 60 | Fill #5

## 2017-08-17 ENCOUNTER — Encounter: Payer: Self-pay | Admitting: Internal Medicine

## 2017-08-17 ENCOUNTER — Ambulatory Visit: Payer: Managed Care, Other (non HMO) | Attending: Internal Medicine | Admitting: Internal Medicine

## 2017-08-17 ENCOUNTER — Ambulatory Visit: Payer: Managed Care, Other (non HMO) | Admitting: Internal Medicine

## 2017-08-17 VITALS — BP 120/79 | HR 83 | Temp 99.3°F | Resp 16 | Wt 185.6 lb

## 2017-08-17 DIAGNOSIS — E669 Obesity, unspecified: Secondary | ICD-10-CM | POA: Diagnosis not present

## 2017-08-17 DIAGNOSIS — Z6834 Body mass index (BMI) 34.0-34.9, adult: Secondary | ICD-10-CM | POA: Insufficient documentation

## 2017-08-17 DIAGNOSIS — Z803 Family history of malignant neoplasm of breast: Secondary | ICD-10-CM | POA: Diagnosis not present

## 2017-08-17 DIAGNOSIS — Z7951 Long term (current) use of inhaled steroids: Secondary | ICD-10-CM | POA: Diagnosis not present

## 2017-08-17 DIAGNOSIS — Z79899 Other long term (current) drug therapy: Secondary | ICD-10-CM | POA: Diagnosis not present

## 2017-08-17 DIAGNOSIS — N920 Excessive and frequent menstruation with regular cycle: Secondary | ICD-10-CM | POA: Insufficient documentation

## 2017-08-17 DIAGNOSIS — J4540 Moderate persistent asthma, uncomplicated: Secondary | ICD-10-CM | POA: Diagnosis not present

## 2017-08-17 DIAGNOSIS — Z87891 Personal history of nicotine dependence: Secondary | ICD-10-CM | POA: Diagnosis not present

## 2017-08-17 DIAGNOSIS — F411 Generalized anxiety disorder: Secondary | ICD-10-CM | POA: Insufficient documentation

## 2017-08-17 DIAGNOSIS — J45909 Unspecified asthma, uncomplicated: Secondary | ICD-10-CM | POA: Diagnosis present

## 2017-08-17 NOTE — Progress Notes (Signed)
Patient ID: Dawn Robertson, female    DOB: Jan 16, 1986  MRN: [id number]  CC: Asthma   Subjective: Dawn Robertson is a 32 y.o. female who presents for chronic ds management Her concerns today include:  Hx ofAsthma moderate persistent,obesity,former tob dep, fhx of breast CA in mother, seasonal allergies and menorrhagia  Asthma:  Seen in UC about 1 mth ago for flare but has done well since then Compliant with Advair BID.  Takes Albuterol BID also.  Uses Duoneb about once a wk She has not taken Singulair in about 1 mth She has not been bad with the pollen Still free cig She checks be peak flow intermittently. Her baseline is between 300-350  Obesity:  Signed up for gym last wk.  Just started going this wk  Patient Active Problem List   Diagnosis Date Noted  . Former smoker 11/14/2016  . Obesity (BMI 30.0-34.9) 11/14/2016  . Anxiety state 11/14/2016  . Positive depression screening 11/14/2016  . Family history of breast cancer in mother 09/18/2015  . Moderate persistent asthma      Current Outpatient Medications on File Prior to Visit  Medication Sig Dispense Refill  . albuterol (VENTOLIN HFA) 108 (90 Base) MCG/ACT inhaler INHALE 2 PUFFS INTO THE LUNGS EVERY 4-6 HOURS AS NEEDED FOR WHEEZING OR SHORTNESS OF BREATH. 54 Inhaler 6  . cetirizine (ZYRTEC) 10 MG tablet Take 1 tablet (10 mg total) by mouth daily. 30 tablet 1  . fluticasone (FLONASE) 50 MCG/ACT nasal spray Place 2 sprays into both nostrils daily. 16 g 6  . Fluticasone-Salmeterol (ADVAIR DISKUS) 500-50 MCG/DOSE AEPB Inhale 1 puff 2 (two) times daily into the lungs. 60 each 12  . ipratropium-albuterol (DUONEB) 0.5-2.5 (3) MG/3ML SOLN Take 3 mLs by nebulization every 4 (four) hours as needed. For sob. 360 mL 2  . montelukast (SINGULAIR) 10 MG tablet Take 1 tablet (10 mg total) by mouth at bedtime. 30 tablet 6  . Phenylephrine-DM-GG-APAP (MUCINEX FAST-MAX) 5-10-200-325 MG CAPS Take 1 tablet by mouth every 4 (four)  hours as needed.     No current facility-administered medications on file prior to visit.     No Known Allergies  Social History   Socioeconomic History  . Marital status: Single    Spouse name: Not on file  . Number of children: Not on file  . Years of education: Not on file  . Highest education level: Not on file  Occupational History  . Not on file  Social Needs  . Financial resource strain: Not on file  . Food insecurity:    Worry: Not on file    Inability: Not on file  . Transportation needs:    Medical: Not on file    Non-medical: Not on file  Tobacco Use  . Smoking status: Former Smoker    Types: Cigarettes    Last attempt to quit: 03/28/2013    Years since quitting: 4.3  . Smokeless tobacco: Never Used  . Tobacco comment: hx of 1 pk every 3 days  Substance and Sexual Activity  . Alcohol use: Yes    Alcohol/week: 0.0 oz    Comment: daily glass of wine  . Drug use: No  . Sexual activity: Never    Birth control/protection: None  Lifestyle  . Physical activity:    Days per week: Not on file    Minutes per session: Not on file  . Stress: Not on file  Relationships  . Social connections:    Talks on phone:  Not on file    Gets together: Not on file    Attends religious service: Not on file    Active member of club or organization: Not on file    Attends meetings of clubs or organizations: Not on file    Relationship status: Not on file  . Intimate partner violence:    Fear of current or ex partner: Not on file    Emotionally abused: Not on file    Physically abused: Not on file    Forced sexual activity: Not on file  Other Topics Concern  . Not on file  Social History Narrative  . Not on file    Family History  Problem Relation Age of Onset  . Asthma Mother   . Breast cancer Mother 54       s/p surgery and radiation  . Other Mother        hx of hysterectomy for bleeding/blood clotting in her 56s  . Asthma Father   . Diabetes type II Paternal Aunt    . Prostate cancer Other        maternal great uncle (MGM's brother) d. later age    No past surgical history on file.  ROS: Review of Systems Negative except as above PHYSICAL EXAM: BP 120/79   Pulse 83   Temp 99.3 F (37.4 C) (Oral)   Resp 16   Wt 185 lb 9.6 oz (84.2 kg)   SpO2 96%   BMI 33.95 kg/m   Wt Readings from Last 3 Encounters:  08/17/17 185 lb 9.6 oz (84.2 kg)  05/19/17 185 lb 9.6 oz (84.2 kg)  04/28/17 175 lb 12.8 oz (79.7 kg)    Physical Exam  General appearance - alert, well appearing, and in no distress Mental status - alert, oriented to person, place, and time, normal mood, behavior, speech, dress, motor activity, and thought processes Chest - clear to auscultation, no wheezes, rales or rhonchi, symmetric air entry Heart - normal rate, regular rhythm, normal S1, S2, no murmurs, rubs, clicks or gallops Extremities - peripheral pulses normal, no pedal edema, no clubbing or cyanosis  ASSESSMENT AND PLAN: 1. Moderate persistent asthma without complication Continue Advair and albuterol.  2. Obesity (BMI 30.0-34.9) Commended her on joining a gym. Advised to use albuterol if needed at least about 15 minutes before exercise. Encourage healthy eating habits.  Patient was given the opportunity to ask questions.  Patient verbalized understanding of the plan and was able to repeat key elements of the plan.   No orders of the defined types were placed in this encounter.    Requested Prescriptions    No prescriptions requested or ordered in this encounter    Return in about 3 months (around 11/17/2017).  Jonah Blue, MD, FACP

## 2017-08-17 NOTE — Patient Instructions (Signed)
Use your Albuterol inhaler if needed 15 minutes before exercise.

## 2017-08-24 MED FILL — ADVAIR 500/50 DISKUS: 500-50 | 30 days supply | Qty: 60 | Fill #6

## 2017-08-24 MED FILL — ALBUTEROL SULFATE HFA 108 (: 108 (90 BAS | 16 days supply | Qty: 18 | Fill #3

## 2017-09-19 MED FILL — ADVAIR 500/50 DISKUS: 500-50 | 30 days supply | Qty: 60 | Fill #7

## 2017-09-19 MED FILL — ALBUTEROL SULFATE HFA 108 (: 108 (90 BAS | 16 days supply | Qty: 18 | Fill #4

## 2017-09-21 ENCOUNTER — Telehealth: Payer: Self-pay | Admitting: Internal Medicine

## 2017-09-21 MED ORDER — BUDESONIDE-FORMOTEROL FUMARATE 80-4.5 MCG/ACT IN AERO: 2.0000 | INHALATION_SPRAY | Freq: Two times a day (BID) | RESPIRATORY_TRACT | 11 refills | Status: DC

## 2017-09-21 NOTE — Telephone Encounter (Signed)
-----   Message from Particia LatherJay'A R Pollock, [place] sent at 09/20/2017  2:13 PM EDT ----- Regarding: Medication Management  Shayla from the pharmacy sent a skype message regarding pt   trying to help Dawn Robertson with the cost of her inhalers. She's paying $70 for her Advair. Shes tried almost everything but Symbicort i think. I was able to get Symbicort Free for her. Can you check with Dr. Laural BenesJohnson when you get a chance to see if she can send a new rx for Symbicort & have her try that inhaler?  if symbicort doesnt work out for her then she will just switch back to advair

## 2017-10-11 MED FILL — ALBUTEROL SULFATE HFA 108 (: 108 (90 BAS | 16 days supply | Qty: 18 | Fill #5

## 2017-10-24 MED FILL — SYMBICORT 80-4.5 MCG INH: 80-4.5 | 30 days supply | Qty: 10 | Fill #0

## 2017-11-02 MED FILL — ALBUTEROL SULFATE HFA 108 (: 108 (90 BAS | 16 days supply | Qty: 18 | Fill #6

## 2017-11-17 ENCOUNTER — Encounter: Payer: Self-pay | Admitting: Internal Medicine

## 2017-11-17 ENCOUNTER — Ambulatory Visit: Payer: Managed Care, Other (non HMO) | Attending: Internal Medicine | Admitting: Internal Medicine

## 2017-11-17 VITALS — BP 117/73 | HR 75 | Temp 99.1°F | Resp 16 | Wt 177.8 lb

## 2017-11-17 DIAGNOSIS — J069 Acute upper respiratory infection, unspecified: Secondary | ICD-10-CM | POA: Diagnosis not present

## 2017-11-17 DIAGNOSIS — Z6839 Body mass index (BMI) 39.0-39.9, adult: Secondary | ICD-10-CM | POA: Diagnosis not present

## 2017-11-17 DIAGNOSIS — N920 Excessive and frequent menstruation with regular cycle: Secondary | ICD-10-CM | POA: Diagnosis not present

## 2017-11-17 DIAGNOSIS — Z1331 Encounter for screening for depression: Secondary | ICD-10-CM | POA: Diagnosis not present

## 2017-11-17 DIAGNOSIS — Z803 Family history of malignant neoplasm of breast: Secondary | ICD-10-CM | POA: Diagnosis not present

## 2017-11-17 DIAGNOSIS — E669 Obesity, unspecified: Secondary | ICD-10-CM | POA: Diagnosis present

## 2017-11-17 DIAGNOSIS — J4541 Moderate persistent asthma with (acute) exacerbation: Secondary | ICD-10-CM | POA: Diagnosis not present

## 2017-11-17 DIAGNOSIS — J4540 Moderate persistent asthma, uncomplicated: Secondary | ICD-10-CM | POA: Diagnosis present

## 2017-11-17 DIAGNOSIS — Z87891 Personal history of nicotine dependence: Secondary | ICD-10-CM | POA: Diagnosis not present

## 2017-11-17 DIAGNOSIS — Z79899 Other long term (current) drug therapy: Secondary | ICD-10-CM | POA: Diagnosis not present

## 2017-11-17 MED ORDER — BENZONATATE 100 MG PO CAPS: 100.0000 mg | ORAL_CAPSULE | Freq: Two times a day (BID) | ORAL | 0 refills | Status: DC | PRN

## 2017-11-17 MED ORDER — IPRATROPIUM-ALBUTEROL 0.5-2.5 (3) MG/3ML IN SOLN
3.0000 mL | RESPIRATORY_TRACT | 2 refills | Status: DC | PRN
Start: 2017-11-17 — End: 2019-01-24

## 2017-11-17 MED ORDER — PREDNISONE 20 MG PO TABS: ORAL_TABLET | ORAL | 0 refills | Status: DC

## 2017-11-17 MED FILL — IPRAT-ALBUT 0.5-3(2.5) MG/3: 0.5-2.5 (3) | 20 days supply | Qty: 360 | Fill #0

## 2017-11-17 MED FILL — BENZONATATE 100 MG CAP: 100 | 10 days supply | Qty: 20 | Fill #0

## 2017-11-17 MED FILL — SYMBICORT 80-4.5 MCG INH: 80-4.5 | 30 days supply | Qty: 10 | Fill #1

## 2017-11-17 MED FILL — predniSONE 20 MG TABS: 20 | 7 days supply | Qty: 6 | Fill #0

## 2017-11-17 NOTE — Progress Notes (Signed)
Patient ID: Dawn Robertson, female    DOB: 03-01-1986  MRN: [id number]  CC: Follow-up   Subjective: Dawn Robertson is a 32 y.o. female who presents for chronic ds management. Her concerns today include:  Hx ofAsthma moderate persistent,obesity,former tob dep, fhx of breast CA in mother, seasonal allergies and menorrhagia  Obesity:  Was going to gym 3-4 x a wk for past few mths.  Stopped 3 wks ago because she had a cold. She has cut back on eating rice.  She has lost 8 pounds since last visit  Asthma:  Productive cough x 2 wks associated with nasal drainage.  Some increase SOB and wheezing. Having to use Albuterol MDI 3-4 x a day.  Needs RF on Duoneb No fever Using Symbicort consistently.  Took Singulair only once. She has remained tobacco free. Declines flu shot today.  Patient Active Problem List   Diagnosis Date Noted  . Former smoker 11/14/2016  . Obesity (BMI 30.0-34.9) 11/14/2016  . Anxiety state 11/14/2016  . Positive depression screening 11/14/2016  . Family history of breast cancer in mother 09/18/2015  . Moderate persistent asthma      Current Outpatient Medications on File Prior to Visit  Medication Sig Dispense Refill  . albuterol (VENTOLIN HFA) 108 (90 Base) MCG/ACT inhaler INHALE 2 PUFFS INTO THE LUNGS EVERY 4-6 HOURS AS NEEDED FOR WHEEZING OR SHORTNESS OF BREATH. 54 Inhaler 6  . budesonide-formoterol (SYMBICORT) 80-4.5 MCG/ACT inhaler Inhale 2 puffs into the lungs 2 (two) times daily. 1 Inhaler 11  . cetirizine (ZYRTEC) 10 MG tablet Take 1 tablet (10 mg total) by mouth daily. 30 tablet 1  . fluticasone (FLONASE) 50 MCG/ACT nasal spray Place 2 sprays into both nostrils daily. 16 g 6  . montelukast (SINGULAIR) 10 MG tablet Take 1 tablet (10 mg total) by mouth at bedtime. 30 tablet 6  . Phenylephrine-DM-GG-APAP (MUCINEX FAST-MAX) 5-10-200-325 MG CAPS Take 1 tablet by mouth every 4 (four) hours as needed.     No current facility-administered medications  on file prior to visit.     No Known Allergies  Social History   Socioeconomic History  . Marital status: Single    Spouse name: Not on file  . Number of children: Not on file  . Years of education: Not on file  . Highest education level: Not on file  Occupational History  . Not on file  Social Needs  . Financial resource strain: Not on file  . Food insecurity:    Worry: Not on file    Inability: Not on file  . Transportation needs:    Medical: Not on file    Non-medical: Not on file  Tobacco Use  . Smoking status: Former Smoker    Types: Cigarettes    Last attempt to quit: 03/28/2013    Years since quitting: 4.6  . Smokeless tobacco: Never Used  . Tobacco comment: hx of 1 pk every 3 days  Substance and Sexual Activity  . Alcohol use: Yes    Alcohol/week: 0.0 standard drinks    Comment: daily glass of wine  . Drug use: No  . Sexual activity: Never    Birth control/protection: None  Lifestyle  . Physical activity:    Days per week: Not on file    Minutes per session: Not on file  . Stress: Not on file  Relationships  . Social connections:    Talks on phone: Not on file    Gets together: Not on file  Attends religious service: Not on file    Active member of club or organization: Not on file    Attends meetings of clubs or organizations: Not on file    Relationship status: Not on file  . Intimate partner violence:    Fear of current or ex partner: Not on file    Emotionally abused: Not on file    Physically abused: Not on file    Forced sexual activity: Not on file  Other Topics Concern  . Not on file  Social History Narrative  . Not on file    Family History  Problem Relation Age of Onset  . Asthma Mother   . Breast cancer Mother 5731       s/p surgery and radiation  . Other Mother        hx of hysterectomy for bleeding/blood clotting in her 6520s  . Asthma Father   . Diabetes type II Paternal Aunt   . Prostate cancer Other        maternal great uncle  (MGM's brother) d. later age    No past surgical history on file.  ROS: Review of Systems Negative except as stated above PHYSICAL EXAM: BP 117/73   Pulse 75   Temp 99.1 F (37.3 C) (Oral)   Resp 16   Wt 177 lb 12.8 oz (80.6 kg)   SpO2 97%   BMI 32.52 kg/m   Wt Readings from Last 3 Encounters:  11/17/17 177 lb 12.8 oz (80.6 kg)  08/17/17 185 lb 9.6 oz (84.2 kg)  05/19/17 185 lb 9.6 oz (84.2 kg)   Physical Exam General appearance - alert, well appearing, and in no distress Mental status - normal mood, behavior, speech, dress, motor activity, and thought processes Nose -mild enlargement of nasal turbinates.  Turbinates appear inflamed Mouth - mucous membranes moist, pharynx normal without lesions Neck - supple, no significant adenopathy Chest -few scattered wheezes bilaterally.  No crackles or rhonchi.  Good air entry.  Heart - normal rate, regular rhythm, normal S1, S2, no murmurs, rubs, clicks or gallops Extremities - peripheral pulses normal, no pedal edema, no clubbing or cyanosis  ASSESSMENT AND PLAN: 1. Moderate persistent asthma with exacerbation Due to upper respiratory infection.  Patient declined flu shot today. - ipratropium-albuterol (DUONEB) 0.5-2.5 (3) MG/3ML SOLN; Take 3 mLs by nebulization every 4 (four) hours as needed. For sob.  Dispense: 360 mL; Refill: 2 - predniSONE (DELTASONE) 20 MG tablet; 1 tab PO daily x 4 days then 1/2 tab daily x 3 days.  Dispense: 6 tablet; Refill: 0 - benzonatate (TESSALON) 100 MG capsule; Take 1 capsule (100 mg total) by mouth 2 (two) times daily as needed for cough.  Dispense: 20 capsule; Refill: 0  2. Upper respiratory tract infection, unspecified type See #1 above  3. Obesity (BMI 30-39.9) Commended her on weight loss so far.  Encouraged her to keep up the good work.  She plans to get back to the gym once she gets over this upper respiratory infection.  Patient was given the opportunity to ask questions.  Patient verbalized  understanding of the plan and was able to repeat key elements of the plan.   No orders of the defined types were placed in this encounter.    Requested Prescriptions   Signed Prescriptions Disp Refills  . ipratropium-albuterol (DUONEB) 0.5-2.5 (3) MG/3ML SOLN 360 mL 2    Sig: Take 3 mLs by nebulization every 4 (four) hours as needed. For sob.  . predniSONE (DELTASONE)  20 MG tablet 6 tablet 0    Sig: 1 tab PO daily x 4 days then 1/2 tab daily x 3 days.  . benzonatate (TESSALON) 100 MG capsule 20 capsule 0    Sig: Take 1 capsule (100 mg total) by mouth 2 (two) times daily as needed for cough.    Return in about 3 months (around 02/17/2018).  Jonah Blue, MD, FACP

## 2017-12-01 MED FILL — ALBUTEROL SULFATE HFA 108 (: 108 (90 BAS | 16 days supply | Qty: 18 | Fill #7

## 2017-12-15 MED FILL — SYMBICORT 80-4.5 MCG INH: 80-4.5 | 30 days supply | Qty: 10 | Fill #2

## 2017-12-26 MED FILL — ALBUTEROL SULFATE HFA 108 (: 108 (90 BAS | 16 days supply | Qty: 18 | Fill #8

## 2018-01-11 MED FILL — SYMBICORT 80-4.5 MCG INH: 80-4.5 | 30 days supply | Qty: 10 | Fill #3

## 2018-01-18 MED FILL — ALBUTEROL SULFATE HFA 108 (: 108 (90 BAS | 16 days supply | Qty: 18 | Fill #9

## 2018-02-05 MED FILL — SYMBICORT 80-4.5 MCG INH: 80-4.5 | 30 days supply | Qty: 10 | Fill #4

## 2018-02-19 ENCOUNTER — Ambulatory Visit: Payer: 59 | Attending: Internal Medicine | Admitting: Internal Medicine

## 2018-02-19 ENCOUNTER — Encounter: Payer: Self-pay | Admitting: Internal Medicine

## 2018-02-19 VITALS — BP 117/81 | HR 87 | Temp 98.7°F | Resp 16 | Wt 176.6 lb

## 2018-02-19 DIAGNOSIS — Z7951 Long term (current) use of inhaled steroids: Secondary | ICD-10-CM | POA: Insufficient documentation

## 2018-02-19 DIAGNOSIS — J4540 Moderate persistent asthma, uncomplicated: Secondary | ICD-10-CM

## 2018-02-19 DIAGNOSIS — E669 Obesity, unspecified: Secondary | ICD-10-CM | POA: Diagnosis not present

## 2018-02-19 DIAGNOSIS — Z6834 Body mass index (BMI) 34.0-34.9, adult: Secondary | ICD-10-CM | POA: Insufficient documentation

## 2018-02-19 DIAGNOSIS — Z803 Family history of malignant neoplasm of breast: Secondary | ICD-10-CM | POA: Insufficient documentation

## 2018-02-19 DIAGNOSIS — Z79899 Other long term (current) drug therapy: Secondary | ICD-10-CM | POA: Insufficient documentation

## 2018-02-19 DIAGNOSIS — Z87891 Personal history of nicotine dependence: Secondary | ICD-10-CM | POA: Insufficient documentation

## 2018-02-19 MED ORDER — MONTELUKAST SODIUM 10 MG PO TABS: 10.0000 mg | ORAL_TABLET | Freq: Every day | ORAL | 6 refills | Status: DC

## 2018-02-19 MED ORDER — BUDESONIDE-FORMOTEROL FUMARATE 160-4.5 MCG/ACT IN AERO: 2.0000 | INHALATION_SPRAY | Freq: Two times a day (BID) | RESPIRATORY_TRACT | 3 refills | Status: DC

## 2018-02-19 MED ORDER — PREDNISONE 20 MG PO TABS: ORAL_TABLET | ORAL | 1 refills | Status: DC

## 2018-02-19 MED FILL — ALBUTEROL SULFATE HFA 108 (: 108 (90 BAS | 16 days supply | Qty: 18 | Fill #10

## 2018-02-19 NOTE — Progress Notes (Signed)
Patient ID: Dawn Robertson, female    DOB: 08/13/1985  MRN: [id number]  CC: Asthma   Subjective: Dawn Robertson is a 32 y.o. female who presents for chronic ds management Her concerns today include:  Hx ofAsthma moderate persistent,obesity,former tob dep, fhx of breast CA in mother, seasonal allergies and menorrhagia  Asthma:  Reports frequent use of Albuterol inhaler 2-4 x a day.  Uses neb machine twice a day in a.m and bedtime.  She sometimes wakes up through the night and has to do a nebulizer treatment.  Using Symbicort twice a day as prescribed.  Endorses some increased cough and shortness of breath. -asthma ususally worse in colder mths She does not use her peak flow meter. She remains tobacco free.  Obesity: Reports good eating habits.  She has not been to the gym in a while. Patient Active Problem List   Diagnosis Date Noted  . Former smoker 11/14/2016  . Obesity (BMI 30.0-34.9) 11/14/2016  . Anxiety state 11/14/2016  . Positive depression screening 11/14/2016  . Family history of breast cancer in mother 09/18/2015  . Moderate persistent asthma      Current Outpatient Medications on File Prior to Visit  Medication Sig Dispense Refill  . albuterol (VENTOLIN HFA) 108 (90 Base) MCG/ACT inhaler INHALE 2 PUFFS INTO THE LUNGS EVERY 4-6 HOURS AS NEEDED FOR WHEEZING OR SHORTNESS OF BREATH. 54 Inhaler 6  . benzonatate (TESSALON) 100 MG capsule Take 1 capsule (100 mg total) by mouth 2 (two) times daily as needed for cough. 20 capsule 0  . cetirizine (ZYRTEC) 10 MG tablet Take 1 tablet (10 mg total) by mouth daily. 30 tablet 1  . fluticasone (FLONASE) 50 MCG/ACT nasal spray Place 2 sprays into both nostrils daily. 16 g 6  . ipratropium-albuterol (DUONEB) 0.5-2.5 (3) MG/3ML SOLN Take 3 mLs by nebulization every 4 (four) hours as needed. For sob. 360 mL 2  . Phenylephrine-DM-GG-APAP (MUCINEX FAST-MAX) 5-10-200-325 MG CAPS Take 1 tablet by mouth every 4 (four) hours as  needed.     No current facility-administered medications on file prior to visit.     No Known Allergies  Social History   Socioeconomic History  . Marital status: Single    Spouse name: Not on file  . Number of children: Not on file  . Years of education: Not on file  . Highest education level: Not on file  Occupational History  . Not on file  Social Needs  . Financial resource strain: Not on file  . Food insecurity:    Worry: Not on file    Inability: Not on file  . Transportation needs:    Medical: Not on file    Non-medical: Not on file  Tobacco Use  . Smoking status: Former Smoker    Types: Cigarettes    Last attempt to quit: 03/28/2013    Years since quitting: 4.9  . Smokeless tobacco: Never Used  . Tobacco comment: hx of 1 pk every 3 days  Substance and Sexual Activity  . Alcohol use: Yes    Alcohol/week: 0.0 standard drinks    Comment: daily glass of wine  . Drug use: No  . Sexual activity: Never    Birth control/protection: None  Lifestyle  . Physical activity:    Days per week: Not on file    Minutes per session: Not on file  . Stress: Not on file  Relationships  . Social connections:    Talks on phone: Not on file  Gets together: Not on file    Attends religious service: Not on file    Active member of club or organization: Not on file    Attends meetings of clubs or organizations: Not on file    Relationship status: Not on file  . Intimate partner violence:    Fear of current or ex partner: Not on file    Emotionally abused: Not on file    Physically abused: Not on file    Forced sexual activity: Not on file  Other Topics Concern  . Not on file  Social History Narrative  . Not on file    Family History  Problem Relation Age of Onset  . Asthma Mother   . Breast cancer Mother 81       s/p surgery and radiation  . Other Mother        hx of hysterectomy for bleeding/blood clotting in her 61s  . Asthma Father   . Diabetes type II Paternal  Aunt   . Prostate cancer Other        maternal great uncle (MGM's brother) d. later age    No past surgical history on file.  ROS: Review of Systems Negative except as above. PHYSICAL EXAM: BP 117/81   Pulse 87   Temp 98.7 F (37.1 C) (Oral)   Resp 16   Wt 176 lb 9.6 oz (80.1 kg)   SpO2 95%   BMI 32.30 kg/m   Wt Readings from Last 3 Encounters:  02/19/18 176 lb 9.6 oz (80.1 kg)  11/17/17 177 lb 12.8 oz (80.6 kg)  08/17/17 185 lb 9.6 oz (84.2 kg)    Physical Exam  General appearance - alert, well appearing, and in no distress Mental status - normal mood, behavior, speech, dress, motor activity, and thought processes Neck - supple, no significant adenopathy Chest -mild diffuse wheezing with good air entry.  No crackles or rhonchi is heard. Heart - normal rate, regular rhythm, normal S1, S2, no murmurs, rubs, clicks or gallops Extremities - peripheral pulses normal, no pedal edema, no clubbing or cyanosis  ASSESSMENT AND PLAN:  1. Moderate persistent asthma without complication Advised patient to use her peak flow meter every morning to get an idea of what her baseline is. Increase Symbicort to 160/4.5. Refill Singulair which she has been out of for several months. Given short course of prednisone. She declines flu shot again. - montelukast (SINGULAIR) 10 MG tablet; Take 1 tablet (10 mg total) by mouth at bedtime.  Dispense: 30 tablet; Refill: 6 - budesonide-formoterol (SYMBICORT) 160-4.5 MCG/ACT inhaler; Inhale 2 puffs into the lungs 2 (two) times daily.  Dispense: 1 Inhaler; Refill: 3 - predniSONE (DELTASONE) 20 MG tablet; 1 tab PO daily x 4 days then 1/2 tab daily x 3 days.  Dispense: 6 tablet; Refill: 1  2. Obesity (BMI 30.0-34.9) Continue healthy eating habits.  Encouraged her to try to get in some regular exercise at least 3 to 4 days a week for 30 to 40 minutes once asthma is under better control.  She may have to use albuterol inhaler before exercising.  Patient  was given the opportunity to ask questions.  Patient verbalized understanding of the plan and was able to repeat key elements of the plan.   No orders of the defined types were placed in this encounter.    Requested Prescriptions   Signed Prescriptions Disp Refills  . montelukast (SINGULAIR) 10 MG tablet 30 tablet 6    Sig: Take 1 tablet (10  mg total) by mouth at bedtime.  . budesonide-formoterol (SYMBICORT) 160-4.5 MCG/ACT inhaler 1 Inhaler 3    Sig: Inhale 2 puffs into the lungs 2 (two) times daily.  . predniSONE (DELTASONE) 20 MG tablet 6 tablet 1    Sig: 1 tab PO daily x 4 days then 1/2 tab daily x 3 days.    Return in about 3 months (around 05/22/2018).  Jonah Blue, MD, FACP

## 2018-02-19 NOTE — Patient Instructions (Signed)
We have increased the dose on the Symbicort from 80-1 60.  He will continue to use it twice a day. Restart Singulair. Try to use your peak flow meter every morning when you first wake up.

## 2018-02-20 MED FILL — predniSONE 20 MG TABS: 20 | 7 days supply | Qty: 6 | Fill #0

## 2018-02-20 MED FILL — SYMBICORT 160-4.5 MCG INH: 160-4.5 | 30 days supply | Qty: 10 | Fill #0

## 2018-02-20 MED FILL — MONTELUKAST SOD 10 MG TAB: 10 | 30 days supply | Qty: 30 | Fill #0

## 2018-03-22 MED FILL — ALBUTEROL SULFATE HFA 108 (: 108 (90 BAS | 16 days supply | Qty: 18 | Fill #11

## 2018-03-22 MED FILL — predniSONE 20 MG TABS: 20 | 7 days supply | Qty: 6 | Fill #1

## 2018-03-22 MED FILL — SYMBICORT 160-4.5 MCG INH: 160-4.5 | 30 days supply | Qty: 6 | Fill #1

## 2018-04-19 MED FILL — ALBUTEROL SULFATE HFA 108 (: 108 (90 BAS | 16 days supply | Qty: 18 | Fill #12

## 2018-04-19 MED FILL — SYMBICORT 160-4.5 MCG INH: 160-4.5 | 30 days supply | Qty: 10 | Fill #2

## 2018-05-16 MED FILL — ALBUTEROL SULFATE HFA 108 (: 108 (90 BAS | 16 days supply | Qty: 18 | Fill #13

## 2018-05-16 MED FILL — SYMBICORT 160-4.5 MCG INH: 160-4.5 | 30 days supply | Qty: 10 | Fill #3

## 2018-05-22 ENCOUNTER — Encounter: Payer: Self-pay | Admitting: Internal Medicine

## 2018-05-22 ENCOUNTER — Ambulatory Visit: Payer: 59 | Attending: Internal Medicine | Admitting: Internal Medicine

## 2018-05-22 VITALS — BP 116/82 | HR 85 | Temp 98.4°F | Resp 16 | Wt 183.4 lb

## 2018-05-22 DIAGNOSIS — Z2821 Immunization not carried out because of patient refusal: Secondary | ICD-10-CM

## 2018-05-22 DIAGNOSIS — J4540 Moderate persistent asthma, uncomplicated: Secondary | ICD-10-CM

## 2018-05-22 DIAGNOSIS — J4541 Moderate persistent asthma with (acute) exacerbation: Secondary | ICD-10-CM

## 2018-05-22 DIAGNOSIS — Z6834 Body mass index (BMI) 34.0-34.9, adult: Secondary | ICD-10-CM

## 2018-05-22 MED ORDER — PREDNISONE 20 MG PO TABS: ORAL_TABLET | ORAL | 1 refills | Status: DC

## 2018-05-22 MED ORDER — BUDESONIDE-FORMOTEROL FUMARATE 160-4.5 MCG/ACT IN AERO: 2.0000 | INHALATION_SPRAY | Freq: Two times a day (BID) | RESPIRATORY_TRACT | 6 refills | Status: DC

## 2018-05-22 MED ORDER — ALBUTEROL SULFATE HFA 108 (90 BASE) MCG/ACT IN AERS: INHALATION_SPRAY | RESPIRATORY_TRACT | 6 refills | Status: DC

## 2018-05-22 NOTE — Progress Notes (Signed)
Patient ID: Dawn Robertson, female    DOB: 1986/01/20  MRN: [id number]  CC: Asthma   Subjective: Dawn Robertson is a 33 y.o. female who presents for chronic ds management Her concerns today include:  Hx ofAsthma moderate persistent,obesity,former tob dep, fhx of breast CA in mother, seasonal allergies and menorrhagia  Asthma: Has a peak flow meter but has not been using it as recommended on last visit.  C/o intermittent flares which seems to be related to the weather fluctuations over the past few months.  Worse in rainy, cold or humid weather.  Woke up this morning with a lot of coughing and shortness of breath.  She is requesting a prescription for prednisone.  She has had to use her albuterol inhaler about 10 times for today.  Usually she uses it twice a day when she takes her Symbicort flare today.  Her nebulizer machine is very old and no longer works.  She would like to get a new one.  She is still not interested in getting the flu shot.  Patient Active Problem List   Diagnosis Date Noted  . Former smoker 11/14/2016  . Obesity (BMI 30.0-34.9) 11/14/2016  . Anxiety state 11/14/2016  . Positive depression screening 11/14/2016  . Family history of breast cancer in mother 09/18/2015  . Moderate persistent asthma      Current Outpatient Medications on File Prior to Visit  Medication Sig Dispense Refill  . cetirizine (ZYRTEC) 10 MG tablet Take 1 tablet (10 mg total) by mouth daily. (Patient not taking: Reported on 05/22/2018) 30 tablet 1  . fluticasone (FLONASE) 50 MCG/ACT nasal spray Place 2 sprays into both nostrils daily. 16 g 6  . ipratropium-albuterol (DUONEB) 0.5-2.5 (3) MG/3ML SOLN Take 3 mLs by nebulization every 4 (four) hours as needed. For sob. 360 mL 2  . montelukast (SINGULAIR) 10 MG tablet Take 1 tablet (10 mg total) by mouth at bedtime. 30 tablet 6  . Phenylephrine-DM-GG-APAP (MUCINEX FAST-MAX) 5-10-200-325 MG CAPS Take 1 tablet by mouth every 4 (four) hours  as needed.     No current facility-administered medications on file prior to visit.     No Known Allergies  Social History   Socioeconomic History  . Marital status: Single    Spouse name: Not on file  . Number of children: Not on file  . Years of education: Not on file  . Highest education level: Not on file  Occupational History  . Not on file  Social Needs  . Financial resource strain: Not on file  . Food insecurity:    Worry: Not on file    Inability: Not on file  . Transportation needs:    Medical: Not on file    Non-medical: Not on file  Tobacco Use  . Smoking status: Former Smoker    Types: Cigarettes    Last attempt to quit: 03/28/2013    Years since quitting: 5.1  . Smokeless tobacco: Never Used  . Tobacco comment: hx of 1 pk every 3 days  Substance and Sexual Activity  . Alcohol use: Yes    Alcohol/week: 0.0 standard drinks    Comment: daily glass of wine  . Drug use: No  . Sexual activity: Never    Birth control/protection: None  Lifestyle  . Physical activity:    Days per week: Not on file    Minutes per session: Not on file  . Stress: Not on file  Relationships  . Social connections:    Talks  on phone: Not on file    Gets together: Not on file    Attends religious service: Not on file    Active member of club or organization: Not on file    Attends meetings of clubs or organizations: Not on file    Relationship status: Not on file  . Intimate partner violence:    Fear of current or ex partner: Not on file    Emotionally abused: Not on file    Physically abused: Not on file    Forced sexual activity: Not on file  Other Topics Concern  . Not on file  Social History Narrative  . Not on file    Family History  Problem Relation Age of Onset  . Asthma Mother   . Breast cancer Mother 6831       s/p surgery and radiation  . Other Mother        hx of hysterectomy for bleeding/blood clotting in her 6020s  . Asthma Father   . Diabetes type II  Paternal Aunt   . Prostate cancer Other        maternal great uncle (MGM's brother) d. later age    No past surgical history on file.  ROS: Review of Systems Negative except as stated above  PHYSICAL EXAM: BP 116/82   Pulse 85   Temp 98.4 F (36.9 C) (Oral)   Resp 16   Wt 183 lb 6.4 oz (83.2 kg)   SpO2 95%   BMI 33.54 kg/m   Wt Readings from Last 3 Encounters:  05/22/18 183 lb 6.4 oz (83.2 kg)  02/19/18 176 lb 9.6 oz (80.1 kg)  11/17/17 177 lb 12.8 oz (80.6 kg)    Physical Exam  General appearance - alert, well appearing, and in no distress.  She is talking in complete sentences Mental status - normal mood, behavior, speech, dress, motor activity, and thought processes Neck - supple, no significant adenopathy Chest -breath sounds are slightly decreased with prolonged expiratory phase Heart - normal rate, regular rhythm, normal S1, S2, no murmurs, rubs, clicks or gallops Extremities - peripheral pulses normal, no pedal edema, no clubbing or cyanosis  CMP Latest Ref Rng & Units 04/25/2017 11/28/2014 11/27/2014  Glucose 65 - 99 mg/dL 94 562(Z137(H) 308(M105(H)  BUN 6 - 20 mg/dL 10 8 7   Creatinine 0.44 - 1.00 mg/dL 5.780.63 4.690.66 6.290.68  Sodium 135 - 145 mmol/L 139 136 136  Potassium 3.5 - 5.1 mmol/L 3.1(L) 4.2 3.1(L)  Chloride 101 - 111 mmol/L 104 109 104  CO2 22 - 32 mmol/L 26 22 21(L)  Calcium 8.9 - 10.3 mg/dL 9.0 9.1 5.2(W8.6(L)  Total Protein 6.0 - 8.3 g/dL - - -  Total Bilirubin 0.3 - 1.2 mg/dL - - -  Alkaline Phos 39 - 117 U/L - - -  AST 0 - 37 U/L - - -  ALT 0 - 35 U/L - - -   Lipid Panel  No results found for: CHOL, TRIG, HDL, CHOLHDL, VLDL, LDLCALC, LDLDIRECT  CBC    Component Value Date/Time   WBC 9.9 04/25/2017 2357   RBC 4.11 04/25/2017 2357   HGB 14.1 04/25/2017 2357   HCT 40.0 04/25/2017 2357   PLT 221 04/25/2017 2357   MCV 97.3 04/25/2017 2357   MCH 34.3 (H) 04/25/2017 2357   MCHC 35.3 04/25/2017 2357   RDW 12.5 04/25/2017 2357   LYMPHSABS 2.2 04/25/2017 2357    MONOABS 1.3 (H) 04/25/2017 2357   EOSABS 0.3 04/25/2017 2357  BASOSABS 0.1 04/25/2017 2357    ASSESSMENT AND PLAN:  1. Moderate persistent asthma without complication 2. Moderate persistent asthma with acute exacerbation Patient given a nebulizer machine. Encouraged her to check peak flow at least 3 days a week.  Will give a short course of prednisone.  Refill given on Symbicort and albuterol inhalers. She is agreeable to seeing Dr. Delford Field.  I will have front desk schedule this appointment for her. - budesonide-formoterol (SYMBICORT) 160-4.5 MCG/ACT inhaler; Inhale 2 puffs into the lungs 2 (two) times daily.  Dispense: 1 Inhaler; Refill: 6 - predniSONE (DELTASONE) 20 MG tablet; 1 tab PO daily x 4 days then 1/2 tab daily x 3 days.  Dispense: 6 tablet; Refill: 1 - albuterol (VENTOLIN HFA) 108 (90 Base) MCG/ACT inhaler; INHALE 2 PUFFS INTO THE LUNGS EVERY 4-6 HOURS AS NEEDED FOR WHEEZING OR SHORTNESS OF BREATH.  Dispense: 54 Inhaler; Refill: 6  3.  Declines influenza vaccine  Patient was given the opportunity to ask questions.  Patient verbalized understanding of the plan and was able to repeat key elements of the plan.   No orders of the defined types were placed in this encounter.    Requested Prescriptions   Signed Prescriptions Disp Refills  . budesonide-formoterol (SYMBICORT) 160-4.5 MCG/ACT inhaler 1 Inhaler 6    Sig: Inhale 2 puffs into the lungs 2 (two) times daily.  Marland Kitchen albuterol (VENTOLIN HFA) 108 (90 Base) MCG/ACT inhaler 54 Inhaler 6    Sig: INHALE 2 PUFFS INTO THE LUNGS EVERY 4-6 HOURS AS NEEDED FOR WHEEZING OR SHORTNESS OF BREATH.  . predniSONE (DELTASONE) 20 MG tablet 6 tablet 1    Sig: 1 tab PO daily x 4 days then 1/2 tab daily x 3 days.    No follow-ups on file.  Jonah Blue, MD, FACP

## 2018-05-23 ENCOUNTER — Telehealth: Payer: Self-pay | Admitting: Internal Medicine

## 2018-05-23 NOTE — Telephone Encounter (Signed)
-----   Message from [name], MD sent at 05/22/2018  5:32 PM EST ----- Regarding: Needs appointment with Dr. Delford Field for asthma.  Patient prefers late afternoon appointments

## 2018-05-23 NOTE — Telephone Encounter (Signed)
Called the patient and lvm for her to call back if she needs to reschedule the appointment I made for her with Dr. Delford Field at 06/21/2018 at 4:00

## 2018-06-05 MED FILL — ALBUTEROL SULFATE HFA 108 (: 108 (90 BAS | 16 days supply | Qty: 18 | Fill #14

## 2018-06-11 MED FILL — SYMBICORT 160-4.5 MCG INH: 160-4.5 | 30 days supply | Qty: 10 | Fill #0

## 2018-06-21 ENCOUNTER — Encounter: Payer: Self-pay | Admitting: Critical Care Medicine

## 2018-06-21 ENCOUNTER — Ambulatory Visit: Payer: 59 | Attending: Critical Care Medicine | Admitting: Critical Care Medicine

## 2018-06-21 ENCOUNTER — Other Ambulatory Visit: Payer: Self-pay

## 2018-06-21 VITALS — BP 114/86 | HR 90 | Temp 98.9°F | Ht 62.0 in | Wt 181.0 lb

## 2018-06-21 DIAGNOSIS — J4540 Moderate persistent asthma, uncomplicated: Secondary | ICD-10-CM

## 2018-06-21 DIAGNOSIS — J4541 Moderate persistent asthma with (acute) exacerbation: Secondary | ICD-10-CM

## 2018-06-21 DIAGNOSIS — M545 Low back pain: Secondary | ICD-10-CM

## 2018-06-21 DIAGNOSIS — J040 Acute laryngitis: Secondary | ICD-10-CM

## 2018-06-21 MED ORDER — CETIRIZINE HCL 10 MG PO TABS: 10.0000 mg | ORAL_TABLET | Freq: Every day | ORAL | 6 refills | Status: DC

## 2018-06-21 MED ORDER — PREDNISONE 20 MG PO TABS: ORAL_TABLET | ORAL | 0 refills | Status: DC

## 2018-06-21 MED ORDER — FLUTICASONE PROPIONATE 50 MCG/ACT NA SUSP: 2.0000 | Freq: Every day | NASAL | 6 refills | Status: DC

## 2018-06-21 MED ORDER — MONTELUKAST SODIUM 10 MG PO TABS: 10.0000 mg | ORAL_TABLET | Freq: Every day | ORAL | 6 refills | Status: DC

## 2018-06-21 MED FILL — predniSONE 20 MG TABS: 20 | 10 days supply | Qty: 20 | Fill #0

## 2018-06-21 NOTE — Progress Notes (Signed)
Subjective:    Patient ID: Dawn Robertson, female    DOB: Sep 05, 1985, 33 y.o.   MRN: [id number]  33 y.o.F lifelong asthma seen today as a work in for increased dyspnea and cough.  The patient was concerned she may have coronavirus.  She is however having no fever.  She has history of chronic allergic rhinitis and environmental allergies.  The patient notes more cough that is minimally productive.  There is increased shortness of breath.  There is low back pain radiating down to both legs.  Patient notes wheezing.  There is increased sinus drainage.  But there is no headaches.  She is not exposed to anyone who is risk for coronavirus.   The patient is seen today as a work and to evaluate breathing changes.     Asthma  She complains of chest tightness, cough, difficulty breathing, frequent throat clearing, shortness of breath, sputum production and wheezing. There is no hemoptysis or hoarse voice. This is a chronic problem. The current episode started more than 1 year ago. The problem occurs 2 to 4 times per day. The problem has been gradually worsening. The cough is productive of sputum. Associated symptoms include dyspnea on exertion, ear congestion, malaise/fatigue, myalgias, nasal congestion, postnasal drip, rhinorrhea and sneezing. Pertinent negatives include no appetite change, chest pain, ear pain, fever, headaches, heartburn, orthopnea, PND or sore throat. Her symptoms are aggravated by minimal activity, exposure to fumes, pollen, exposure to smoke, change in weather and climbing stairs. Her past medical history is significant for asthma.   Past Medical History:  Diagnosis Date  . Asthma    seasonal  . Seasonal allergies   . Shortness of breath   . Tobacco abuse      Family History  Problem Relation Age of Onset  . Asthma Mother   . Breast cancer Mother 89       s/p surgery and radiation  . Other Mother        hx of hysterectomy for bleeding/blood clotting in her 4s  . Asthma  Father   . Diabetes type II Paternal Aunt   . Prostate cancer Other        maternal great uncle (MGM's brother) d. later age     Social History   Socioeconomic History  . Marital status: Single    Spouse name: Not on file  . Number of children: Not on file  . Years of education: Not on file  . Highest education level: Not on file  Occupational History  . Not on file  Social Needs  . Financial resource strain: Not on file  . Food insecurity:    Worry: Not on file    Inability: Not on file  . Transportation needs:    Medical: Not on file    Non-medical: Not on file  Tobacco Use  . Smoking status: Former Smoker    Types: Cigarettes    Last attempt to quit: 03/28/2013    Years since quitting: 5.2  . Smokeless tobacco: Never Used  . Tobacco comment: hx of 1 pk every 3 days  Substance and Sexual Activity  . Alcohol use: Yes    Alcohol/week: 0.0 standard drinks    Comment: daily glass of wine  . Drug use: No  . Sexual activity: Never    Birth control/protection: None  Lifestyle  . Physical activity:    Days per week: Not on file    Minutes per session: Not on file  . Stress: Not  on file  Relationships  . Social connections:    Talks on phone: Not on file    Gets together: Not on file    Attends religious service: Not on file    Active member of club or organization: Not on file    Attends meetings of clubs or organizations: Not on file    Relationship status: Not on file  . Intimate partner violence:    Fear of current or ex partner: Not on file    Emotionally abused: Not on file    Physically abused: Not on file    Forced sexual activity: Not on file  Other Topics Concern  . Not on file  Social History Narrative  . Not on file     No Known Allergies   Outpatient Medications Prior to Visit  Medication Sig Dispense Refill  . albuterol (VENTOLIN HFA) 108 (90 Base) MCG/ACT inhaler INHALE 2 PUFFS INTO THE LUNGS EVERY 4-6 HOURS AS NEEDED FOR WHEEZING OR SHORTNESS  OF BREATH. 54 Inhaler 6  . budesonide-formoterol (SYMBICORT) 160-4.5 MCG/ACT inhaler Inhale 2 puffs into the lungs 2 (two) times daily. 1 Inhaler 6  . ipratropium-albuterol (DUONEB) 0.5-2.5 (3) MG/3ML SOLN Take 3 mLs by nebulization every 4 (four) hours as needed. For sob. 360 mL 2  . montelukast (SINGULAIR) 10 MG tablet Take 1 tablet (10 mg total) by mouth at bedtime. 30 tablet 6  . Phenylephrine-DM-GG-APAP (MUCINEX FAST-MAX) 5-10-200-325 MG CAPS Take 1 tablet by mouth every 4 (four) hours as needed.     . cetirizine (ZYRTEC) 10 MG tablet Take 1 tablet (10 mg total) by mouth daily. (Patient not taking: Reported on 05/22/2018) 30 tablet 1  . fluticasone (FLONASE) 50 MCG/ACT nasal spray Place 2 sprays into both nostrils daily. (Patient not taking: Reported on 06/21/2018) 16 g 6  . predniSONE (DELTASONE) 20 MG tablet 1 tab PO daily x 4 days then 1/2 tab daily x 3 days. (Patient not taking: Reported on 06/21/2018) 6 tablet 1   No facility-administered medications prior to visit.       Review of Systems  Constitutional: Positive for malaise/fatigue. Negative for appetite change and fever.  HENT: Positive for postnasal drip, rhinorrhea and sneezing. Negative for ear pain, hoarse voice and sore throat.   Respiratory: Positive for cough, sputum production, shortness of breath and wheezing. Negative for hemoptysis.   Cardiovascular: Positive for dyspnea on exertion. Negative for chest pain and PND.  Gastrointestinal: Negative for heartburn.  Musculoskeletal: Positive for myalgias.  Neurological: Negative for headaches.       Objective:   Physical Exam Vitals:   06/21/18 1558  BP: 114/86  Pulse: 90  Temp: 98.9 F (37.2 C)  TempSrc: Oral  SpO2: 98%  Weight: 181 lb (82.1 kg)  Height: 5\' 2"  (1.575 m)    Gen: Pleasant, well-nourished, in no distress,  normal affect  ENT: Nasal turbinate edema without purulence,  mouth clear,  oropharynx clear, ++ postnasal drip  Neck: No JVD, no TMG, no  carotid bruits  Lungs: No use of accessory muscles, no dullness to percussion, inspir exp wheezes.   Cardiovascular: RRR, heart sounds normal, no murmur or gallops, no peripheral edema  Abdomen: soft and NT, no HSM,  BS normal  Musculoskeletal: No deformities, no cyanosis or clubbing  Neuro: alert, non focal  Skin: Warm, no lesions or rashes  No results found.        Assessment & Plan:  I personally reviewed all images and lab data in the [facility]  system as well as any outside material available during this office visit and agree with the  radiology impressions.   Moderate persistent asthma Moderate persistent asthma with acute exacerbation  Plan will be to administer a course of prednisone 20 mg daily for 5 days and will refill Flonase and Zyrtec  I also reinstructed the patient as to the proper use of her Symbicort and albuterol inhalers   Chontel was seen today for asthma.  Diagnoses and all orders for this visit:  Moderate persistent asthma with acute exacerbation  Moderate persistent asthma without complication -     predniSONE (DELTASONE) 20 MG tablet; Take Two daily -     montelukast (SINGULAIR) 10 MG tablet; Take 1 tablet (10 mg total) by mouth at bedtime.  Laryngitis, acute -     cetirizine (ZYRTEC) 10 MG tablet; Take 1 tablet (10 mg total) by mouth daily.  Other orders -     fluticasone (FLONASE) 50 MCG/ACT nasal spray; Place 2 sprays into both nostrils daily.    I also discussed with the patient in fact that she does not have the coronavirus

## 2018-06-21 NOTE — Patient Instructions (Addendum)
Begin prednisone take two 20 mg tablets daily for 5 days, this will be dispensed today from our pharmacy here  Begin Flonase 2 sprays each nostril daily use her supply at home and refills will be mailed to your home  Resume Zyrtec cetirizine 1 daily this will be mailed to your home  Note your albuterol and Symbicort and montelukast have multiple refills left and you simply need to call the pharmacy and let them know and they will mail it to you  A Video/telephone visit with Dr Delford Field will be scheduled for recheck in two weeks

## 2018-06-22 MED FILL — MONTELUKAST SOD 10 MG TAB: 10 | 30 days supply | Qty: 30 | Fill #0

## 2018-06-22 NOTE — Assessment & Plan Note (Signed)
Moderate persistent asthma with acute exacerbation  Plan will be to administer a course of prednisone 20 mg daily for 5 days and will refill Flonase and Zyrtec  I also reinstructed the patient as to the proper use of her Symbicort and albuterol inhalers

## 2018-07-05 MED FILL — SYMBICORT 160-4.5 MCG INH: 160-4.5 | 30 days supply | Qty: 10 | Fill #1

## 2018-07-05 MED FILL — ALBUTEROL SULFATE HFA 108 (: 108 (90 BAS | 25 days supply | Qty: 18 | Fill #0

## 2018-07-10 ENCOUNTER — Ambulatory Visit: Payer: 59 | Attending: Critical Care Medicine | Admitting: Critical Care Medicine

## 2018-07-10 ENCOUNTER — Other Ambulatory Visit: Payer: Self-pay

## 2018-07-10 ENCOUNTER — Encounter: Payer: Self-pay | Admitting: Critical Care Medicine

## 2018-07-10 DIAGNOSIS — J4541 Moderate persistent asthma with (acute) exacerbation: Secondary | ICD-10-CM | POA: Diagnosis not present

## 2018-07-10 DIAGNOSIS — J4540 Moderate persistent asthma, uncomplicated: Secondary | ICD-10-CM | POA: Diagnosis not present

## 2018-07-10 DIAGNOSIS — K219 Gastro-esophageal reflux disease without esophagitis: Secondary | ICD-10-CM | POA: Diagnosis not present

## 2018-07-10 MED ORDER — PANTOPRAZOLE SODIUM 40 MG PO TBEC: 40.0000 mg | DELAYED_RELEASE_TABLET | Freq: Every day | ORAL | 3 refills | Status: DC

## 2018-07-10 MED ORDER — PREDNISONE 20 MG PO TABS: ORAL_TABLET | ORAL | 0 refills | Status: DC

## 2018-07-10 NOTE — Progress Notes (Signed)
Subjective:    Patient ID: Dawn Robertson, female    DOB: 01/11/1986, 33 y.o.   MRN: [id number] Virtual Visit via Telephone Note  I connected with Dawn Robertson on 07/10/18 at  3:30 PM EDT by telephone and verified that I am speaking with the correct person using two identifiers.   I discussed the limitations, risks, security and privacy concerns of performing an evaluation and management service by telephone and the availability of in person appointments. I also discussed with the patient that there may be a patient responsible charge related to this service. The patient expressed understanding and agreed to proceed.   History of Present Illness:   Here for telephone visit.  33 y.o.F lifelong asthma seen by way of a telephone visit.  The patient had previously been seen in March for severe persistent asthma.  We gave the patient a course of prednisone and continue to have her maintain Symbicort and as needed albuterol.  In the interim the patient states her breathing is gotten worse again after being off the prednisone.  She is coughing up more mucus.  Her chest and back are hurting and there is a sharp stabbing chest pain in the front and back that comes and goes she states she is coughing up small amounts of yellow mucus.  She has some sweatiness.  There is increased nasal congestion.  She states there is significant heartburn and high level reflux symptoms.  She states when she is having a heartburn this is when the chest pain comes on as well.  She notes she did have allergy testing many years ago and had significant environmental allergies noted.  She is not on reflux medications at this time.  She is not taking the Zyrtec as prescribed.  She is maintained on the Symbicort and the Singulair along with Flonase.  Patient does remain concerned about possible exposure to COVID.  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  She does not know the results of that  individuals COVID testing.  She however has not had fever with her current symptoms.  Asthma  She complains of chest tightness, cough, difficulty breathing, frequent throat clearing, shortness of breath, sputum production and wheezing. There is no hemoptysis or hoarse voice. This is a chronic problem. The current episode started more than 1 year ago. The problem occurs 2 to 4 times per day. The problem has been gradually worsening. The cough is productive of sputum. Associated symptoms include chest pain, dyspnea on exertion, ear congestion, malaise/fatigue, myalgias, nasal congestion, postnasal drip, rhinorrhea and sneezing. Pertinent negatives include no appetite change, ear pain, fever, headaches, heartburn, orthopnea, PND, sore throat or trouble swallowing. Her symptoms are aggravated by minimal activity, exposure to fumes, pollen, exposure to smoke, change in weather and climbing stairs. Her past medical history is significant for asthma.    Review of Systems  Constitutional: Positive for malaise/fatigue. Negative for appetite change and fever.  HENT: Positive for postnasal drip, rhinorrhea, sinus pain and sneezing. Negative for ear discharge, ear pain, hoarse voice, sinus pressure, sore throat and trouble swallowing.   Eyes: Negative for redness and itching.  Respiratory: Positive for cough, sputum production, chest tightness, shortness of breath and wheezing. Negative for hemoptysis.   Cardiovascular: Positive for chest pain and dyspnea on exertion. Negative for PND.  Gastrointestinal: Negative for heartburn.  Musculoskeletal: Positive for myalgias.  Neurological: Negative for headaches.  Observations/Objective: No observations, telephone visit   Assessment and  Plan: #1 severe persistent asthma with recurrent exacerbation, note COVID is unlikely given the patient's symptom complex  Plan will be for the patient to maintain Symbicort twice daily and will receive another round of prednisone  40 mg daily for 5 days  The patient will continue use albuterol as needed  #2 severe gastroesophageal reflux likely causing the patient's chest pain syndrome  Will begin Protonix 40 mg daily and I instructed the patient he use this on empty stomach and then eat 30 minutes later, as well we will give the patient a reflux diet I went over it with her on the phone and she will have the diet sent to her by email  #3 severe allergic rhinitis  The patient will continue Flonase and has been instructed to pick up a bottle of Zyrtec to take 10 mg daily at bedtime   Follow Up Instructions: The patient knows that she will return on Monday next week for a follow-up visit either in person if she is worse or by a telephone visit if she is improving   I discussed the assessment and treatment plan with the patient. The patient was provided an opportunity to ask questions and all were answered. The patient agreed with the plan and demonstrated an understanding of the instructions.   The patient was advised to call back or seek an in-person evaluation if the symptoms worsen or if the condition fails to improve as anticipated.  I provided 30  minutes of non-face-to-face time during this encounter.   Shan Levans, MD

## 2018-07-10 NOTE — Progress Notes (Signed)
Worked patient up for her televisit with Dr. Delford Field. States that her breathing/cough have been about the same. Did note some improvement while on the Prednisone.

## 2018-07-10 NOTE — Patient Instructions (Signed)
Food Choices for Gastroesophageal Reflux Disease, Adult  When you have gastroesophageal reflux disease (GERD), the foods you eat and your eating habits are very important. Choosing the right foods can help ease the discomfort of GERD. Consider working with a diet and nutrition specialist (dietitian) to help you make healthy food choices.  What general guidelines should I follow?    Eating plan  · Choose healthy foods low in fat, such as fruits, vegetables, whole grains, low-fat dairy products, and lean meat, fish, and poultry.  · Eat frequent, small meals instead of three large meals each day. Eat your meals slowly, in a relaxed setting. Avoid bending over or lying down until 2-3 hours after eating.  · Limit high-fat foods such as fatty meats or fried foods.  · Limit your intake of oils, butter, and shortening to less than 8 teaspoons each day.  · Avoid the following:  ? Foods that cause symptoms. These may be different for different people. Keep a food diary to keep track of foods that cause symptoms.  ? Alcohol.  ? Drinking large amounts of liquid with meals.  ? Eating meals during the 2-3 hours before bed.  · Cook foods using methods other than frying. This may include baking, grilling, or broiling.  Lifestyle  · Maintain a healthy weight. Ask your health care provider what weight is healthy for you. If you need to lose weight, work with your health care provider to do so safely.  · Exercise for at least 30 minutes on 5 or more days each week, or as told by your health care provider.  · Avoid wearing clothes that fit tightly around your waist and chest.  · Do not use any products that contain nicotine or tobacco, such as cigarettes and e-cigarettes. If you need help quitting, ask your health care provider.  · Sleep with the head of your bed raised. Use a wedge under the mattress or blocks under the bed frame to raise the head of the bed.  What foods are not recommended?  The items listed may not be a complete  list. Talk with your dietitian about what dietary choices are best for you.  Grains  Pastries or quick breads with added fat. French toast.  Vegetables  Deep fried vegetables. French fries. Any vegetables prepared with added fat. Any vegetables that cause symptoms. For some people this may include tomatoes and tomato products, chili peppers, onions and garlic, and horseradish.  Fruits  Any fruits prepared with added fat. Any fruits that cause symptoms. For some people this may include citrus fruits, such as oranges, grapefruit, pineapple, and lemons.  Meats and other protein foods  High-fat meats, such as fatty beef or pork, hot dogs, ribs, ham, sausage, salami and bacon. Fried meat or protein, including fried fish and fried chicken. Nuts and nut butters.  Dairy  Whole milk and chocolate milk. Sour cream. Cream. Ice cream. Cream cheese. Milk shakes.  Beverages  Coffee and tea, with or without caffeine. Carbonated beverages. Sodas. Energy drinks. Fruit juice made with acidic fruits (such as orange or grapefruit). Tomato juice. Alcoholic drinks.  Fats and oils  Butter. Margarine. Shortening. Ghee.  Sweets and desserts  Chocolate and cocoa. Donuts.  Seasoning and other foods  Pepper. Peppermint and spearmint. Any condiments, herbs, or seasonings that cause symptoms. For some people, this may include curry, hot sauce, or vinegar-based salad dressings.  Summary  · When you have gastroesophageal reflux disease (GERD), food and lifestyle choices are very   important to help ease the discomfort of GERD.  · Eat frequent, small meals instead of three large meals each day. Eat your meals slowly, in a relaxed setting. Avoid bending over or lying down until 2-3 hours after eating.  · Limit high-fat foods such as fatty meat or fried foods.  This information is not intended to replace advice given to you by your health care provider. Make sure you discuss any questions you have with your health care provider.  Document Released:  03/14/2005 Document Revised: 03/15/2016 Document Reviewed: 03/15/2016  Elsevier Interactive Patient Education © 2019 [employer]

## 2018-07-11 ENCOUNTER — Telehealth: Payer: Self-pay | Admitting: Internal Medicine

## 2018-07-11 NOTE — Telephone Encounter (Signed)
Patient called in regards to her appointment stating she can only do her appointment as a televisit vs. Coming in person.   states that she was also set to get paperwork in regards to her medication but has not received anything via email Please follow up

## 2018-07-12 NOTE — Telephone Encounter (Signed)
Notified patient of message.

## 2018-07-12 NOTE — Telephone Encounter (Signed)
Pt is already schedule for a televist for 4/20.  As far as paperwork for medication will need to check with Kelly(patient assistance)

## 2018-07-15 NOTE — Progress Notes (Signed)
Subjective:    Patient ID: Dawn Robertson, female    DOB: 1985/08/13, 33 y.o.   MRN: [id number]  Virtual Visit via Telephone Note  I connected with Dawn Robertson on 07/15/18 at 10:00 AM EDT by telephone and verified that I am speaking with the correct person using two identifiers.   Consent:  I discussed the limitations, risks, security and privacy concerns of performing an evaluation and management service by telephone and the availability of in person appointments. I also discussed with the patient that there may be a patient responsible charge related to this service. The patient expressed understanding and agreed to proceed.  Location of patient: The patient is at home  Location of provider: I was in my office  Persons participating in the televisit with the patient.  The patient was by herself with this visit History of Present Illness:   Here for telephone visit and this is a follow-up visit from a week ago when we did a previous telephone visit 33 y.o.F lifelong asthma seen by way of a telephone visit.  The patient had previously been seen in March for severe persistent asthma.  We gave the patient a course of prednisone and continue to have her maintain Symbicort and as needed albuterol.  When we saw the patient last week by way of a telephone visit she was concerned about coronavirus exposure.  She had increased cough and chest and back pain.  Increased shortness of breath and thin yellow mucus.  We gave her a prescription for Protonix to take daily because of high level reflux symptoms and as well at pulse prednisone.  She is on the Protonix and she is finished the prednisone.  She also picked up and started Zyrtec daily.  She is maintaining Flonase Singulair and Symbicort.  The patient states her symptoms are markedly improved from a week ago.  There is no longer any chest pain.  Shortness of breath comes and goes.  Overall she is improved.  She has no reflux symptoms.   She confirms she is compliant with all of her medications.  She is not developed a fever.  She is not developed any other signs or symptoms consistent with even mild coronavirus  The patient is back at work and apparently the worker she was exposed to does not have the coronavirus   Asthma  She complains of frequent throat clearing, shortness of breath, sputum production and wheezing. There is no chest tightness, cough, difficulty breathing, hemoptysis or hoarse voice. Primary symptoms comments: whie mu. This is a chronic problem. The current episode started more than 1 year ago. The problem occurs 2 to 4 times per day. The problem has been gradually improving. The cough is productive of sputum. Pertinent negatives include no appetite change, chest pain, dyspnea on exertion, ear congestion, ear pain, fever, headaches, heartburn, malaise/fatigue, myalgias, nasal congestion, orthopnea, PND, postnasal drip, rhinorrhea, sneezing, sore throat or trouble swallowing. Her symptoms are aggravated by exposure to fumes, pollen, exposure to smoke, change in weather and climbing stairs. Her past medical history is significant for asthma.    Review of Systems  Constitutional: Negative for appetite change, fever and malaise/fatigue.  HENT: Positive for sinus pain. Negative for ear discharge, ear pain, hoarse voice, postnasal drip, rhinorrhea, sinus pressure, sneezing, sore throat and trouble swallowing.   Eyes: Negative for redness and itching.  Respiratory: Positive for sputum production, chest tightness, shortness of breath and wheezing. Negative for cough and hemoptysis.   Cardiovascular: Negative  for chest pain, dyspnea on exertion and PND.  Gastrointestinal: Negative for heartburn.  Musculoskeletal: Negative for myalgias.  Neurological: Negative for headaches.  Observations/Objective: No observations, telephone visit   Assessment and Plan: #1 severe persistent asthma with recurrent exacerbation now  significantly improved, note COVID is unlikely given the patient's symptom complex  Plan will be for the patient to maintain Symbicort twice daily and continue Zyrtec Flonase and Singulair.  No additional prednisone needed. The patient will continue use albuterol as needed  I reassured the patient she does not have COVID  #2 severe gastroesophageal reflux likely causing the patient's chest pain syndrome now markedly improved on reflux diet and Protonix  Will continue Protonix daily and continue the reflux diet  #3 severe allergic rhinitis  The patient will continue Flonase and Zyrtec  Follow Up Instructions: The patient understands her instructions and will return to see us for an  office visit end of May  I discussed the assessment and treatment plan with the patient. The patient was provided an opportunity to ask questions and all were answered. The patient agreed with the plan and demonstrated an understanding of the instructions.   The patient was advised to call back or seek an in-person evaluation if the symptoms worsen or if the condition fails to improve as anticipated.  I provided 15 minutes of non-face-to-face time during this encounter  including  median intraservice time , review of notes, labs, imaging, medications  and explaining diagnosis and management to the patient .    Shan LevansPatrick Keilan Nichol, MD

## 2018-07-16 ENCOUNTER — Other Ambulatory Visit: Payer: Self-pay

## 2018-07-16 ENCOUNTER — Ambulatory Visit: Payer: 59 | Attending: Critical Care Medicine | Admitting: Critical Care Medicine

## 2018-07-16 ENCOUNTER — Encounter: Payer: Self-pay | Admitting: Critical Care Medicine

## 2018-07-16 DIAGNOSIS — K219 Gastro-esophageal reflux disease without esophagitis: Secondary | ICD-10-CM | POA: Diagnosis not present

## 2018-07-16 DIAGNOSIS — Z87891 Personal history of nicotine dependence: Secondary | ICD-10-CM

## 2018-07-16 DIAGNOSIS — J4540 Moderate persistent asthma, uncomplicated: Secondary | ICD-10-CM

## 2018-07-18 ENCOUNTER — Encounter: Payer: Self-pay | Admitting: Critical Care Medicine

## 2018-07-26 MED FILL — ALBUTEROL SULFATE HFA 108 (: 108 (90 BAS | 16 days supply | Qty: 18 | Fill #1

## 2018-08-03 MED FILL — SYMBICORT 160-4.5 MCG INH: 160-4.5 | 30 days supply | Qty: 10 | Fill #2

## 2018-08-03 MED FILL — PANTOPRAZOLE SOD DR 40 MG T: 40 | 30 days supply | Qty: 30 | Fill #0

## 2018-08-13 NOTE — Progress Notes (Deleted)
Subjective:    Patient ID: Dawn Robertson, female    DOB: 03/28/1985, 33 y.o.   MRN: [id number]  Here for telephone visit and this is a follow-up visit from a week ago when we did a previous telephone visit 33 y.o.F lifelong asthma seen by way of a telephone visit.  The patient had previously been seen in March for severe persistent asthma.  We gave the patient a course of prednisone and continue to have her maintain Symbicort and as needed albuterol.  When we saw the patient last week by way of a telephone visit she was concerned about coronavirus exposure.  She had increased cough and chest and back pain.  Increased shortness of breath and thin yellow mucus.  We gave her a prescription for Protonix to take daily because of high level reflux symptoms and as well at pulse prednisone.  She is on the Protonix and she is finished the prednisone.  She also picked up and started Zyrtec daily.  She is maintaining Flonase Singulair and Symbicort.  The patient states her symptoms are markedly improved from a week ago.  There is no longer any chest pain.  Shortness of breath comes and goes.  Overall she is improved.  She has no reflux symptoms.  She confirms she is compliant with all of her medications.  She is not developed a fever.  She is not developed any other signs or symptoms consistent with even mild coronavirus  The patient is back at work and apparently the worker she was exposed to does not have the coronavirus   Asthma  She complains of frequent throat clearing, shortness of breath, sputum production and wheezing. There is no chest tightness, cough, difficulty breathing, hemoptysis or hoarse voice. Primary symptoms comments: whie mu. This is a chronic problem. The current episode started more than 1 year ago. The problem occurs 2 to 4 times per day. The problem has been gradually improving. The cough is productive of sputum. Pertinent negatives include no appetite change, chest pain, dyspnea  on exertion, ear congestion, ear pain, fever, headaches, heartburn, malaise/fatigue, myalgias, nasal congestion, orthopnea, PND, postnasal drip, rhinorrhea, sneezing, sore throat or trouble swallowing. Her symptoms are aggravated by exposure to fumes, pollen, exposure to smoke, change in weather and climbing stairs. Her past medical history is significant for asthma.    Review of Systems  Constitutional: Negative for appetite change, fever and malaise/fatigue.  HENT: Positive for sinus pain. Negative for ear discharge, ear pain, hoarse voice, postnasal drip, rhinorrhea, sinus pressure, sneezing, sore throat and trouble swallowing.   Eyes: Negative for redness and itching.  Respiratory: Positive for sputum production, chest tightness, shortness of breath and wheezing. Negative for cough and hemoptysis.   Cardiovascular: Negative for chest pain, dyspnea on exertion and PND.  Gastrointestinal: Negative for heartburn.  Musculoskeletal: Negative for myalgias.  Neurological: Negative for headaches.  Observations/Objective: No observations, telephone visit   Assessment and Plan: #1 severe persistent asthma with recurrent exacerbation now significantly improved, note COVID is unlikely given the patient's symptom complex             Plan will be for the patient to maintain Symbicort twice daily and continue Zyrtec Flonase and Singulair.  No additional prednisone needed.  The patient will continue use albuterol as needed             I reassured the patient she does not have COVID  #2 severe gastroesophageal reflux likely causing the patient's chest pain syndrome now  markedly improved on reflux diet and Protonix             Will continue Protonix daily and continue the reflux diet  #3 severe allergic rhinitis             The patient will continue Flonase and Zyrtec      Review of Systems     Objective:   Physical Exam        Assessment & Plan:

## 2018-08-14 ENCOUNTER — Encounter: Payer: Self-pay | Admitting: Critical Care Medicine

## 2018-08-14 ENCOUNTER — Ambulatory Visit: Payer: 59 | Admitting: Critical Care Medicine

## 2018-08-16 ENCOUNTER — Ambulatory Visit: Payer: 59 | Admitting: Critical Care Medicine

## 2018-08-17 NOTE — Telephone Encounter (Signed)
Please look into the scheduling concern via mychart

## 2018-08-22 MED FILL — ALBUTEROL SULFATE HFA 108 (: 108 (90 BAS | 16 days supply | Qty: 18 | Fill #2

## 2018-08-30 MED FILL — SYMBICORT 160-4.5 MCG INH: 160-4.5 | 30 days supply | Qty: 10 | Fill #3

## 2018-08-30 MED FILL — MONTELUKAST SOD 10 MG TAB: 10 | 30 days supply | Qty: 30 | Fill #1

## 2018-09-06 ENCOUNTER — Ambulatory Visit: Payer: 59 | Attending: Critical Care Medicine | Admitting: Critical Care Medicine

## 2018-09-06 ENCOUNTER — Other Ambulatory Visit: Payer: Self-pay

## 2018-09-06 ENCOUNTER — Encounter: Payer: Self-pay | Admitting: Critical Care Medicine

## 2018-09-06 VITALS — BP 119/76 | HR 95 | Temp 98.8°F | Ht 62.0 in | Wt 184.0 lb

## 2018-09-06 DIAGNOSIS — J4540 Moderate persistent asthma, uncomplicated: Secondary | ICD-10-CM | POA: Diagnosis not present

## 2018-09-06 DIAGNOSIS — Z87891 Personal history of nicotine dependence: Secondary | ICD-10-CM

## 2018-09-06 DIAGNOSIS — K219 Gastro-esophageal reflux disease without esophagitis: Secondary | ICD-10-CM

## 2018-09-06 NOTE — Progress Notes (Signed)
Subjective:    Patient ID: Dawn Robertson, female    DOB: 12/18/1985, 33 y.o.   MRN: [id number]  Is a 33 year old female with lifelong asthma seen in follow-up after previous telephone visit.  Overall she is improved.  She had previously been seen in March for severe persistent asthma and at that time we gave the patient a course of prednisone.  She is now maintaining the Symbicort 2 inhalations twice daily.  The patient also now is on Protonix daily and following a reflux diet and her reflux symptoms are markedly improved.  She does maintain the Singulair Flonase and Zyrtec.  The patient is back at work and is not concerned about coronavirus at this time   Asthma She complains of cough, frequent throat clearing, hoarse voice, shortness of breath, sputum production and wheezing. There is no chest tightness. Primary symptoms comments: Mucus is white to yellow. This is a chronic problem. The problem has been gradually improving. Associated symptoms include dyspnea on exertion and rhinorrhea. Pertinent negatives include no ear congestion, ear pain, headaches, heartburn, nasal congestion, postnasal drip, sore throat or trouble swallowing. Her past medical history is significant for asthma.   Past Medical History:  Diagnosis Date  . Asthma    seasonal  . Seasonal allergies   . Shortness of breath   . Tobacco abuse      Family History  Problem Relation Age of Onset  . Asthma Mother   . Breast cancer Mother 2331       s/p surgery and radiation  . Other Mother        hx of hysterectomy for bleeding/blood clotting in her 1120s  . Asthma Father   . Diabetes type II Paternal Aunt   . Prostate cancer Other        maternal great uncle (MGM's brother) d. later age     Social History   Socioeconomic History  . Marital status: Single    Spouse name: Not on file  . Number of children: Not on file  . Years of education: Not on file  . Highest education level: Not on file  Occupational  History  . Not on file  Social Needs  . Financial resource strain: Not on file  . Food insecurity    Worry: Not on file    Inability: Not on file  . Transportation needs    Medical: Not on file    Non-medical: Not on file  Tobacco Use  . Smoking status: Former Smoker    Types: Cigarettes    Quit date: 03/28/2013    Years since quitting: 5.4  . Smokeless tobacco: Never Used  . Tobacco comment: hx of 1 pk every 3 days  Substance and Sexual Activity  . Alcohol use: Yes    Alcohol/week: 0.0 standard drinks    Comment: daily glass of wine  . Drug use: No  . Sexual activity: Never    Birth control/protection: None  Lifestyle  . Physical activity    Days per week: Not on file    Minutes per session: Not on file  . Stress: Not on file  Relationships  . Social Musicianconnections    Talks on phone: Not on file    Gets together: Not on file    Attends religious service: Not on file    Active member of club or organization: Not on file    Attends meetings of clubs or organizations: Not on file    Relationship status: Not on file  .  Intimate partner violence    Fear of current or ex partner: Not on file    Emotionally abused: Not on file    Physically abused: Not on file    Forced sexual activity: Not on file  Other Topics Concern  . Not on file  Social History Narrative  . Not on file     No Known Allergies   Outpatient Medications Prior to Visit  Medication Sig Dispense Refill  . albuterol (VENTOLIN HFA) 108 (90 Base) MCG/ACT inhaler INHALE 2 PUFFS INTO THE LUNGS EVERY 4-6 HOURS AS NEEDED FOR WHEEZING OR SHORTNESS OF BREATH. 54 Inhaler 6  . budesonide-formoterol (SYMBICORT) 160-4.5 MCG/ACT inhaler Inhale 2 puffs into the lungs 2 (two) times daily. 1 Inhaler 6  . cetirizine (ZYRTEC) 10 MG tablet Take 1 tablet (10 mg total) by mouth daily. 30 tablet 6  . fluticasone (FLONASE) 50 MCG/ACT nasal spray Place 2 sprays into both nostrils daily. 16 g 6  . ipratropium-albuterol (DUONEB)  0.5-2.5 (3) MG/3ML SOLN Take 3 mLs by nebulization every 4 (four) hours as needed. For sob. 360 mL 2  . montelukast (SINGULAIR) 10 MG tablet Take 1 tablet (10 mg total) by mouth at bedtime. 30 tablet 6  . pantoprazole (PROTONIX) 40 MG tablet Take 1 tablet (40 mg total) by mouth daily. 30 tablet 3   No facility-administered medications prior to visit.      Review of Systems  HENT: Positive for hoarse voice and rhinorrhea. Negative for ear pain, postnasal drip, sore throat and trouble swallowing.   Respiratory: Positive for cough, sputum production, shortness of breath and wheezing.   Cardiovascular: Positive for dyspnea on exertion.  Gastrointestinal: Negative for heartburn.  Neurological: Negative for headaches.       Objective:   Physical Exam Vitals:   09/06/18 1551  BP: 119/76  Pulse: 95  Temp: 98.8 F (37.1 C)  TempSrc: Oral  SpO2: 98%  Weight: 184 lb (83.5 kg)  Height: 5\' 2"  (1.575 m)    Gen: Pleasant, well-nourished, in no distress,  normal affect  ENT: No lesions,  mouth clear,  oropharynx clear, no postnasal drip  Neck: No JVD, no TMG, no carotid bruits  Lungs: No use of accessory muscles, no dullness to percussion, right upper lobe few mild expiratory wheezes otherwise clear  Cardiovascular: RRR, heart sounds normal, no murmur or gallops, no peripheral edema  Abdomen: soft and NT, no HSM,  BS normal  Musculoskeletal: No deformities, no cyanosis or clubbing  Neuro: alert, non focal  Skin: Warm, no lesions or rashes      Assessment & Plan:  I personally reviewed all images and lab data in the [facility] system as well as any outside material available during this office visit and agree with the  radiology impressions.   Moderate persistent asthma Moderate persistent asthma with significant improvement  We will continue Symbicort twice daily albuterol as needed Singulair daily Flonase and Zyrtec  Gastroesophageal reflux disease without esophagitis Reflux  disease markedly improved on Protonix and dietary changes  This will help her asthma syndrome significantly  Former smoker I congratulated the patient on staying off cigarettes at this time   [name] was seen today for asthma.  Diagnoses and all orders for this visit:  Moderate persistent asthma without complication  Gastroesophageal reflux disease without esophagitis  Former smoker    Will return in 4 months

## 2018-09-06 NOTE — Assessment & Plan Note (Signed)
Reflux disease markedly improved on Protonix and dietary changes  This will help her asthma syndrome significantly

## 2018-09-06 NOTE — Assessment & Plan Note (Signed)
Moderate persistent asthma with significant improvement  We will continue Symbicort twice daily albuterol as needed Singulair daily Flonase and Zyrtec

## 2018-09-06 NOTE — Assessment & Plan Note (Signed)
I congratulated the patient on staying off cigarettes at this time

## 2018-09-06 NOTE — Patient Instructions (Signed)
No change in medications for now  Please continue to follow your reflux diet  We will see you again in 4 months sooner if necessary

## 2018-09-10 MED FILL — ALBUTEROL SULFATE HFA 108 (: 108 (90 BAS | 16 days supply | Qty: 18 | Fill #3

## 2018-09-10 MED FILL — PANTOPRAZOLE SOD DR 40 MG T: 40 | 30 days supply | Qty: 30 | Fill #1

## 2018-09-27 MED FILL — SYMBICORT 160-4.5 MCG INH: 160-4.5 | 30 days supply | Qty: 10 | Fill #4

## 2018-10-08 MED FILL — ALBUTEROL SULFATE HFA 108 (: 108 (90 BAS | 16 days supply | Qty: 18 | Fill #4

## 2018-10-22 MED FILL — MONTELUKAST SOD 10 MG TAB: 10 | 30 days supply | Qty: 30 | Fill #2

## 2018-10-22 MED FILL — SYMBICORT 160-4.5 MCG INH: 160-4.5 | 30 days supply | Qty: 10 | Fill #5

## 2018-11-16 MED FILL — ALBUTEROL SULFATE HFA 108 (: 108 (90 BAS | 16 days supply | Qty: 18 | Fill #5

## 2018-11-16 MED FILL — SYMBICORT 160-4.5 MCG INH: 160-4.5 | 30 days supply | Qty: 10 | Fill #6

## 2018-12-12 ENCOUNTER — Encounter: Payer: Self-pay | Admitting: Internal Medicine

## 2018-12-12 ENCOUNTER — Other Ambulatory Visit: Payer: Self-pay | Admitting: Internal Medicine

## 2018-12-12 DIAGNOSIS — J4540 Moderate persistent asthma, uncomplicated: Secondary | ICD-10-CM

## 2018-12-12 MED FILL — SYMBICORT 160-4.5 MCG INH: 160-4.5 | 30 days supply | Qty: 10 | Fill #0

## 2018-12-12 MED FILL — ALBUTEROL SULFATE HFA 108 (: 108 (90 BAS | 16 days supply | Qty: 18 | Fill #6

## 2018-12-12 NOTE — Telephone Encounter (Signed)
Please fill if appropriate.  

## 2019-01-07 MED FILL — ALBUTEROL SULFATE HFA 108 (: 108 (90 BAS | 16 days supply | Qty: 18 | Fill #7

## 2019-01-07 MED FILL — SYMBICORT 160-4.5 MCG INH: 160-4.5 | 30 days supply | Qty: 10 | Fill #1

## 2019-01-24 ENCOUNTER — Encounter: Payer: Self-pay | Admitting: Critical Care Medicine

## 2019-01-24 ENCOUNTER — Other Ambulatory Visit: Payer: Self-pay

## 2019-01-24 ENCOUNTER — Ambulatory Visit: Payer: Managed Care, Other (non HMO) | Attending: Critical Care Medicine | Admitting: Critical Care Medicine

## 2019-01-24 VITALS — BP 120/79 | HR 81 | Temp 98.0°F | Ht 62.0 in | Wt 181.0 lb

## 2019-01-24 DIAGNOSIS — J4540 Moderate persistent asthma, uncomplicated: Secondary | ICD-10-CM

## 2019-01-24 DIAGNOSIS — K219 Gastro-esophageal reflux disease without esophagitis: Secondary | ICD-10-CM

## 2019-01-24 DIAGNOSIS — J3089 Other allergic rhinitis: Secondary | ICD-10-CM

## 2019-01-24 DIAGNOSIS — J4541 Moderate persistent asthma with (acute) exacerbation: Secondary | ICD-10-CM

## 2019-01-24 MED ORDER — IPRATROPIUM-ALBUTEROL 0.5-2.5 (3) MG/3ML IN SOLN: 3.0000 mL | RESPIRATORY_TRACT | 2 refills | Status: DC | PRN

## 2019-01-24 MED ORDER — CETIRIZINE HCL 10 MG PO TABS: 10.0000 mg | ORAL_TABLET | Freq: Every day | ORAL | 6 refills | Status: DC

## 2019-01-24 MED ORDER — PANTOPRAZOLE SODIUM 40 MG PO TBEC: 40.0000 mg | DELAYED_RELEASE_TABLET | Freq: Every day | ORAL | 3 refills | Status: DC

## 2019-01-24 MED ORDER — AEROCHAMBER MV MISC: 0 refills | Status: AC

## 2019-01-24 MED ORDER — MONTELUKAST SODIUM 10 MG PO TABS: 10.0000 mg | ORAL_TABLET | Freq: Every day | ORAL | 6 refills | Status: DC

## 2019-01-24 MED ORDER — ALBUTEROL SULFATE HFA 108 (90 BASE) MCG/ACT IN AERS: INHALATION_SPRAY | RESPIRATORY_TRACT | 2 refills | Status: DC

## 2019-01-24 MED ORDER — FLUTICASONE PROPIONATE 50 MCG/ACT NA SUSP: 2.0000 | Freq: Every day | NASAL | 6 refills | Status: DC

## 2019-01-24 NOTE — Patient Instructions (Addendum)
Use an AeroChamber spacer on your inhaler and take slower deeper breaths  Place the Symbicort inhaler near where you brush your teeth and due to inhalations on that twice a day the albuterol is as needed every 4-6 hours  Follow a reflux diet below as best you can  Resume Protonix 1 daily  Refills on all your medications were sent to our pharmacy  An allergy profile was sent we will call you with results  Return 4 months with Dr. Joya Gaskins       Food Choices for Gastroesophageal Reflux Disease, Adult When you have gastroesophageal reflux disease (GERD), the foods you eat and your eating habits are very important. Choosing the right foods can help ease your discomfort. Think about working with a nutrition specialist (dietitian) to help you make good choices. What are tips for following this plan?  Meals  Choose healthy foods that are low in fat, such as fruits, vegetables, whole grains, low-fat dairy products, and lean meat, fish, and poultry.  Eat small meals often instead of 3 large meals a day. Eat your meals slowly, and in a place where you are relaxed. Avoid bending over or lying down until 2-3 hours after eating.  Avoid eating meals 2-3 hours before bed.  Avoid drinking a lot of liquid with meals.  Cook foods using methods other than frying. Bake, grill, or broil food instead.  Avoid or limit: ? Chocolate. ? Peppermint or spearmint. ? Alcohol. ? Pepper. ? Black and decaffeinated coffee. ? Black and decaffeinated tea. ? Bubbly (carbonated) soft drinks. ? Caffeinated energy drinks and soft drinks.  Limit high-fat foods such as: ? Fatty meat or fried foods. ? Whole milk, cream, butter, or ice cream. ? Nuts and nut butters. ? Pastries, donuts, and sweets made with butter or shortening.  Avoid foods that cause symptoms. These foods may be different for everyone. Common foods that cause symptoms include: ? Tomatoes. ? Oranges, lemons, and limes. ? Peppers. ? Spicy  food. ? Onions and garlic. ? Vinegar. Lifestyle  Maintain a healthy weight. Ask your doctor what weight is healthy for you. If you need to lose weight, work with your doctor to do so safely.  Exercise for at least 30 minutes for 5 or more days each week, or as told by your doctor.  Wear loose-fitting clothes.  Do not smoke. If you need help quitting, ask your doctor.  Sleep with the head of your bed higher than your feet. Use a wedge under the mattress or blocks under the bed frame to raise the head of the bed. Summary  When you have gastroesophageal reflux disease (GERD), food and lifestyle choices are very important in easing your symptoms.  Eat small meals often instead of 3 large meals a day. Eat your meals slowly, and in a place where you are relaxed.  Limit high-fat foods such as fatty meat or fried foods.  Avoid bending over or lying down until 2-3 hours after eating.  Avoid peppermint and spearmint, caffeine, alcohol, and chocolate. This information is not intended to replace advice given to you by your health care provider. Make sure you discuss any questions you have with your health care provider. Document Released: 09/13/2011 Document Revised: 07/05/2018 Document Reviewed: 04/19/2016 Elsevier Patient Education  2020 [employer].

## 2019-01-24 NOTE — Assessment & Plan Note (Addendum)
Moderate persistent asthma stable at this time  Plan will be to continue Symbicort 2 puffs twice daily along with Singulair and Zyrtec  I reinforced proper inhaler technique and found that she was inhaling too quickly also gave the patient an AeroChamber today  We will also check a mold assay today

## 2019-01-24 NOTE — Assessment & Plan Note (Signed)
Gastrosoft reflux disease the patient will continue Protonix and refills were sent I also give the patient a reflux diet

## 2019-01-24 NOTE — Progress Notes (Signed)
Subjective:    Patient ID: Dawn Robertson, female    DOB: September 13, 1985, 33 y.o.   MRN: [id number]  Is a 33 year old female with lifelong asthma seen in follow-up after previous telephone visit.  Overall she is improved.  She had previously been seen in March for severe persistent asthma and at that time we gave the patient a course of prednisone.  She is now maintaining the Symbicort 2 inhalations twice daily.  The patient also now is on Protonix daily and following a reflux diet and her reflux symptoms are markedly improved.  She does maintain the Singulair Flonase and Zyrtec.  The patient is back at work and is not concerned about coronavirus at this time   01/24/2019 The patient continues to have up-and-down asthma type symptoms but overall is improved since she moved out a month ago of a home that had significant black mold infestation.  The patient continue Symbicort twice daily and albuterol as needed along with the Singulair Flonase and Zyrtec.  The patient has been off Protonix for about a month and noted increased reflux symptoms.  She is off cigarettes at this time.  She is due a flu vaccine but she declined that at this visit. U  Asthma She complains of frequent throat clearing, sputum production and wheezing. There is no chest tightness, cough, hoarse voice or shortness of breath. Primary symptoms comments: Mucus in am membrane and white. This is a chronic problem. The current episode started more than 1 year ago. Episode frequency: two attacks this past month. The problem has been gradually improving. Pertinent negatives include no dyspnea on exertion, ear congestion, ear pain, headaches, heartburn, myalgias, nasal congestion, postnasal drip, rhinorrhea, sneezing, sore throat or trouble swallowing. Associated symptoms comments: GERD. Her symptoms are aggravated by change in weather, pollen and URI. Her symptoms are alleviated by beta-agonist. Her past medical history is significant  for asthma.   Past Medical History:  Diagnosis Date  . Asthma    seasonal  . Seasonal allergies   . Shortness of breath   . Tobacco abuse      Family History  Problem Relation Age of Onset  . Asthma Mother   . Breast cancer Mother 12       s/p surgery and radiation  . Other Mother        hx of hysterectomy for bleeding/blood clotting in her 92s  . Asthma Father   . Diabetes type II Paternal Aunt   . Prostate cancer Other        maternal great uncle (MGM's brother) d. later age     Social History   Socioeconomic History  . Marital status: Single    Spouse name: Not on file  . Number of children: Not on file  . Years of education: Not on file  . Highest education level: Not on file  Occupational History  . Not on file  Social Needs  . Financial resource strain: Not on file  . Food insecurity    Worry: Not on file    Inability: Not on file  . Transportation needs    Medical: Not on file    Non-medical: Not on file  Tobacco Use  . Smoking status: Former Smoker    Types: Cigarettes    Quit date: 03/28/2013    Years since quitting: 5.8  . Smokeless tobacco: Never Used  . Tobacco comment: hx of 1 pk every 3 days  Substance and Sexual Activity  . Alcohol use: Yes  Alcohol/week: 0.0 standard drinks    Comment: daily glass of wine  . Drug use: No  . Sexual activity: Never    Birth control/protection: None  Lifestyle  . Physical activity    Days per week: Not on file    Minutes per session: Not on file  . Stress: Not on file  Relationships  . Social Musician on phone: Not on file    Gets together: Not on file    Attends religious service: Not on file    Active member of club or organization: Not on file    Attends meetings of clubs or organizations: Not on file    Relationship status: Not on file  . Intimate partner violence    Fear of current or ex partner: Not on file    Emotionally abused: Not on file    Physically abused: Not on file     Forced sexual activity: Not on file  Other Topics Concern  . Not on file  Social History Narrative  . Not on file     No Known Allergies   Outpatient Medications Prior to Visit  Medication Sig Dispense Refill  . SYMBICORT 160-4.5 MCG/ACT inhaler INHALE 2 PUFFS INTO THE LUNGS 2 (TWO) TIMES DAILY. 10.2 g 6  . albuterol (VENTOLIN HFA) 108 (90 Base) MCG/ACT inhaler INHALE 2 PUFFS INTO THE LUNGS EVERY 4-6 HOURS AS NEEDED FOR WHEEZING OR SHORTNESS OF BREATH. 54 Inhaler 6  . cetirizine (ZYRTEC) 10 MG tablet Take 1 tablet (10 mg total) by mouth daily. 30 tablet 6  . fluticasone (FLONASE) 50 MCG/ACT nasal spray Place 2 sprays into both nostrils daily. 16 g 6  . ipratropium-albuterol (DUONEB) 0.5-2.5 (3) MG/3ML SOLN Take 3 mLs by nebulization every 4 (four) hours as needed. For sob. 360 mL 2  . montelukast (SINGULAIR) 10 MG tablet Take 1 tablet (10 mg total) by mouth at bedtime. 30 tablet 6  . pantoprazole (PROTONIX) 40 MG tablet Take 1 tablet (40 mg total) by mouth daily. (Patient not taking: Reported on 01/24/2019) 30 tablet 3   No facility-administered medications prior to visit.      Review of Systems  HENT: Negative for ear pain, hoarse voice, postnasal drip, rhinorrhea, sneezing, sore throat and trouble swallowing.   Respiratory: Positive for sputum production and wheezing. Negative for cough and shortness of breath.   Cardiovascular: Negative for dyspnea on exertion.  Gastrointestinal: Negative for heartburn.  Musculoskeletal: Negative for myalgias.  Neurological: Negative for headaches.       Objective:   Physical Exam Vitals:   01/24/19 1554  BP: 120/79  Pulse: 81  Temp: 98 F (36.7 C)  TempSrc: Oral  SpO2: 99%  Weight: 181 lb (82.1 kg)  Height: 5\' 2"  (1.575 m)    Gen: Pleasant, well-nourished, in no distress,  normal affect  ENT: No lesions,  mouth clear,  oropharynx clear, no postnasal drip  Neck: No JVD, no TMG, no carotid bruits  Lungs: No use of accessory  muscles, no dullness to percussion, right upper lobe few mild expiratory wheezes otherwise clear  Cardiovascular: RRR, heart sounds normal, no murmur or gallops, no peripheral edema  Abdomen: soft and NT, no HSM,  BS normal  Musculoskeletal: No deformities, no cyanosis or clubbing  Neuro: alert, non focal  Skin: Warm, no lesions or rashes      Assessment & Plan:  I personally reviewed all images and lab data in the [facility] system as well as any outside material  available during this office visit and agree with the  radiology impressions.   Moderate persistent asthma Moderate persistent asthma stable at this time  Plan will be to continue Symbicort 2 puffs twice daily along with Singulair and Zyrtec  I reinforced proper inhaler technique and found that she was inhaling too quickly also gave the patient an AeroChamber today  We will also check a mold assay today  Gastroesophageal reflux disease without esophagitis Gastrosoft reflux disease the patient will continue Protonix and refills were sent I also give the patient a reflux diet   Morrie Sheldonshley was seen today for asthma.  Diagnoses and all orders for this visit:  Moderate persistent asthma without complication -     Allergen Profile, Mold -     montelukast (SINGULAIR) 10 MG tablet; Take 1 tablet (10 mg total) by mouth at bedtime.  Moderate persistent asthma with exacerbation -     Discontinue: ipratropium-albuterol (DUONEB) 0.5-2.5 (3) MG/3ML SOLN; Take 3 mLs by nebulization every 4 (four) hours as needed. For sob. -     ipratropium-albuterol (DUONEB) 0.5-2.5 (3) MG/3ML SOLN; Take 3 mLs by nebulization every 4 (four) hours as needed. For sob.  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 -     cetirizine (ZYRTEC) 10 MG tablet; Take 1 tablet (10 mg total) by mouth daily.  Moderate persistent asthma with acute exacerbation -      albuterol (VENTOLIN HFA) 108 (90 Base) MCG/ACT inhaler; INHALE 2 PUFFS INTO THE LUNGS EVERY 4-6 HOURS AS NEEDED FOR WHEEZING OR SHORTNESS OF BREATH.  Gastroesophageal reflux disease without esophagitis  Other orders -     Spacer/Aero-Holding Chambers (AEROCHAMBER MV) inhaler; Use as instructed -     pantoprazole (PROTONIX) 40 MG tablet; Take 1 tablet (40 mg total) by mouth daily. -     fluticasone (FLONASE) 50 MCG/ACT nasal spray; Place 2 sprays into both nostrils daily.   The patient declined a flu vaccine

## 2019-01-25 MED FILL — MONTELUKAST SOD 10 MG TAB: 10 | 30 days supply | Qty: 30 | Fill #0

## 2019-01-25 MED FILL — ALBUTEROL SULFATE HFA 108 (: 108 (90 BAS | 16 days supply | Qty: 18 | Fill #0

## 2019-01-27 LAB — ALLERGEN PROFILE, MOLD
Alternaria Alternata IgE: <0.1 kU/L
Aspergillus Fumigatus IgE: <0.1 kU/L
Aureobasidi Pullulans IgE: <0.1 kU/L
Candida Albicans IgE: <0.1 kU/L
Cladosporium Herbarum IgE: <0.1 kU/L
M009-IgE Fusarium proliferatum: <0.1 kU/L
M014-IgE Epicoccum purpur: <0.1 kU/L
Mucor Racemosus IgE: <0.1 kU/L
Penicillium Chrysogen IgE: <0.1 kU/L
Phoma Betae IgE: <0.1 kU/L
Setomelanomma Rostrat: <0.1 kU/L
Stemphylium Herbarum IgE: <0.1 kU/L

## 2019-01-28 ENCOUNTER — Encounter: Payer: Self-pay | Admitting: *Deleted

## 2019-01-31 MED FILL — PANTOPRAZOLE SOD DR 40 MG T: 40 | 30 days supply | Qty: 30 | Fill #2

## 2019-01-31 MED FILL — SYMBICORT 160-4.5 MCG INH: 160-4.5 | 30 days supply | Qty: 10 | Fill #2

## 2019-02-14 ENCOUNTER — Encounter: Payer: Self-pay | Admitting: Internal Medicine

## 2019-02-19 MED FILL — ALBUTEROL SULFATE HFA 108 (: 108 (90 BAS | 16 days supply | Qty: 18 | Fill #1

## 2019-02-27 ENCOUNTER — Telehealth: Payer: Self-pay | Admitting: *Deleted

## 2019-02-27 MED FILL — SYMBICORT 160-4.5 MCG INH: 160-4.5 | 30 days supply | Qty: 10 | Fill #3

## 2019-02-27 NOTE — Telephone Encounter (Signed)
Per pt mychart message, she is having Severe cramps, nausea, no appetite all from menstrual. Staff called patient and was not able to reach her to schedule an appt. Office number was provided on voicemail for patient to call office to sch appt.

## 2019-03-06 ENCOUNTER — Ambulatory Visit: Payer: Managed Care, Other (non HMO) | Attending: Physician Assistant | Admitting: Physician Assistant

## 2019-03-06 ENCOUNTER — Other Ambulatory Visit: Payer: Self-pay

## 2019-03-06 VITALS — BP 117/76 | HR 97 | Temp 99.6°F | Ht 62.0 in | Wt 172.0 lb

## 2019-03-06 DIAGNOSIS — E559 Vitamin D deficiency, unspecified: Secondary | ICD-10-CM | POA: Diagnosis not present

## 2019-03-06 DIAGNOSIS — N946 Dysmenorrhea, unspecified: Secondary | ICD-10-CM

## 2019-03-06 DIAGNOSIS — R110 Nausea: Secondary | ICD-10-CM

## 2019-03-06 DIAGNOSIS — Z3202 Encounter for pregnancy test, result negative: Secondary | ICD-10-CM | POA: Diagnosis not present

## 2019-03-06 LAB — POCT URINALYSIS DIP (CLINITEK)
Bilirubin, UA: NEGATIVE
Blood, UA: NEGATIVE
Glucose, UA: NEGATIVE mg/dL
Ketones, POC UA: NEGATIVE mg/dL
Leukocytes, UA: NEGATIVE
Nitrite, UA: NEGATIVE
POC PROTEIN,UA: NEGATIVE
Spec Grav, UA: 1.015 (ref 1.010–1.025)
Urobilinogen, UA: 1 E.U./dL
pH, UA: 6 (ref 5.0–8.0)

## 2019-03-06 LAB — POCT URINE PREGNANCY: Preg Test, Ur: NEGATIVE

## 2019-03-06 MED ORDER — ONDANSETRON HCL 8 MG PO TABS: 8.0000 mg | ORAL_TABLET | Freq: Three times a day (TID) | ORAL | 0 refills | Status: DC | PRN

## 2019-03-06 MED ORDER — IBUPROFEN 600 MG PO TABS: 600.0000 mg | ORAL_TABLET | Freq: Three times a day (TID) | ORAL | 1 refills | Status: AC | PRN

## 2019-03-06 MED FILL — ONDANSETRON HCL 8 MG TABLET: 8 | 6 days supply | Qty: 18 | Fill #0

## 2019-03-06 MED FILL — IBUPROFEN 600 MG TABLET: 600 | 30 days supply | Qty: 90 | Fill #0

## 2019-03-06 NOTE — Progress Notes (Signed)
Patient ID: Dawn Robertson, female   DOB: 11/12/1985, 33 y.o.   MRN: [id number]       Saylah Ketner, is a 33 y.o. female  QMG:[id number]  TOI:[id number]  DOB - [birth year]  Subjective:  Chief Complaint and HPI: Aliya Sol is a 33 y.o. female here today  With painful periods.  Severe cramping and nausea X first 2 days of cycle.  This has been going on for about 1 year.  Periods are regular.  LMP 2-3 weeks ago.  Not SA.  Last pap 07/2015.  Doesn't like to take meds.  Cramping with periods so bad that sometimes she misses work.  Doesn't want to be on [facility].  Denies vaginal discharge or urinary s/sx.  No f/c.     ROS:   Constitutional:  No f/c, No night sweats, No unexplained weight loss. EENT:  No vision changes, No blurry vision, No hearing changes. No mouth, throat, or ear problems.  Respiratory: No cough, No SOB Cardiac: No CP, no palpitations GI:  No abd pain, No N/V/D. GU: No Urinary s/sx Musculoskeletal: No joint pain Neuro: No headache, no dizziness, no motor weakness.  Skin: No rash Endocrine:  No polydipsia. No polyuria.  Psych: Denies SI/HI  No problems updated.  ALLERGIES: No Known Allergies  PAST MEDICAL HISTORY: Past Medical History:  Diagnosis Date  . Asthma    seasonal  . Seasonal allergies   . Shortness of breath   . Tobacco abuse     MEDICATIONS AT HOME: Prior to Admission medications   Medication Sig Start Date End Date Taking? Authorizing Provider  albuterol (VENTOLIN HFA) 108 (90 Base) MCG/ACT inhaler INHALE 2 PUFFS INTO THE LUNGS EVERY 4-6 HOURS AS NEEDED FOR WHEEZING OR SHORTNESS OF BREATH. 01/24/19  Yes [name], MD  cetirizine (ZYRTEC) 10 MG tablet Take 1 tablet (10 mg total) by mouth daily. 01/24/19  Yes [name], MD  fluticasone (FLONASE) 50 MCG/ACT nasal spray Place 2 sprays into both nostrils daily. 01/24/19  Yes [name], MD  ipratropium-albuterol (DUONEB) 0.5-2.5 (3) MG/3ML SOLN Take 3 mLs by nebulization  every 4 (four) hours as needed. For sob. 01/24/19  Yes [name], MD  montelukast (SINGULAIR) 10 MG tablet Take 1 tablet (10 mg total) by mouth at bedtime. 01/24/19  Yes [name], MD  pantoprazole (PROTONIX) 40 MG tablet Take 1 tablet (40 mg total) by mouth daily. 01/24/19  Yes [name], MD  Spacer/Aero-Holding Chambers (AEROCHAMBER MV) inhaler Use as instructed 01/24/19  Yes [name], MD  SYMBICORT 160-4.5 MCG/ACT inhaler INHALE 2 PUFFS INTO THE LUNGS 2 (TWO) TIMES DAILY. 12/12/18  Yes [name], MD  ibuprofen (ADVIL) 600 MG tablet Take 1 tablet (600 mg total) by mouth every 8 (eight) hours as needed. X2 days prior to menses and 2 days into cycle 03/06/19   Georgian Co M, PA-C  ondansetron (ZOFRAN) 8 MG tablet Take 1 tablet (8 mg total) by mouth every 8 (eight) hours as needed for nausea or vomiting. 03/06/19   Anders Simmonds, PA-C     Objective:  EXAM:   Vitals:   03/06/19 1610  BP: 117/76  Pulse: 97  Temp: 99.6 F (37.6 C)  TempSrc: Oral  SpO2: 96%  Weight: 172 lb (78 kg)  Height: 5\' 2"  (1.575 m)    General appearance : A&OX3. NAD. Non-toxic-appearing HEENT: Atraumatic and Normocephalic.  PERRLA. EOM intact.  TChest/Lungs:  Breathing-non-labored, Good air entry bilaterally, breath sounds  normal without rales, rhonchi, or wheezing  CVS: S1 S2 regular, no murmurs, gallops, rubs  Extremities: Bilateral Lower Ext shows no edema, both legs are warm to touch with = pulse throughout Neurology:  CN II-XII grossly intact, Non focal.   Psych:  TP linear. J/I WNL. Normal speech. Appropriate eye contact and affect.  Skin:  No Rash  Data Review Lab Results  Component Value Date   HGBA1C 4.7 08/17/2016   HGBA1C 4 5 06/08/2015     Assessment & Plan   1. Nausea With periods - POCT URINALYSIS DIP (CLINITEK) - POCT urine pregnancy - ondansetron (ZOFRAN) 8 MG tablet; Take 1 tablet (8 mg total) by mouth every 8 (eight) hours as needed for  nausea or vomiting.  Dispense: 30 tablet; Refill: 0 - Comprehensive metabolic panel - CBC with Differential  2. Dysmenorrhea - ibuprofen (ADVIL) 600 MG tablet; Take 1 tablet (600 mg total) by mouth every 8 (eight) hours as needed. X2 days prior to menses and 2 days into cycle  Dispense: 90 tablet; Refill: 1 - Comprehensive metabolic panel - CBC with Differential - TSH - Ambulatory referral to Gynecology  3. Vitamin D deficiency - Vitamin D, 25-hydroxy   Patient have been counseled extensively about nutrition and exercise  Return in about 6 months (around 09/04/2019) for pcp for chronic conditions.  The patient was given clear instructions to go to ER or return to medical center if symptoms don't improve, worsen or new problems develop. The patient verbalized understanding. The patient was told to call to get lab results if they haven't heard anything in the next week.     Freeman Caldron, PA-C [facility] and [facility], [place]   03/06/2019, 4:50 PM

## 2019-03-06 NOTE — Patient Instructions (Signed)
Dysmenorrhea Dysmenorrhea means painful cramps during your period (menstrual period). You will have pain in your lower belly (abdomen). The pain is caused by the tightening (contracting) of the muscles of the womb (uterus). The pain may be mild or very bad. With this condition, you may:  Have a headache.  Feel sick to your stomach (nauseous).  Throw up (vomit).  Have lower back pain. Follow these instructions at home: Helping pain and cramping   Put heat on your lower back or belly when you have pain or cramps. Use the heat source that your doctor tells you to use. ? Place a towel between your skin and the heat. ? Leave the heat on for 20-30 minutes. ? Remove the heat if your skin turns bright red. This is especially important if you cannot feel pain, heat, or cold. ? Do not have a heating pad on during sleep.  Do aerobic exercises. These include walking, swimming, or biking. These may help with cramps.  Massage your lower back or belly. This may help lessen pain. General instructions  Take over-the-counter and prescription medicines only as told by your doctor.  Do not drive or use heavy machinery while taking prescription pain medicine.  Avoid alcohol and caffeine during and right before your period. These can make cramps worse.  Do not use any products that have nicotine or tobacco. These include cigarettes and e-cigarettes. If you need help quitting, ask your doctor.  Keep all follow-up visits as told by your doctor. This is important. Contact a doctor if:  You have pain that gets worse.  You have pain that does not get better with medicine.  You have pain during sex.  You feel sick to your stomach or you throw up during your period, and medicine does not help. Get help right away if:  You pass out (faint). Summary  Dysmenorrhea means painful cramps during your period (menstrual period).  Put heat on your lower back or belly when you have pain or cramps.  Do  exercises like walking, swimming, or biking to help with cramps.  Contact a doctor if you have pain during sex. This information is not intended to replace advice given to you by your health care provider. Make sure you discuss any questions you have with your health care provider. Document Released: 06/10/2008 Document Revised: 02/24/2017 Document Reviewed: 03/31/2016 Elsevier Patient Education  2020 [employer]  

## 2019-03-06 NOTE — Progress Notes (Signed)
Pt. Stated she would get cramps and they make her feel nauseous, and bad back pain.

## 2019-03-07 ENCOUNTER — Other Ambulatory Visit: Payer: Self-pay | Admitting: Physician Assistant

## 2019-03-07 LAB — CBC WITH DIFFERENTIAL/PLATELET
Basophils Absolute: 0.1 10*3/uL (ref 0.0–0.2)
Basos: 1 %
EOS (ABSOLUTE): 0.2 10*3/uL (ref 0.0–0.4)
Eos: 3 %
Hematocrit: 45.3 % (ref 34.0–46.6)
Hemoglobin: 15.2 g/dL (ref 11.1–15.9)
Immature Grans (Abs): 0 10*3/uL (ref 0.0–0.1)
Immature Granulocytes: 0 %
Lymphocytes Absolute: 3.5 10*3/uL — ABNORMAL HIGH (ref 0.7–3.1)
Lymphs: 44 %
MCH: 33.7 pg — ABNORMAL HIGH (ref 26.6–33.0)
MCHC: 33.6 g/dL (ref 31.5–35.7)
MCV: 100 fL — ABNORMAL HIGH (ref 79–97)
Monocytes Absolute: 0.9 10*3/uL (ref 0.1–0.9)
Monocytes: 11 %
Neutrophils Absolute: 3.3 10*3/uL (ref 1.4–7.0)
Neutrophils: 41 %
Platelets: 242 10*3/uL (ref 150–450)
RBC: 4.51 x10E6/uL (ref 3.77–5.28)
RDW: 11.5 % — ABNORMAL LOW (ref 11.7–15.4)
WBC: 8 10*3/uL (ref 3.4–10.8)

## 2019-03-07 LAB — COMPREHENSIVE METABOLIC PANEL
ALT: 9 IU/L (ref 0–32)
AST: 11 IU/L (ref 0–40)
Albumin/Globulin Ratio: 1.2 (ref 1.2–2.2)
Albumin: 4.3 g/dL (ref 3.8–4.8)
Alkaline Phosphatase: 60 IU/L (ref 39–117)
BUN/Creatinine Ratio: 13 (ref 9–23)
BUN: 9 mg/dL (ref 6–20)
Bilirubin Total: 0.5 mg/dL (ref 0.0–1.2)
CO2: 22 mmol/L (ref 20–29)
Calcium: 9.9 mg/dL (ref 8.7–10.2)
Chloride: 102 mmol/L (ref 96–106)
Creatinine, Ser: 0.71 mg/dL (ref 0.57–1.00)
GFR calc Af Amer: 129 mL/min/{1.73_m2} (ref >59)
GFR calc non Af Amer: 111 mL/min/{1.73_m2} (ref >59)
Globulin, Total: 3.6 g/dL (ref 1.5–4.5)
Glucose: 90 mg/dL (ref 65–99)
Potassium: 4 mmol/L (ref 3.5–5.2)
Sodium: 136 mmol/L (ref 134–144)
Total Protein: 7.9 g/dL (ref 6.0–8.5)

## 2019-03-07 LAB — TSH: TSH: 1.12 u[IU]/mL (ref 0.450–4.500)

## 2019-03-07 LAB — VITAMIN D 25 HYDROXY (VIT D DEFICIENCY, FRACTURES): Vit D, 25-Hydroxy: 6.3 ng/mL — ABNORMAL LOW (ref 30.0–100.0)

## 2019-03-07 MED ORDER — VITAMIN D (ERGOCALCIFEROL) 1.25 MG (50000 UNIT) PO CAPS: 50000.0000 [IU] | ORAL_CAPSULE | ORAL | 0 refills | Status: AC

## 2019-03-07 MED FILL — VIT D2 1.25 MG (50,000 UNIT: 1.25 MG | 28 days supply | Qty: 4 | Fill #0

## 2019-03-18 MED FILL — ALBUTEROL SULFATE HFA 108 (: 108 (90 BAS | 16 days supply | Qty: 18 | Fill #2

## 2019-03-25 MED FILL — SYMBICORT 160-4.5 MCG INH: 160-4.5 | 30 days supply | Qty: 10 | Fill #4

## 2019-04-12 MED FILL — ALBUTEROL SULFATE HFA 108 (: 108 (90 BAS | 16 days supply | Qty: 18 | Fill #8

## 2019-04-18 ENCOUNTER — Ambulatory Visit (INDEPENDENT_AMBULATORY_CARE_PROVIDER_SITE_OTHER): Payer: BC Managed Care – PPO | Admitting: Obstetrics & Gynecology

## 2019-04-18 ENCOUNTER — Other Ambulatory Visit: Payer: Self-pay

## 2019-04-18 ENCOUNTER — Encounter: Payer: Self-pay | Admitting: Obstetrics & Gynecology

## 2019-04-18 VITALS — BP 132/97 | HR 88 | Wt 175.4 lb

## 2019-04-18 DIAGNOSIS — N946 Dysmenorrhea, unspecified: Secondary | ICD-10-CM | POA: Diagnosis not present

## 2019-04-18 DIAGNOSIS — Z1151 Encounter for screening for human papillomavirus (HPV): Secondary | ICD-10-CM

## 2019-04-18 DIAGNOSIS — Z124 Encounter for screening for malignant neoplasm of cervix: Secondary | ICD-10-CM

## 2019-04-18 DIAGNOSIS — Z01419 Encounter for gynecological examination (general) (routine) without abnormal findings: Secondary | ICD-10-CM

## 2019-04-18 DIAGNOSIS — N944 Primary dysmenorrhea: Secondary | ICD-10-CM

## 2019-04-18 MED ORDER — NORGESTIM-ETH ESTRAD TRIPHASIC 0.18/0.215/0.25 MG-25 MCG PO TABS: 1.0000 | ORAL_TABLET | Freq: Every day | ORAL | 11 refills | Status: DC

## 2019-04-18 NOTE — Patient Instructions (Signed)
Oral Contraception Information Oral contraceptive pills (OCPs) are medicines taken to prevent pregnancy. OCPs are taken by mouth, and they work by:  Preventing the ovaries from releasing eggs.  Thickening mucus in the lower part of the uterus (cervix), which prevents sperm from entering the uterus.  Thinning the lining of the uterus (endometrium), which prevents a fertilized egg from attaching to the endometrium. OCPs are highly effective when taken exactly as prescribed. However, OCPs do not prevent STIs (sexually transmitted infections). Safe sex practices, such as using condoms while on an OCP, can help prevent STIs. Before starting OCPs Before you start taking OCPs, you may have a physical exam, blood test, and Pap test. However, you are not required to have a pelvic exam in order to be prescribed OCPs. Your health care provider will make sure you are a good candidate for oral contraception. OCPs are not a good option for certain women, including women who smoke and are older than 35 years, and women with a medical history of high blood pressure, deep vein thrombosis, pulmonary embolism, stroke, cardiovascular disease, or peripheral vascular disease. Discuss with your health care provider the possible side effects of the OCP you may be prescribed. When you start an OCP, be aware that it can take 2-3 months for your body to adjust to changes in hormone levels. Follow instructions from your health care provider about how to start taking your first cycle of OCPs. Depending on when you start the pill, you may need to use a backup form of birth control, such as condoms, during the first week. Make sure you know what steps to take if you ever forget to take the pill. Types of oral contraception  The most common types of birth control pills contain the hormones estrogen and progestin (synthetic progesterone) or progestin only. The combination pill This type of pill contains estrogen and progestin  hormones. Combination pills often come in packs of 21, 28, or 91 pills. For each pack, the last 7 pills may not contain hormones, which means you may stop taking the pills for 7 days. Menstrual bleeding occurs during the week that you do not take the pills or that you take the pills with no hormones in them. The minipill This type of pill contains the progestin hormone only. It comes in packs of 28 pills. All 28 pills contain the hormone. You take the pill every day. It is very important to take the pill at the same time each day. Advantages of oral contraceptive pills  Provides reliable and continuous contraception if taken as instructed.  May treat or decrease symptoms of: ? Menstrual period cramps. ? Irregular menstrual cycle or bleeding. ? Heavy menstrual flow. ? Abnormal uterine bleeding. ? Acne, depending on the type of pill. ? Polycystic ovarian syndrome. ? Endometriosis. ? Iron deficiency anemia. ? Premenstrual symptoms, including premenstrual dysphoric disorder.  May reduce the risk of endometrial and ovarian cancer.  Can be used as emergency contraception.  Prevents mislocated (ectopic) pregnancies and infections of the fallopian tubes. Things that can make oral contraceptive pills less effective OCPs can be less effective if:  You forget to take the pill at the same time every day. This is especially important when taking the minipill.  You have a stomach or intestinal disease that reduces your body's ability to absorb the pill.  You take OCPs with other medicines that make OCPs less effective, such as antibiotics, certain HIV medicines, and some seizure medicines.  You take expired OCPs.    You forget to restart the pill on day 7, if using the packs of 21 pills. Risks associated with oral contraceptive pills Oral contraceptive pills can sometimes cause side effects, such as:  Headache.  Depression.  Trouble sleeping.  Nausea and vomiting.  Breast  tenderness.  Irregular bleeding or spotting during the first several months.  Bloating or fluid retention.  Increase in blood pressure. Combination pills are also associated with a small increase in the risk of:  Blood clots.  Heart attack.  Stroke. Summary  Oral contraceptive pills are medicines taken by mouth to prevent pregnancy. They are highly effective when taken exactly as prescribed.  The most common types of birth control pills contain the hormones estrogen and progestin (synthetic progesterone) or progestin only.  Before you start taking the pill, you may have a physical exam, blood test, and Pap test. Your health care provider will make sure you are a good candidate for oral contraception.  The combination pill may come in a 21-day pack, a 28-day pack, or a 91-day pack. The minipill contains the progesterone hormone only and comes in packs of 28 pills.  Oral contraceptive pills can sometimes cause side effects, such as headache, nausea, breast tenderness, or irregular bleeding. This information is not intended to replace advice given to you by your health care provider. Make sure you discuss any questions you have with your health care provider. Document Revised: 02/24/2017 Document Reviewed: 06/07/2016 Elsevier Patient Education  2020 [employer] Inc. Dysmenorrhea Dysmenorrhea means painful cramps during your period (menstrual period). You will have pain in your lower belly (abdomen). The pain is caused by the tightening (contracting) of the muscles of the womb (uterus). The pain may be mild or very bad. With this condition, you may:  Have a headache.  Feel sick to your stomach (nauseous).  Throw up (vomit).  Have lower back pain. Follow these instructions at home: Helping pain and cramping   Put heat on your lower back or belly when you have pain or cramps. Use the heat source that your doctor tells you to use. ? Place a towel between your skin and the  heat. ? Leave the heat on for 20-30 minutes. ? Remove the heat if your skin turns bright red. This is especially important if you cannot feel pain, heat, or cold. ? Do not have a heating pad on during sleep.  Do aerobic exercises. These include walking, swimming, or biking. These may help with cramps.  Massage your lower back or belly. This may help lessen pain. General instructions  Take over-the-counter and prescription medicines only as told by your doctor.  Do not drive or use heavy machinery while taking prescription pain medicine.  Avoid alcohol and caffeine during and right before your period. These can make cramps worse.  Do not use any products that have nicotine or tobacco. These include cigarettes and e-cigarettes. If you need help quitting, ask your doctor.  Keep all follow-up visits as told by your doctor. This is important. Contact a doctor if:  You have pain that gets worse.  You have pain that does not get better with medicine.  You have pain during sex.  You feel sick to your stomach or you throw up during your period, and medicine does not help. Get help right away if:  You pass out (faint). Summary  Dysmenorrhea means painful cramps during your period (menstrual period).  Put heat on your lower back or belly when you have pain or cramps.  Do  exercises like walking, swimming, or biking to help with cramps.  Contact a doctor if you have pain during sex. This information is not intended to replace advice given to you by your health care provider. Make sure you discuss any questions you have with your health care provider. Document Revised: 02/24/2017 Document Reviewed: 03/31/2016 Elsevier Patient Education  [place].

## 2019-04-18 NOTE — Progress Notes (Signed)
Patient ID: Dawn Robertson, female   DOB: July 03, 1985, 34 y.o.   MRN: [id number]  Chief Complaint  Patient presents with  . Dysmenorrhea    HPI Dawn Robertson is a 34 y.o. female.  [id number] Patient's last menstrual period was 04/10/2019 (exact date). She has a long history of painful menses for the first 1-2 days, nausea and vomiting, not well controlled with ibuprofen and Zofran. Never sexually active and does not want childbearing. She has never had hormonal management of her problem HPI  Past Medical History:  Diagnosis Date  . Asthma    seasonal  . Seasonal allergies   . Shortness of breath   . Tobacco abuse     No past surgical history on file.  Family History  Problem Relation Age of Onset  . Asthma Mother   . Breast cancer Mother 44       s/p surgery and radiation  . Other Mother        hx of hysterectomy for bleeding/blood clotting in her 60s  . Asthma Father   . Diabetes type II Paternal Aunt   . Prostate cancer Other        maternal great uncle (MGM's brother) d. later age    Social History Social History   Tobacco Use  . Smoking status: Former Smoker    Types: Cigarettes    Quit date: 03/28/2013    Years since quitting: 6.0  . Smokeless tobacco: Never Used  . Tobacco comment: hx of 1 pk every 3 days  Substance Use Topics  . Alcohol use: Yes    Alcohol/week: 0.0 standard drinks    Comment: daily glass of wine  . Drug use: No    No Known Allergies  Current Outpatient Medications  Medication Sig Dispense Refill  . albuterol (VENTOLIN HFA) 108 (90 Base) MCG/ACT inhaler INHALE 2 PUFFS INTO THE LUNGS EVERY 4-6 HOURS AS NEEDED FOR WHEEZING OR SHORTNESS OF BREATH. 18 g 2  . cetirizine (ZYRTEC) 10 MG tablet Take 1 tablet (10 mg total) by mouth daily. 30 tablet 6  . fluticasone (FLONASE) 50 MCG/ACT nasal spray Place 2 sprays into both nostrils daily. 16 g 6  . ibuprofen (ADVIL) 600 MG tablet Take 1 tablet (600 mg total) by mouth every 8 (eight) hours  as needed. X2 days prior to menses and 2 days into cycle 90 tablet 1  . ipratropium-albuterol (DUONEB) 0.5-2.5 (3) MG/3ML SOLN Take 3 mLs by nebulization every 4 (four) hours as needed. For sob. 360 mL 2  . montelukast (SINGULAIR) 10 MG tablet Take 1 tablet (10 mg total) by mouth at bedtime. 30 tablet 6  . ondansetron (ZOFRAN) 8 MG tablet Take 1 tablet (8 mg total) by mouth every 8 (eight) hours as needed for nausea or vomiting. 30 tablet 0  . pantoprazole (PROTONIX) 40 MG tablet Take 1 tablet (40 mg total) by mouth daily. 30 tablet 3  . Spacer/Aero-Holding Chambers (AEROCHAMBER MV) inhaler Use as instructed 1 each 0  . SYMBICORT 160-4.5 MCG/ACT inhaler INHALE 2 PUFFS INTO THE LUNGS 2 (TWO) TIMES DAILY. 10.2 g 6  . Vitamin D, Ergocalciferol, (DRISDOL) 1.25 MG (50000 UT) CAPS capsule Take 1 capsule (50,000 Units total) by mouth every 7 (seven) days. 16 capsule 0  . Norgestimate-Ethinyl Estradiol Triphasic (ORTHO TRI-CYCLEN LO) 0.18/0.215/0.25 MG-25 MCG tab Take 1 tablet by mouth daily. 1 Package 11   No current facility-administered medications for this visit.    Review of Systems Review of Systems  Constitutional:  Negative.   Gastrointestinal: Positive for nausea and vomiting.  Genitourinary: Positive for pelvic pain and vaginal bleeding. Negative for vaginal discharge.    Blood pressure (!) 132/97, pulse 88, weight 175 lb 6.4 oz (79.6 kg), last menstrual period 04/10/2019.  Physical Exam Physical Exam Vitals and nursing note reviewed. Exam conducted with a chaperone present.  Constitutional:      Appearance: Normal appearance. She is ill-appearing.  Pulmonary:     Effort: Pulmonary effort is normal.  Abdominal:     General: Abdomen is flat.     Palpations: Abdomen is soft. There is no mass.  Genitourinary:    General: Normal vulva.     Comments: Pelvic exam: normal external genitalia, vulva, vagina, cervix, uterus and adnexa, pap done.  Musculoskeletal:     Cervical back: Normal  range of motion.  Neurological:     Mental Status: She is alert.   Breasts: breasts appear normal, no suspicious masses, no skin or nipple changes or axillary nodes.  Primary dysmenorrhea Not controlled with current medications, candidate for hormonal cycle control Plan Ortho tricyclen Rx- discussed with patient and she is willing to try RTC 4 months Pelvic US    Emeterio Reeve 04/18/2019, 4:32 PM

## 2019-04-19 MED FILL — NORG-EE 0.18-0.215-0.25/0.0: 0.18/0.215/ | 28 days supply | Qty: 28 | Fill #0

## 2019-04-19 MED FILL — SYMBICORT 160-4.5 MCG INH: 160-4.5 | 30 days supply | Qty: 10 | Fill #5

## 2019-04-22 LAB — CYTOLOGY - PAP
Adequacy: ABSENT
Comment: NEGATIVE
Diagnosis: NEGATIVE
High risk HPV: NEGATIVE

## 2019-04-22 MED FILL — MONTELUKAST SOD 10 MG TAB: 10 | 30 days supply | Qty: 30 | Fill #1

## 2019-05-09 MED FILL — VENTOLIN HFA 90 MCG INHALER: 108 (90 BAS | 16 days supply | Qty: 18 | Fill #9

## 2019-05-15 ENCOUNTER — Telehealth: Payer: Self-pay | Admitting: Internal Medicine

## 2019-05-15 MED FILL — SYMBICORT 160-4.5 MCG INH: 160-4.5 | 30 days supply | Qty: 10 | Fill #6

## 2019-05-15 NOTE — Telephone Encounter (Signed)
Called patient to reschedule appointment, LVM

## 2019-05-20 MED FILL — TRI-LO-SPRINTEC TABLET: 0.18/0.215/ | 28 days supply | Qty: 28 | Fill #1

## 2019-05-28 NOTE — Telephone Encounter (Signed)
Called the pt lvm

## 2019-05-28 NOTE — Telephone Encounter (Signed)
-----   Message from [name], MD sent at 05/28/2019 12:10 PM EST ----- This pt needs to be rescheduled with me next week   ok to double book

## 2019-05-29 ENCOUNTER — Ambulatory Visit: Payer: BC Managed Care – PPO | Admitting: Critical Care Medicine

## 2019-05-30 ENCOUNTER — Ambulatory Visit: Payer: Managed Care, Other (non HMO) | Admitting: Critical Care Medicine

## 2019-06-04 ENCOUNTER — Ambulatory Visit: Payer: BC Managed Care – PPO | Attending: Critical Care Medicine | Admitting: Critical Care Medicine

## 2019-06-04 ENCOUNTER — Other Ambulatory Visit: Payer: Self-pay

## 2019-06-04 ENCOUNTER — Encounter: Payer: Self-pay | Admitting: Critical Care Medicine

## 2019-06-04 VITALS — BP 127/79 | HR 96 | Temp 98.5°F | Resp 18 | Ht 65.0 in | Wt 182.0 lb

## 2019-06-04 DIAGNOSIS — E669 Obesity, unspecified: Secondary | ICD-10-CM | POA: Diagnosis not present

## 2019-06-04 DIAGNOSIS — Z6834 Body mass index (BMI) 34.0-34.9, adult: Secondary | ICD-10-CM

## 2019-06-04 DIAGNOSIS — J4541 Moderate persistent asthma with (acute) exacerbation: Secondary | ICD-10-CM

## 2019-06-04 DIAGNOSIS — J4540 Moderate persistent asthma, uncomplicated: Secondary | ICD-10-CM

## 2019-06-04 DIAGNOSIS — K219 Gastro-esophageal reflux disease without esophagitis: Secondary | ICD-10-CM

## 2019-06-04 MED ORDER — FLUTICASONE PROPIONATE 50 MCG/ACT NA SUSP: 2.0000 | Freq: Every day | NASAL | 6 refills | Status: DC

## 2019-06-04 MED ORDER — PANTOPRAZOLE SODIUM 40 MG PO TBEC: 40.0000 mg | DELAYED_RELEASE_TABLET | Freq: Every day | ORAL | 3 refills | Status: DC

## 2019-06-04 MED ORDER — FLUTICASONE-SALMETEROL 250-50 MCG/DOSE IN AEPB: 1.0000 | INHALATION_SPRAY | Freq: Two times a day (BID) | RESPIRATORY_TRACT | 2 refills | Status: DC

## 2019-06-04 MED ORDER — MONTELUKAST SODIUM 10 MG PO TABS: 10.0000 mg | ORAL_TABLET | Freq: Every day | ORAL | 6 refills | Status: DC

## 2019-06-04 MED ORDER — PREDNISONE 10 MG PO TABS: ORAL_TABLET | ORAL | 0 refills | Status: DC

## 2019-06-04 MED ORDER — PROAIR RESPICLICK 108 (90 BASE) MCG/ACT IN AEPB: 2.0000 | INHALATION_SPRAY | Freq: Four times a day (QID) | RESPIRATORY_TRACT | 4 refills | Status: DC | PRN

## 2019-06-04 MED ORDER — ALBUTEROL SULFATE HFA 108 (90 BASE) MCG/ACT IN AERS: 2.0000 | INHALATION_SPRAY | RESPIRATORY_TRACT | 3 refills | Status: DC | PRN

## 2019-06-04 MED FILL — PANTOPRAZOLE SOD DR 40 MG T: 40 | 30 days supply | Qty: 30 | Fill #0

## 2019-06-04 MED FILL — ALBUTEROL SULFATE HFA 108 (: 108 (90 BAS | 16 days supply | Qty: 9 | Fill #0

## 2019-06-04 MED FILL — ADVAIR 250/50 DISKUS: 250-50 | 30 days supply | Qty: 60 | Fill #0

## 2019-06-04 MED FILL — FLUTICASONE PROP 50 MCG SPR: 50 | 30 days supply | Qty: 16 | Fill #0

## 2019-06-04 MED FILL — predniSONE 10 MG TABS: 10 | 5 days supply | Qty: 20 | Fill #0

## 2019-06-04 MED FILL — MONTELUKAST SOD 10 MG TAB: 10 | 30 days supply | Qty: 30 | Fill #0

## 2019-06-04 NOTE — Patient Instructions (Addendum)
Discontinue Symbicort  Begin Advair 250 at 1 inhalation twice daily, this has a lower co-pay for you  I switch you to proair 2 puffs every 4-6 hours as needed this is your rescue inhaler to replace the Ventolin, this has a lower co-pay  Refill Flonase and also obtain cetirizine, you may find a less expensive form of cetirizine at [facility] [employer] or [facility] brands  I refilled Protonix 40 mg daily and follow the reflux diet as outlined below very strictly  Return to see Dr. Delford Field 6 weeks   Food Choices for Gastroesophageal Reflux Disease, Adult When you have gastroesophageal reflux disease (GERD), the foods you eat and your eating habits are very important. Choosing the right foods can help ease your discomfort. Think about working with a nutrition specialist (dietitian) to help you make good choices. What are tips for following this plan?  Meals  Choose healthy foods that are low in fat, such as fruits, vegetables, whole grains, low-fat dairy products, and lean meat, fish, and poultry.  Eat small meals often instead of 3 large meals a day. Eat your meals slowly, and in a place where you are relaxed. Avoid bending over or lying down until 2-3 hours after eating.  Avoid eating meals 2-3 hours before bed.  Avoid drinking a lot of liquid with meals.  Cook foods using methods other than frying. Bake, grill, or broil food instead.  Avoid or limit: ? Chocolate. ? Peppermint or spearmint. ? Alcohol. ? Pepper. ? Black and decaffeinated coffee. ? Black and decaffeinated tea. ? Bubbly (carbonated) soft drinks. ? Caffeinated energy drinks and soft drinks.  Limit high-fat foods such as: ? Fatty meat or fried foods. ? Whole milk, cream, butter, or ice cream. ? Nuts and nut butters. ? Pastries, donuts, and sweets made with butter or shortening.  Avoid foods that cause symptoms. These foods may be different for everyone. Common foods that cause symptoms include: ? Tomatoes. ? Oranges,  lemons, and limes. ? Peppers. ? Spicy food. ? Onions and garlic. ? Vinegar. Lifestyle  Maintain a healthy weight. Ask your doctor what weight is healthy for you. If you need to lose weight, work with your doctor to do so safely.  Exercise for at least 30 minutes for 5 or more days each week, or as told by your doctor.  Wear loose-fitting clothes.  Do not smoke. If you need help quitting, ask your doctor.  Sleep with the head of your bed higher than your feet. Use a wedge under the mattress or blocks under the bed frame to raise the head of the bed. Summary  When you have gastroesophageal reflux disease (GERD), food and lifestyle choices are very important in easing your symptoms.  Eat small meals often instead of 3 large meals a day. Eat your meals slowly, and in a place where you are relaxed.  Limit high-fat foods such as fatty meat or fried foods.  Avoid bending over or lying down until 2-3 hours after eating.  Avoid peppermint and spearmint, caffeine, alcohol, and chocolate. This information is not intended to replace advice given to you by your health care provider. Make sure you discuss any questions you have with your health care provider. Document Revised: 07/05/2018 Document Reviewed: 04/19/2016 Elsevier Patient Education  2020 [employer].

## 2019-06-04 NOTE — Progress Notes (Signed)
Subjective:    Patient ID: Dawn Robertson, female    DOB: January 11, 1986, 34 y.o.   MRN: [id number]  Is a 34 year old female with lifelong asthma seen in follow-up after previous telephone visit.  Overall she is improved.  She had previously been seen in March for severe persistent asthma and at that time we gave the patient a course of prednisone.  She is now maintaining the Symbicort 2 inhalations twice daily.  The patient also now is on Protonix daily and following a reflux diet and her reflux symptoms are markedly improved.  She does maintain the Singulair Flonase and Zyrtec.  The patient is back at work and is not concerned about coronavirus at this time   01/24/2019 The patient continues to have up-and-down asthma type symptoms but overall is improved since she moved out a month ago of a home that had significant black mold infestation.  The patient continue Symbicort twice daily and albuterol as needed along with the Singulair Flonase and Zyrtec.  The patient has been off Protonix for about a month and noted increased reflux symptoms.  She is off cigarettes at this time.  She is due a flu vaccine but she declined that at this visit.   06/04/2019 This is a pleasant female seen today for severe persistent asthma and she states that she has had increased reflux symptoms and increased wheezing.  Note the patient has [employer] and it will not cover the standard HFA long-acting beta agonist and steroid inhalers.  She was on Symbicort and when she obtained a [employer] her co-pay increased over $100.  She is on the Singulair and Zyrtec.  The patient does have an AeroChamber.  We did check a mold assay and the IgE level was mildly elevated all the mold assay was negative.  Nevertheless she was in the moldy apartment has moved out of that and will soon move into a new house.  She is on the Protonix daily.  She is not following a reflux diet closely.  She states she has  high level acid daily.  She cannot sleep at night because of the coughing and wheezing.   Asthma She complains of cough, difficulty breathing, frequent throat clearing, shortness of breath, sputum production and wheezing. There is no chest tightness, hemoptysis or hoarse voice. Primary symptoms comments: Mucus is yellow white. This is a chronic problem. The current episode started more than 1 year ago. Episode frequency: two attacks this past month. The problem has been gradually worsening. The cough is productive of sputum, productive and nocturnal. Associated symptoms include heartburn and rhinorrhea. Pertinent negatives include no dyspnea on exertion, ear congestion, ear pain, headaches, myalgias, nasal congestion, postnasal drip, sneezing, sore throat or trouble swallowing. Associated symptoms comments: GERD Scratchy throat. Her symptoms are aggravated by change in weather, pollen, URI and lying down. Her symptoms are alleviated by beta-agonist, steroid inhaler and oral steroids. She reports significant improvement on treatment. Her past medical history is significant for asthma. There is no history of pneumonia.   Past Medical History:  Diagnosis Date  . Asthma    seasonal  . Seasonal allergies   . Shortness of breath   . Tobacco abuse      Family History  Problem Relation Age of Onset  . Asthma Mother   . Breast cancer Mother 6       s/p surgery and radiation  . Other Mother        hx  of hysterectomy for bleeding/blood clotting in her 22s  . Asthma Father   . Diabetes type II Paternal Aunt   . Prostate cancer Other        maternal great uncle (MGM's brother) d. later age     Social History   Socioeconomic History  . Marital status: Single    Spouse name: Not on file  . Number of children: Not on file  . Years of education: Not on file  . Highest education level: Not on file  Occupational History  . Not on file  Tobacco Use  . Smoking status: Former Smoker    Types:  Cigarettes    Quit date: 03/28/2013    Years since quitting: 6.1  . Smokeless tobacco: Never Used  . Tobacco comment: hx of 1 pk every 3 days  Substance and Sexual Activity  . Alcohol use: Yes    Alcohol/week: 0.0 standard drinks    Comment: daily glass of wine  . Drug use: No  . Sexual activity: Not Currently    Birth control/protection: None  Other Topics Concern  . Not on file  Social History Narrative  . Not on file   Social Determinants of Health   Financial Resource Strain:   . Difficulty of Paying Living Expenses: Not on file  Food Insecurity:   . Worried About Programme researcher, broadcasting/film/video in the Last Year: Not on file  . Ran Out of Food in the Last Year: Not on file  Transportation Needs:   . Lack of Transportation (Medical): Not on file  . Lack of Transportation (Non-Medical): Not on file  Physical Activity:   . Days of Exercise per Week: Not on file  . Minutes of Exercise per Session: Not on file  Stress:   . Feeling of Stress : Not on file  Social Connections:   . Frequency of Communication with Friends and Family: Not on file  . Frequency of Social Gatherings with Friends and Family: Not on file  . Attends Religious Services: Not on file  . Active Member of Clubs or Organizations: Not on file  . Attends Banker Meetings: Not on file  . Marital Status: Not on file  Intimate Partner Violence:   . Fear of Current or Ex-Partner: Not on file  . Emotionally Abused: Not on file  . Physically Abused: Not on file  . Sexually Abused: Not on file     No Known Allergies   Outpatient Medications Prior to Visit  Medication Sig Dispense Refill  . ibuprofen (ADVIL) 600 MG tablet Take 1 tablet (600 mg total) by mouth every 8 (eight) hours as needed. X2 days prior to menses and 2 days into cycle 90 tablet 1  . ipratropium-albuterol (DUONEB) 0.5-2.5 (3) MG/3ML SOLN Take 3 mLs by nebulization every 4 (four) hours as needed. For sob. 360 mL 2  . Norgestimate-Ethinyl  Estradiol Triphasic (ORTHO TRI-CYCLEN LO) 0.18/0.215/0.25 MG-25 MCG tab Take 1 tablet by mouth daily. 1 Package 11  . Spacer/Aero-Holding Chambers (AEROCHAMBER MV) inhaler Use as instructed 1 each 0  . Vitamin D, Ergocalciferol, (DRISDOL) 1.25 MG (50000 UT) CAPS capsule Take 1 capsule (50,000 Units total) by mouth every 7 (seven) days. 16 capsule 0  . albuterol (VENTOLIN HFA) 108 (90 Base) MCG/ACT inhaler INHALE 2 PUFFS INTO THE LUNGS EVERY 4-6 HOURS AS NEEDED FOR WHEEZING OR SHORTNESS OF BREATH. 18 g 2  . fluticasone (FLONASE) 50 MCG/ACT nasal spray Place 2 sprays into both nostrils daily. 16 g  6  . montelukast (SINGULAIR) 10 MG tablet Take 1 tablet (10 mg total) by mouth at bedtime. 30 tablet 6  . ondansetron (ZOFRAN) 8 MG tablet Take 1 tablet (8 mg total) by mouth every 8 (eight) hours as needed for nausea or vomiting. 30 tablet 0  . pantoprazole (PROTONIX) 40 MG tablet Take 1 tablet (40 mg total) by mouth daily. 30 tablet 3  . SYMBICORT 160-4.5 MCG/ACT inhaler INHALE 2 PUFFS INTO THE LUNGS 2 (TWO) TIMES DAILY. 10.2 g 6  . cetirizine (ZYRTEC) 10 MG tablet Take 1 tablet (10 mg total) by mouth daily. (Patient not taking: Reported on 06/04/2019) 30 tablet 6  . Vitamin D, Ergocalciferol, (DRISDOL) 1.25 MG (50000 UNIT) CAPS capsule SMARTSIG:1 Capsule(s) By Mouth Once a Week     No facility-administered medications prior to visit.     Review of Systems  HENT: Positive for rhinorrhea. Negative for ear pain, hoarse voice, postnasal drip, sneezing, sore throat and trouble swallowing.   Respiratory: Positive for cough, sputum production, shortness of breath and wheezing. Negative for hemoptysis.   Cardiovascular: Negative for dyspnea on exertion.  Gastrointestinal: Positive for heartburn.  Musculoskeletal: Negative for myalgias.  Neurological: Negative for headaches.       Objective:   Physical Exam  Vitals:   06/04/19 1517  BP: 127/79  Pulse: 96  Resp: 18  Temp: 98.5 F (36.9 C)  TempSrc:  Oral  SpO2: 97%  Weight: 182 lb (82.6 kg)  Height: 5\' 5"  (1.651 m)    Gen: Pleasant, well-nourished, in no distress,  normal affect  ENT: No lesions,  mouth clear,  oropharynx clear, no postnasal drip  Neck: No JVD, no TMG, no carotid bruits  Lungs: No use of accessory muscles, no dullness to percussion, right upper lobe few mild expiratory wheezes otherwise clear  Cardiovascular: RRR, heart sounds normal, no murmur or gallops, no peripheral edema  Abdomen: soft and NT, no HSM,  BS normal  Musculoskeletal: No deformities, no cyanosis or clubbing  Neuro: alert, non focal  Skin: Warm, no lesions or rashes      Assessment & Plan:  I personally reviewed all images and lab data in the [facility] system as well as any outside material available during this office visit and agree with the  radiology impressions.   Moderate persistent asthma Moderate persistent asthma and unfortunately this patient has insurance which will not cover an HFA device without greater than $100 co-pay however for a smaller co-pay she can obtain Advair dry powdered inhaler and a ProAir inhaler for as needed albuterol  Plan will be to discontinue Symbicort and begin Advair 250 dry powdered inhaler at 1 puff twice daily I showed the patient how to operate this device  She will also receive a ProAir HFA inhaler as well  She is to continue Flonase daily  I went over in great detail the use of proper reflux diet that she could lose weight with and she is reviewing this to help control her asthma    Gastroesophageal reflux disease without esophagitis A proper reflux diet was reviewed with the patient and she will begin Protonix daily   Loanne was seen today for asthma.  Diagnoses and all orders for this visit:  Moderate persistent asthma with acute exacerbation  Moderate persistent asthma without complication -     montelukast (SINGULAIR) 10 MG tablet; Take 1 tablet (10 mg total) by mouth at  bedtime.  Gastroesophageal reflux disease without esophagitis  Obesity (BMI 30.0-34.9)  Other orders -  fluticasone (FLONASE) 50 MCG/ACT nasal spray; Place 2 sprays into both nostrils daily. -     pantoprazole (PROTONIX) 40 MG tablet; Take 1 tablet (40 mg total) by mouth daily. -     predniSONE (DELTASONE) 10 MG tablet; Take 4 tablets daily for 5 days then stop -     Discontinue: Albuterol Sulfate (PROAIR RESPICLICK) 108 (90 Base) MCG/ACT AEPB; Inhale 2 puffs into the lungs every 6 (six) hours as needed. -     Fluticasone-Salmeterol (ADVAIR DISKUS) 250-50 MCG/DOSE AEPB; Inhale 1 puff into the lungs in the morning and at bedtime. -     albuterol (VENTOLIN HFA) 108 (90 Base) MCG/ACT inhaler; Inhale 2 puffs into the lungs every 4 (four) hours as needed for wheezing or shortness of breath.

## 2019-06-04 NOTE — Assessment & Plan Note (Signed)
A proper reflux diet was reviewed with the patient and she will begin Protonix daily

## 2019-06-04 NOTE — Assessment & Plan Note (Signed)
Moderate persistent asthma and unfortunately this patient has insurance which will not cover an HFA device without greater than $100 co-pay however for a smaller co-pay she can obtain Advair dry powdered inhaler and a ProAir inhaler for as needed albuterol  Plan will be to discontinue Symbicort and begin Advair 250 dry powdered inhaler at 1 puff twice daily I showed the patient how to operate this device  She will also receive a ProAir HFA inhaler as well  She is to continue Flonase daily  I went over in great detail the use of proper reflux diet that she could lose weight with and she is reviewing this to help control her asthma

## 2019-06-14 MED FILL — TRI-LO-SPRINTEC TABLET: 0.18/0.215/ | 28 days supply | Qty: 28 | Fill #2

## 2019-06-27 MED FILL — ALBUTEROL SULFATE HFA 108 (: 108 (90 BAS | 16 days supply | Qty: 9 | Fill #1

## 2019-07-02 MED FILL — IPRAT-ALBUT 0.5-3(2.5) MG/3: 0.5-2.5 (3) | 30 days supply | Qty: 180 | Fill #0

## 2019-07-08 MED FILL — FLUTICASONE-SALMETEROL 250-: 250-50 | 30 days supply | Qty: 60 | Fill #1

## 2019-07-15 MED FILL — ALBUTEROL SULFATE HFA 108 (: 108 (90 BAS | 16 days supply | Qty: 9 | Fill #2

## 2019-07-15 MED FILL — TRI-LO-SPRINTEC TABLET: 0.18/0.215/ | 28 days supply | Qty: 28 | Fill #3

## 2019-07-16 ENCOUNTER — Ambulatory Visit: Payer: BC Managed Care – PPO | Admitting: Critical Care Medicine

## 2019-08-19 ENCOUNTER — Other Ambulatory Visit: Payer: Self-pay | Admitting: Critical Care Medicine

## 2019-08-19 ENCOUNTER — Other Ambulatory Visit: Payer: Self-pay

## 2019-08-19 MED ORDER — FLUTICASONE-SALMETEROL 250-50 MCG/DOSE IN AEPB: 1.0000 | INHALATION_SPRAY | Freq: Two times a day (BID) | RESPIRATORY_TRACT | 2 refills | Status: DC

## 2019-08-19 MED ORDER — PREDNISONE 10 MG PO TABS: ORAL_TABLET | ORAL | 0 refills | Status: DC

## 2019-08-19 MED ORDER — ALBUTEROL SULFATE HFA 108 (90 BASE) MCG/ACT IN AERS: 2.0000 | INHALATION_SPRAY | RESPIRATORY_TRACT | 3 refills | Status: DC | PRN

## 2019-08-19 MED FILL — predniSONE 10 MG TABS: 10 | 5 days supply | Qty: 20 | Fill #0

## 2019-08-20 MED FILL — ALBUTEROL SULFATE HFA 108 (: 108 (90 BAS | 16 days supply | Qty: 18 | Fill #0

## 2019-09-27 MED FILL — ALBUTEROL SULFATE HFA 108 (: 108 (90 BAS | 16 days supply | Qty: 18 | Fill #2

## 2019-09-27 MED FILL — IPRAT-ALBUT 0.5-3(2.5) MG/3: 0.5-2.5 (3) | 30 days supply | Qty: 180 | Fill #1

## 2019-10-04 MED FILL — FLUTICASONE-SALMETEROL 250-: 250-50 | 30 days supply | Qty: 60 | Fill #1

## 2019-10-04 MED FILL — TRI-LO-SPRINTEC TABLET: 0.18/0.215/ | 28 days supply | Qty: 28 | Fill #6

## 2019-10-22 MED FILL — MONTELUKAST SOD 10 MG TAB: 10 | 30 days supply | Qty: 30 | Fill #1

## 2019-10-22 MED FILL — ALBUTEROL SULFATE HFA 108 (: 108 (90 BAS | 16 days supply | Qty: 18 | Fill #3

## 2019-11-04 ENCOUNTER — Other Ambulatory Visit: Payer: Self-pay | Admitting: Critical Care Medicine

## 2019-11-04 MED FILL — TRI-LO-SPRINTEC TABLET: 0.18/0.215/ | 28 days supply | Qty: 28 | Fill #7

## 2019-11-04 MED FILL — FLUTICASONE-SALMETEROL 250-: 250-50 | 30 days supply | Qty: 60 | Fill #2

## 2019-11-05 MED FILL — ALBUTEROL SULFATE HFA 108 (: 108 (90 BAS | 25 days supply | Qty: 18 | Fill #0

## 2019-11-19 ENCOUNTER — Telehealth: Payer: Self-pay | Admitting: Internal Medicine

## 2019-11-19 NOTE — Telephone Encounter (Signed)
Error:[id number] ° °

## 2019-11-20 MED FILL — MONTELUKAST SOD 10 MG TAB: 10 | 30 days supply | Qty: 30 | Fill #2

## 2019-11-20 MED FILL — ALBUTEROL SULFATE HFA 108 (: 108 (90 BAS | 16 days supply | Qty: 18 | Fill #1

## 2019-11-20 MED FILL — IPRAT-ALBUT 0.5-3(2.5) MG/3: 0.5-2.5 (3) | 30 days supply | Qty: 180 | Fill #2

## 2019-12-03 ENCOUNTER — Other Ambulatory Visit: Payer: Self-pay | Admitting: Critical Care Medicine

## 2019-12-03 MED FILL — ALBUTEROL SULFATE HFA 108 (: 108 (90 BAS | 16 days supply | Qty: 18 | Fill #2

## 2019-12-03 MED FILL — FLUTICASONE-SALMETEROL 250-: 250-50 | 30 days supply | Qty: 60 | Fill #0

## 2019-12-03 MED FILL — TRI-LO-SPRINTEC TABLET: 0.18/0.215/ | 28 days supply | Qty: 28 | Fill #8

## 2019-12-26 MED FILL — TRI-LO-SPRINTEC TABLET: 0.18/0.215/ | 28 days supply | Qty: 28 | Fill #9

## 2019-12-26 MED FILL — ALBUTEROL SULFATE HFA 108 (: 108 (90 BAS | 16 days supply | Qty: 18 | Fill #3

## 2020-01-03 MED FILL — IPRAT-ALBUT 0.5-3(2.5) MG/3: 0.5-2.5 (3) | 30 days supply | Qty: 180 | Fill #3

## 2020-01-03 MED FILL — FLUTICASONE-SALMETEROL 250-: 250-50 | 30 days supply | Qty: 60 | Fill #1

## 2020-01-22 ENCOUNTER — Other Ambulatory Visit: Payer: Self-pay | Admitting: Critical Care Medicine

## 2020-01-22 NOTE — Telephone Encounter (Signed)
Requested medication (s) are due for refill today: No - should still have a refill.  Requested medication (s) are on the active medication list: Yes  Last refill:  11/04/19  Future visit scheduled: No  Notes to clinic:  See request.    Requested Prescriptions  Pending Prescriptions Disp Refills   albuterol (VENTOLIN HFA) 108 (90 Base) MCG/ACT inhaler [Pharmacy Med Name: ALBUTEROL SULFATE HFA 108 ( 108 (90 BAS Aerosol] 18 g 3    Sig: INHALE 2 PUFFS INTO THE LUNGS EVERY 4 (FOUR) HOURS AS NEEDED FOR WHEEZING OR SHORTNESS OF BREATH.      Pulmonology:  Beta Agonists Failed - 01/22/2020  9:50 AM      Failed - One inhaler should last at least one month. If the patient is requesting refills earlier, contact the patient to check for uncontrolled symptoms.      Passed - Valid encounter within last 12 months    Recent Outpatient Visits           7 months ago Moderate persistent asthma with acute exacerbation   [facility] [name], [place]   10 months ago Nausea   [facility] [place], Marylene Land M, [name]   12 months ago Moderate persistent asthma without complication   [facility] [name], [facility]   1 year ago Moderate persistent asthma without complication   [facility] [name], [place]   1 year ago Moderate persistent asthma without complication   [facility] [name], [place]

## 2020-01-23 ENCOUNTER — Other Ambulatory Visit: Payer: Self-pay | Admitting: Pharmacist

## 2020-01-23 ENCOUNTER — Other Ambulatory Visit: Payer: Self-pay | Admitting: Internal Medicine

## 2020-01-23 MED ORDER — ALBUTEROL SULFATE HFA 108 (90 BASE) MCG/ACT IN AERS: 2.0000 | INHALATION_SPRAY | RESPIRATORY_TRACT | 0 refills | Status: DC | PRN

## 2020-01-23 MED FILL — ALBUTEROL SULFATE HFA 108 (: 108 (90 BAS | 16 days supply | Qty: 18 | Fill #0

## 2020-02-05 ENCOUNTER — Other Ambulatory Visit: Payer: Self-pay | Admitting: Internal Medicine

## 2020-02-05 MED ORDER — ALBUTEROL SULFATE HFA 108 (90 BASE) MCG/ACT IN AERS: 2.0000 | INHALATION_SPRAY | RESPIRATORY_TRACT | 0 refills | Status: DC | PRN

## 2020-02-05 MED FILL — ALBUTEROL SULFATE HFA 108 (: 108 (90 BAS | 16 days supply | Qty: 18 | Fill #0

## 2020-03-12 ENCOUNTER — Other Ambulatory Visit: Payer: Self-pay | Admitting: Critical Care Medicine

## 2020-03-12 ENCOUNTER — Other Ambulatory Visit: Payer: Self-pay | Admitting: Internal Medicine

## 2020-03-12 MED ORDER — ALBUTEROL SULFATE HFA 108 (90 BASE) MCG/ACT IN AERS: 2.0000 | INHALATION_SPRAY | RESPIRATORY_TRACT | 0 refills | Status: DC | PRN

## 2020-03-13 MED FILL — ALBUTEROL SULFATE HFA 108 (: 108 (90 BAS | 16 days supply | Qty: 18 | Fill #0

## 2020-04-16 ENCOUNTER — Other Ambulatory Visit: Payer: Self-pay

## 2020-04-16 ENCOUNTER — Other Ambulatory Visit: Payer: Self-pay | Admitting: Internal Medicine

## 2020-04-16 MED ORDER — ALBUTEROL SULFATE HFA 108 (90 BASE) MCG/ACT IN AERS: 2.0000 | INHALATION_SPRAY | RESPIRATORY_TRACT | 0 refills | Status: DC | PRN

## 2020-04-16 MED FILL — ALBUTEROL SULFATE HFA 108 (: 108 (90 BAS | 16 days supply | Qty: 18 | Fill #0

## 2020-05-27 ENCOUNTER — Other Ambulatory Visit: Payer: Self-pay | Admitting: Physician Assistant

## 2020-05-27 ENCOUNTER — Telehealth: Payer: Self-pay | Admitting: Physician Assistant

## 2020-05-27 ENCOUNTER — Telehealth: Payer: Self-pay

## 2020-05-27 DIAGNOSIS — J4540 Moderate persistent asthma, uncomplicated: Secondary | ICD-10-CM

## 2020-05-27 DIAGNOSIS — E559 Vitamin D deficiency, unspecified: Secondary | ICD-10-CM

## 2020-05-27 MED ORDER — MONTELUKAST SODIUM 10 MG PO TABS: 10.0000 mg | ORAL_TABLET | Freq: Every day | ORAL | 6 refills | Status: DC

## 2020-05-27 MED ORDER — ALBUTEROL SULFATE HFA 108 (90 BASE) MCG/ACT IN AERS
2.0000 | INHALATION_SPRAY | RESPIRATORY_TRACT | 2 refills | Status: DC | PRN
  Filled 2020-08-20: qty 18, 25d supply, fill #0

## 2020-05-27 MED FILL — MONTELUKAST SOD 10 MG TAB: 10 | 30 days supply | Qty: 30 | Fill #0

## 2020-05-27 MED FILL — ALBUTEROL SULFATE HFA 108 (: 108 (90 BAS | 16 days supply | Qty: 18 | Fill #0

## 2020-05-27 NOTE — Telephone Encounter (Signed)
Reached out by phone to schedule a virtual visit with [facility]. Patient is aware of the time of the visit and recent phone number has been confirmed.

## 2020-05-27 NOTE — Progress Notes (Signed)
Patient verified DOB Patient denies pain at this time.  

## 2020-05-27 NOTE — Progress Notes (Signed)
Established Patient Office Visit  Subjective:  Patient ID: Dawn Robertson, female    DOB: 02-13-1986  Age: 35 y.o. MRN: [id number]  CC:  Chief Complaint  Patient presents with  . Medication Refill   Virtual Visit via Telephone Note  I connected with Dawn Robertson on 05/27/20 at  3:00 PM EST by telephone and verified that I am speaking with the correct person using two identifiers.  Location: Patient: Home Provider: [facility]    I discussed the limitations, risks, security and privacy concerns of performing an evaluation and management service by telephone and the availability of in person appointments. I also discussed with the patient that there may be a patient responsible charge related to this service. The patient expressed understanding and agreed to proceed.   History of Present Illness:   Dawn Robertson reports that she needs a refill of her albuterol inhaler.  Reports that she has been using her Advair on a daily basis as directed, states that she will need to use her albuterol inhaler when there is a weather change.  Reports that she recently restarted her Singulair again due to the weather change, states that these do offer relief.  Reports that she has not been using the Flonase and does feel that she is having some sinus pressure and states that this has previously offered her relief.  No other concerns at this time   Observations/Objective:  Medical history and current medications reviewed, no physical exam completed     Past Medical History:  Diagnosis Date  . Asthma    seasonal  . Seasonal allergies   . Shortness of breath   . Tobacco abuse     History reviewed. No pertinent surgical history.  Family History  Problem Relation Age of Onset  . Asthma Mother   . Breast cancer Mother 48       s/p surgery and radiation  . Other Mother        hx of hysterectomy for bleeding/blood clotting in her 58s  . Asthma  Father   . Diabetes type II Paternal Aunt   . Prostate cancer Other        maternal great uncle (MGM's brother) d. later age    Social History   Socioeconomic History  . Marital status: Single    Spouse name: Not on file  . Number of children: Not on file  . Years of education: Not on file  . Highest education level: Not on file  Occupational History  . Not on file  Tobacco Use  . Smoking status: Former Smoker    Types: Cigarettes    Quit date: 03/28/2013    Years since quitting: 7.1  . Smokeless tobacco: Never Used  . Tobacco comment: hx of 1 pk every 3 days  Substance and Sexual Activity  . Alcohol use: Yes    Alcohol/week: 0.0 standard drinks    Comment: daily glass of wine  . Drug use: No  . Sexual activity: Not Currently    Birth control/protection: None  Other Topics Concern  . Not on file  Social History Narrative  . Not on file   Social Determinants of Health   Financial Resource Strain: Not on file  Food Insecurity: Not on file  Transportation Needs: Not on file  Physical Activity: Not on file  Stress: Not on file  Social Connections: Not on file  Intimate Partner Violence: Not on file    Outpatient Medications Prior  to Visit  Medication Sig Dispense Refill  . fluticasone (FLONASE) 50 MCG/ACT nasal spray Place 2 sprays into both nostrils daily. 16 g 6  . Fluticasone-Salmeterol (ADVAIR) 250-50 MCG/DOSE AEPB INHALE 1 PUFF INTO THE LUNGS IN THE MORNING AND AT BEDTIME. 60 each 2  . ibuprofen (ADVIL) 600 MG tablet Take 1 tablet (600 mg total) by mouth every 8 (eight) hours as needed. X2 days prior to menses and 2 days into cycle 90 tablet 1  . ipratropium-albuterol (DUONEB) 0.5-2.5 (3) MG/3ML SOLN Take 3 mLs by nebulization every 4 (four) hours as needed. For sob. 360 mL 2  . Norgestimate-Ethinyl Estradiol Triphasic (ORTHO TRI-CYCLEN LO) 0.18/0.215/0.25 MG-25 MCG tab Take 1 tablet by mouth daily. 1 Package 11  . pantoprazole (PROTONIX) 40 MG tablet Take 1 tablet  (40 mg total) by mouth daily. 30 tablet 3  . predniSONE (DELTASONE) 10 MG tablet Take 4 tablets daily for 5 days then stop 20 tablet 0  . Spacer/Aero-Holding Chambers (AEROCHAMBER MV) inhaler Use as instructed 1 each 0  . Vitamin D, Ergocalciferol, (DRISDOL) 1.25 MG (50000 UT) CAPS capsule Take 1 capsule (50,000 Units total) by mouth every 7 (seven) days. 16 capsule 0  . cetirizine (ZYRTEC) 10 MG tablet Take 1 tablet (10 mg total) by mouth daily. (Patient not taking: Reported on 06/04/2019) 30 tablet 6  . albuterol (VENTOLIN HFA) 108 (90 Base) MCG/ACT inhaler Inhale 2 puffs into the lungs every 4 (four) hours as needed for wheezing or shortness of breath. 18 g 0  . montelukast (SINGULAIR) 10 MG tablet Take 1 tablet (10 mg total) by mouth at bedtime. 30 tablet 6   No facility-administered medications prior to visit.    No Known Allergies  ROS Review of Systems  Constitutional: Negative for chills and fever.  HENT: Positive for congestion and postnasal drip. Negative for sinus pressure and sore throat.   Eyes: Negative.   Respiratory: Negative for cough, shortness of breath and wheezing.   Cardiovascular: Negative.   Gastrointestinal: Negative.   Endocrine: Negative.   Genitourinary: Negative.   Musculoskeletal: Negative.   Skin: Negative.   Allergic/Immunologic: Positive for environmental allergies.  Neurological: Negative.   Hematological: Negative.   Psychiatric/Behavioral: Negative.       Objective:     LMP 05/10/2020  Wt Readings from Last 3 Encounters:  06/04/19 182 lb (82.6 kg)  04/18/19 175 lb 6.4 oz (79.6 kg)  03/06/19 172 lb (78 kg)     Health Maintenance Due  Topic Date Due  . Hepatitis C Screening  Never done    There are no preventive care reminders to display for this patient.  Lab Results  Component Value Date   TSH 1.120 03/06/2019   Lab Results  Component Value Date   WBC 8.0 03/06/2019   HGB 15.2 03/06/2019   HCT 45.3 03/06/2019   MCV 100 (H)  03/06/2019   PLT 242 03/06/2019   Lab Results  Component Value Date   NA 136 03/06/2019   K 4.0 03/06/2019   CO2 22 03/06/2019   GLUCOSE 90 03/06/2019   BUN 9 03/06/2019   CREATININE 0.71 03/06/2019   BILITOT 0.5 03/06/2019   ALKPHOS 60 03/06/2019   AST 11 03/06/2019   ALT 9 03/06/2019   PROT 7.9 03/06/2019   ALBUMIN 4.3 03/06/2019   CALCIUM 9.9 03/06/2019   ANIONGAP 9 04/25/2017   No results found for: CHOL No results found for: HDL No results found for: LDLCALC No results found for: TRIG No  results found for: CHOLHDL Lab Results  Component Value Date   HGBA1C 4.7 08/17/2016      Assessment & Plan:   Problem List Items Addressed This Visit      Respiratory   Moderate persistent asthma (Chronic)   Relevant Medications   albuterol (VENTOLIN HFA) 108 (90 Base) MCG/ACT inhaler   montelukast (SINGULAIR) 10 MG tablet    Other Visit Diagnoses    Vitamin D deficiency    -  Primary   Relevant Orders   Vitamin D, 25-hydroxy     Assessment and Plan:  1. Moderate persistent asthma without complication Patient education given on allergy avoidance, refill albuterol and Singulair.  States that she purchases Flonase over-the-counter.  Red flags given for prompt reevaluation - albuterol (VENTOLIN HFA) 108 (90 Base) MCG/ACT inhaler; Inhale 2 puffs into the lungs every 4 (four) hours as needed for wheezing or shortness of breath.  Dispense: 18 g; Refill: 2 - montelukast (SINGULAIR) 10 MG tablet; Take 1 tablet (10 mg total) by mouth at bedtime.  Dispense: 30 tablet; Refill: 6  2. Vitamin D deficiency On review of patient's chart, she has previously been treated for vitamin D deficiency, patient did not recall if she ever did the weekly treatment for vitamin D replacement. - Vitamin D, 25-hydroxy; Future  .  Follow Up Instructions:    I discussed the assessment and treatment plan with the patient. The patient was provided an opportunity to ask questions and all were  answered. The patient agreed with the plan and demonstrated an understanding of the instructions.   The patient was advised to call back or seek an in-person evaluation if the symptoms worsen or if the condition fails to improve as anticipated.  I provided 14 minutes of non-face-to-face time during this encounter.     Meds ordered this encounter  Medications  . albuterol (VENTOLIN HFA) 108 (90 Base) MCG/ACT inhaler    Sig: Inhale 2 puffs into the lungs every 4 (four) hours as needed for wheezing or shortness of breath.    Dispense:  18 g    Refill:  2    Order Specific Question:   Supervising Provider    Answer:   WRIGHT, PATRICK E [[id number]]  . montelukast (SINGULAIR) 10 MG tablet    Sig: Take 1 tablet (10 mg total) by mouth at bedtime.    Dispense:  30 tablet    Refill:  6    Order Specific Question:   Supervising Provider    Answer:   [name] [[id number]]    Follow-up: Return if symptoms worsen or fail to improve.    Dawn Knudsen Mayers, PA-C

## 2020-05-28 NOTE — Patient Instructions (Addendum)
I sent your refills to the pharmacy for you.    I put in the lab order for your vitamin D to be checked.  Please let us know if you are going to have this completed at community [facility] or you want to come to the mobile medicine unit for that.  We will make sure that they are aware that you are coming.  We will call you with that lab results.  Please let us know if there is anything else we can do for you  Roney Jaffe, PA-C Physician Assistant [facility] [email or web address]    Vitamin D Deficiency Vitamin D deficiency is when your body does not have enough vitamin D. Vitamin D is important to your body for many reasons:  It helps the body absorb two important minerals--calcium and phosphorus.  It plays a role in bone health.  It may help to prevent some diseases, such as diabetes and multiple sclerosis.  It plays a role in muscle function, including heart function. If vitamin D deficiency is severe, it can cause a condition in which your bones become soft. In adults, this condition is called osteomalacia. In children, this condition is called rickets. What are the causes? This condition may be caused by:  Not eating enough foods that contain vitamin D.  Not getting enough natural sun exposure.  Having certain digestive system diseases that make it difficult for your body to absorb vitamin D. These diseases include Crohn's disease, chronic pancreatitis, and cystic fibrosis.  Having a surgery in which a part of the stomach or a part of the small intestine is removed.  Having chronic kidney disease or liver disease. What increases the risk? You are more likely to develop this condition if you:  Are older.  Do not spend much time outdoors.  Live in a long-term care facility.  Have had broken bones.  Have weak or thin bones (osteoporosis).  Have a disease or condition that changes how the body  absorbs vitamin D.  Have dark skin.  Take certain medicines, such as steroid medicines or certain seizure medicines.  Are overweight or obese. What are the signs or symptoms? In mild cases of vitamin D deficiency, there may not be any symptoms. If the condition is severe, symptoms may include:  Bone pain.  Muscle pain.  Falling often.  Broken bones caused by a minor injury. How is this diagnosed? This condition may be diagnosed with blood tests. Imaging tests such as X-rays may also be done to look for changes in the bone. How is this treated? Treatment for this condition may depend on what caused the condition. Treatment options include:  Taking vitamin D supplements. Your health care provider will suggest what dose is best for you.  Taking a calcium supplement. Your health care provider will suggest what dose is best for you. Follow these instructions at home: Eating and drinking  Eat foods that contain vitamin D. Choices include: ? Fortified dairy products, cereals, or juices. Fortified means that vitamin D has been added to the food. Check the label on the package to see if the food is fortified. ? Fatty fish, such as salmon or trout. ? Eggs. ? Oysters. ? Mushrooms. The items listed above may not be a complete list of recommended foods and beverages. Contact a dietitian for more information.   General instructions  Take medicines and supplements only as told by your health care provider.  Get regular, safe exposure to natural  sunlight.  Do not use a tanning bed.  Maintain a healthy weight. Lose weight if needed.  Keep all follow-up visits as told by your health care provider. This is important. How is this prevented? You can get vitamin D by:  Eating foods that naturally contain vitamin D.  Eating or drinking products that have been fortified with vitamin D, such as cereals, juices, and dairy products (including milk).  Taking a vitamin D supplement or a  multivitamin supplement that contains vitamin D.  Being in the sun. Your body naturally makes vitamin D when your skin is exposed to sunlight. Your body changes the sunlight into a form of the vitamin that it can use. Contact a health care provider if:  Your symptoms do not go away.  You feel nauseous or you vomit.  You have fewer bowel movements than usual or are constipated. Summary  Vitamin D deficiency is when your body does not have enough vitamin D.  Vitamin D is important to your body for good bone health and muscle function, and it may help prevent some diseases.  Vitamin D deficiency is primarily treated through supplementation. Your health care provider will suggest what dose is best for you.  You can get vitamin D by eating foods that contain vitamin D, by being in the sun, and by taking a vitamin D supplement or a multivitamin supplement that contains vitamin D. This information is not intended to replace advice given to you by your health care provider. Make sure you discuss any questions you have with your health care provider. Document Revised: 11/20/2017 Document Reviewed: 11/20/2017 Elsevier Patient Education  2021 [employer].

## 2020-06-19 ENCOUNTER — Other Ambulatory Visit: Payer: Self-pay

## 2020-06-19 MED FILL — !VENTOLIN HFA INHALER: 108 (90 BAS | 16 days supply | Qty: 18 | Fill #1

## 2020-06-19 NOTE — Telephone Encounter (Signed)
Will forward to provider  

## 2020-06-20 ENCOUNTER — Other Ambulatory Visit: Payer: Self-pay | Admitting: Internal Medicine

## 2020-06-20 MED ORDER — PREDNISONE 10 MG PO TABS: ORAL_TABLET | ORAL | 0 refills | Status: DC

## 2020-06-22 MED FILL — predniSONE 10 MG TABS: 10 | 5 days supply | Qty: 20 | Fill #0

## 2020-07-13 ENCOUNTER — Other Ambulatory Visit: Payer: Self-pay

## 2020-07-13 MED FILL — Albuterol Sulfate Inhal Aero 108 MCG/ACT (90MCG Base Equiv): RESPIRATORY_TRACT | 16 days supply | Qty: 18 | Fill #0 | Status: AC

## 2020-07-13 MED FILL — Montelukast Sodium Tab 10 MG (Base Equiv): ORAL | 30 days supply | Qty: 30 | Fill #0 | Status: AC

## 2020-07-14 ENCOUNTER — Other Ambulatory Visit: Payer: Self-pay

## 2020-07-30 ENCOUNTER — Other Ambulatory Visit: Payer: Self-pay

## 2020-08-20 ENCOUNTER — Other Ambulatory Visit: Payer: Self-pay

## 2020-08-21 ENCOUNTER — Other Ambulatory Visit: Payer: Self-pay

## 2020-08-21 MED FILL — Montelukast Sodium Tab 10 MG (Base Equiv): ORAL | 30 days supply | Qty: 30 | Fill #1 | Status: AC

## 2020-08-25 ENCOUNTER — Other Ambulatory Visit: Payer: Self-pay

## 2020-09-16 ENCOUNTER — Other Ambulatory Visit: Payer: Self-pay

## 2020-10-08 ENCOUNTER — Other Ambulatory Visit: Payer: Self-pay

## 2020-10-08 ENCOUNTER — Other Ambulatory Visit: Payer: Self-pay | Admitting: Pharmacist

## 2020-10-08 ENCOUNTER — Other Ambulatory Visit: Payer: Self-pay | Admitting: Physician Assistant

## 2020-10-08 ENCOUNTER — Encounter: Payer: Self-pay | Admitting: Physician Assistant

## 2020-10-08 ENCOUNTER — Telehealth (INDEPENDENT_AMBULATORY_CARE_PROVIDER_SITE_OTHER): Payer: Self-pay | Admitting: Physician Assistant

## 2020-10-08 ENCOUNTER — Ambulatory Visit: Payer: Self-pay | Admitting: Physician Assistant

## 2020-10-08 DIAGNOSIS — J4540 Moderate persistent asthma, uncomplicated: Secondary | ICD-10-CM

## 2020-10-08 MED ORDER — ALBUTEROL SULFATE HFA 108 (90 BASE) MCG/ACT IN AERS
2.0000 | INHALATION_SPRAY | Freq: Four times a day (QID) | RESPIRATORY_TRACT | 2 refills | Status: DC | PRN
  Filled 2020-10-08: qty 18, fill #0

## 2020-10-08 MED ORDER — MONTELUKAST SODIUM 10 MG PO TABS
10.0000 mg | ORAL_TABLET | Freq: Every day | ORAL | 6 refills | Status: DC
  Filled 2020-10-08: qty 30, 30d supply, fill #0
  Filled 2020-11-09: qty 30, 30d supply, fill #1
  Filled 2020-12-10: qty 30, 30d supply, fill #2
  Filled 2021-01-14: qty 30, 30d supply, fill #3
  Filled 2021-02-16: qty 30, 30d supply, fill #4
  Filled 2021-03-17: qty 30, 30d supply, fill #5
  Filled 2021-06-01: qty 30, 30d supply, fill #0

## 2020-10-08 MED ORDER — ALBUTEROL SULFATE HFA 108 (90 BASE) MCG/ACT IN AERS
2.0000 | INHALATION_SPRAY | RESPIRATORY_TRACT | 3 refills | Status: DC | PRN
  Filled 2020-10-08: qty 18, 25d supply, fill #0

## 2020-10-08 MED ORDER — ALBUTEROL SULFATE HFA 108 (90 BASE) MCG/ACT IN AERS
2.0000 | INHALATION_SPRAY | Freq: Four times a day (QID) | RESPIRATORY_TRACT | 2 refills | Status: DC | PRN
  Filled 2020-10-08: qty 8.5, 16d supply, fill #0

## 2020-10-08 MED ORDER — ALBUTEROL SULFATE HFA 108 (90 BASE) MCG/ACT IN AERS
2.0000 | INHALATION_SPRAY | RESPIRATORY_TRACT | 2 refills | Status: DC | PRN
  Filled 2020-10-08: qty 8.5, 16d supply, fill #0
  Filled 2020-10-09: qty 8.5, 25d supply, fill #0
  Filled 2020-10-09: qty 18, 25d supply, fill #0
  Filled 2020-11-09: qty 8.5, 25d supply, fill #1
  Filled 2020-12-10: qty 8.5, 25d supply, fill #2

## 2020-10-08 NOTE — Progress Notes (Signed)
Patient ID: Dawn Robertson, female   DOB: September 12, 1985, 35 y.o.   MRN: [id number] Virtual Visit via Telephone Note  I connected with Dawn Robertson on 10/08/20 at  3:50 PM EDT by telephone and verified that I am speaking with the correct person using two identifiers.  Location: Patient: home Provider: [facility] office   I discussed the limitations, risks, security and privacy concerns of performing an evaluation and management service by telephone and the availability of in person appointments. I also discussed with the patient that there may be a patient responsible charge related to this service. The patient expressed understanding and agreed to proceed.   History of Present Illness: Patient has a physical scheduled for next month.  Today  she just needs a RF on albuterol and singulair.  No other issues   Observations/Objective:  NAD.  A&Ox3   Assessment and Plan: 1. Moderate persistent asthma without complication - albuterol (VENTOLIN HFA) 108 (90 Base) MCG/ACT inhaler; INHALE 2 PUFFS INTO THE LUNGS EVERY 4 (FOUR) HOURS AS NEEDED FOR WHEEZING OR SHORTNESS OF BREATH.  Dispense: 18 g; Refill: 3 - montelukast (SINGULAIR) 10 MG tablet; Take 1 tablet (10 mg total) by mouth at bedtime.  Dispense: 30 tablet; Refill: 6    Follow Up Instructions: See PCP next month as planned   I discussed the assessment and treatment plan with the patient. The patient was provided an opportunity to ask questions and all were answered. The patient agreed with the plan and demonstrated an understanding of the instructions.   The patient was advised to call back or seek an in-person evaluation if the symptoms worsen or if the condition fails to improve as anticipated.  I provided 7 minutes of non-face-to-face time during this encounter.   Georgian Co, PA-C

## 2020-10-09 ENCOUNTER — Other Ambulatory Visit: Payer: Self-pay

## 2020-11-09 ENCOUNTER — Other Ambulatory Visit: Payer: Self-pay

## 2020-11-09 ENCOUNTER — Encounter: Payer: Self-pay | Admitting: Internal Medicine

## 2020-11-10 ENCOUNTER — Other Ambulatory Visit: Payer: Self-pay

## 2020-12-10 ENCOUNTER — Other Ambulatory Visit: Payer: Self-pay

## 2020-12-11 ENCOUNTER — Other Ambulatory Visit: Payer: Self-pay

## 2020-12-25 ENCOUNTER — Other Ambulatory Visit: Payer: Self-pay | Admitting: Internal Medicine

## 2020-12-25 ENCOUNTER — Other Ambulatory Visit: Payer: Self-pay

## 2020-12-25 MED ORDER — FLUTICASONE-SALMETEROL 250-50 MCG/ACT IN AEPB
1.0000 | INHALATION_SPRAY | Freq: Two times a day (BID) | RESPIRATORY_TRACT | 0 refills | Status: DC
  Filled 2020-12-25: qty 60, 30d supply, fill #0

## 2020-12-25 NOTE — Telephone Encounter (Signed)
Requested medication (s) are due for refill today: Yes  Requested medication (s) are on the active medication list: Yes  Last refill:  12/03/19  Future visit scheduled: No  Notes to clinic:  Prescription has expired.    Requested Prescriptions  Pending Prescriptions Disp Refills   fluticasone-salmeterol (ADVAIR) 250-50 MCG/ACT AEPB 60 each 2    Sig: INHALE 1 PUFF INTO THE LUNGS IN THE MORNING AND AT BEDTIME.     Pulmonology:  Combination Products Failed - 12/25/2020 12:56 PM      Failed - Valid encounter within last 12 months    Recent Outpatient Visits           1 year ago Moderate persistent asthma with acute exacerbation   [facility] [name], MD   1 year ago Nausea   [facility] [place], [place], [name]   1 year ago Moderate persistent asthma without complication   [facility] [name], MD   2 years ago Moderate persistent asthma without complication   [place] [facility] [name], MD   2 years ago Moderate persistent asthma without complication   [facility] [name], MD

## 2020-12-28 ENCOUNTER — Other Ambulatory Visit: Payer: Self-pay

## 2020-12-29 ENCOUNTER — Other Ambulatory Visit: Payer: Self-pay

## 2021-01-14 ENCOUNTER — Other Ambulatory Visit: Payer: Self-pay

## 2021-01-14 ENCOUNTER — Other Ambulatory Visit: Payer: Self-pay | Admitting: Internal Medicine

## 2021-01-14 NOTE — Telephone Encounter (Signed)
Requested medications are due for refill today yes  Requested medications are on the active medication list yes  Last refill 12/11/20  Last visit 06/04/19  Future visit scheduled no  Notes to clinic Failed protocol due to no valid visit within 12  months, no upcoming visit scheduled, please assess.  Requested Prescriptions  Pending Prescriptions Disp Refills   albuterol (VENTOLIN HFA) 108 (90 Base) MCG/ACT inhaler 8.5 g 2    Sig: Inhale 2 puffs into the lungs every 4 (four) hours as needed for wheezing or shortness of breath.     Pulmonology:  Beta Agonists Failed - 01/14/2021  9:27 AM      Failed - One inhaler should last at least one month. If the patient is requesting refills earlier, contact the patient to check for uncontrolled symptoms.      Failed - Valid encounter within last 12 months    Recent Outpatient Visits           1 year ago Moderate persistent asthma with acute exacerbation   [place] [facility] [name], MD   1 year ago Nausea   [facility] [place], [place], [name]   1 year ago Moderate persistent asthma without complication   [facility] [name], MD   2 years ago Moderate persistent asthma without complication   [place] [facility] [name], MD   2 years ago Moderate persistent asthma without complication   [facility] [name], MD

## 2021-01-15 ENCOUNTER — Encounter: Payer: Self-pay | Admitting: Internal Medicine

## 2021-01-15 ENCOUNTER — Other Ambulatory Visit: Payer: Self-pay

## 2021-01-15 MED ORDER — ALBUTEROL SULFATE HFA 108 (90 BASE) MCG/ACT IN AERS
2.0000 | INHALATION_SPRAY | RESPIRATORY_TRACT | 4 refills | Status: DC | PRN
  Filled 2021-01-15: qty 8.5, 25d supply, fill #0
  Filled 2021-02-08: qty 8.5, 25d supply, fill #1
  Filled 2021-03-01: qty 8.5, 25d supply, fill #2
  Filled 2021-03-17: qty 8.5, 25d supply, fill #3

## 2021-02-04 ENCOUNTER — Other Ambulatory Visit: Payer: Self-pay | Admitting: Internal Medicine

## 2021-02-04 ENCOUNTER — Other Ambulatory Visit: Payer: Self-pay

## 2021-02-04 MED ORDER — FLUTICASONE-SALMETEROL 250-50 MCG/ACT IN AEPB
1.0000 | INHALATION_SPRAY | Freq: Two times a day (BID) | RESPIRATORY_TRACT | 0 refills | Status: DC
  Filled 2021-02-04: qty 60, 30d supply, fill #0

## 2021-02-04 NOTE — Telephone Encounter (Signed)
Requested medication (s) are due for refill today: expired medication   Requested medication (s) are on the active medication list: yes  Last refill:  12/25/20-01/28/21 #60 0 refills  Future visit scheduled: yes in 2 months  Notes to clinic:  expired medication. Do you want to renew Rx? Patient reports she only has 6 left.     Requested Prescriptions  Pending Prescriptions Disp Refills   fluticasone-salmeterol (ADVAIR) 250-50 MCG/ACT AEPB 60 each 0    Sig: Inhale 1 puff into the lungs 2 (two) times daily.     Pulmonology:  Combination Products Failed - 02/04/2021  8:15 AM      Failed - Valid encounter within last 12 months    Recent Outpatient Visits           1 year ago Moderate persistent asthma with acute exacerbation   [place] [facility] [name], MD   1 year ago Nausea   [facility] [name], [name], [name]   2 years ago Moderate persistent asthma without complication   [facility] [name], MD   2 years ago Moderate persistent asthma without complication   [place] [facility] [name], MD   2 years ago Moderate persistent asthma without complication   [place] [facility] [name], MD       Future Appointments             In 2 months [name], MD [facility]

## 2021-02-05 ENCOUNTER — Other Ambulatory Visit: Payer: Self-pay

## 2021-02-08 ENCOUNTER — Other Ambulatory Visit: Payer: Self-pay

## 2021-02-16 ENCOUNTER — Other Ambulatory Visit: Payer: Self-pay

## 2021-03-01 ENCOUNTER — Other Ambulatory Visit: Payer: Self-pay

## 2021-03-02 ENCOUNTER — Other Ambulatory Visit: Payer: Self-pay

## 2021-03-03 ENCOUNTER — Encounter: Payer: Self-pay | Admitting: Internal Medicine

## 2021-03-03 NOTE — Telephone Encounter (Signed)
Pt is requesting refill on inhaler

## 2021-03-04 ENCOUNTER — Other Ambulatory Visit: Payer: Self-pay

## 2021-03-04 ENCOUNTER — Other Ambulatory Visit: Payer: Self-pay | Admitting: Internal Medicine

## 2021-03-04 MED ORDER — FLUTICASONE-SALMETEROL 250-50 MCG/ACT IN AEPB
1.0000 | INHALATION_SPRAY | Freq: Two times a day (BID) | RESPIRATORY_TRACT | 0 refills | Status: DC
Start: 2021-03-04 — End: 2021-04-06
  Filled 2021-03-04: qty 60, 30d supply, fill #0

## 2021-03-08 ENCOUNTER — Other Ambulatory Visit: Payer: Self-pay

## 2021-03-18 ENCOUNTER — Other Ambulatory Visit: Payer: Self-pay

## 2021-04-06 ENCOUNTER — Ambulatory Visit: Payer: Self-pay | Attending: Internal Medicine | Admitting: Internal Medicine

## 2021-04-06 ENCOUNTER — Encounter: Payer: Self-pay | Admitting: Internal Medicine

## 2021-04-06 DIAGNOSIS — Z2821 Immunization not carried out because of patient refusal: Secondary | ICD-10-CM

## 2021-04-06 DIAGNOSIS — J4540 Moderate persistent asthma, uncomplicated: Secondary | ICD-10-CM

## 2021-04-06 DIAGNOSIS — M7989 Other specified soft tissue disorders: Secondary | ICD-10-CM

## 2021-04-06 MED ORDER — ALBUTEROL SULFATE HFA 108 (90 BASE) MCG/ACT IN AERS
2.0000 | INHALATION_SPRAY | RESPIRATORY_TRACT | 6 refills | Status: DC | PRN
  Filled 2021-04-06: qty 8.5, 25d supply, fill #0
  Filled 2021-05-04 – 2021-05-05 (×2): qty 8.5, 25d supply, fill #1
  Filled 2021-05-25: qty 8.5, 25d supply, fill #2
  Filled 2021-06-11: qty 8.5, 16d supply, fill #3
  Filled 2021-07-05 (×2): qty 8.5, 16d supply, fill #4
  Filled 2021-08-03 (×2): qty 8.5, 16d supply, fill #5
  Filled 2021-09-07: qty 8.5, 16d supply, fill #6

## 2021-04-06 MED ORDER — MONTELUKAST SODIUM 10 MG PO TABS
ORAL_TABLET | Freq: Every day | ORAL | 6 refills | Status: DC
Start: 2021-04-06 — End: 2021-10-26
  Filled 2021-04-06: qty 30, 30d supply, fill #0
  Filled 2021-07-05 (×2): qty 30, 30d supply, fill #1
  Filled 2021-08-03 (×2): qty 30, 30d supply, fill #2
  Filled 2021-09-05: qty 30, 30d supply, fill #3
  Filled 2021-10-04: qty 30, 30d supply, fill #4

## 2021-04-06 MED ORDER — IPRATROPIUM-ALBUTEROL 0.5-2.5 (3) MG/3ML IN SOLN
3.0000 mL | RESPIRATORY_TRACT | 2 refills | Status: DC | PRN
  Filled 2021-04-06: qty 180, 10d supply, fill #0
  Filled 2021-10-04: qty 180, 10d supply, fill #1

## 2021-04-06 MED ORDER — FLUTICASONE-SALMETEROL 500-50 MCG/ACT IN AEPB
1.0000 | INHALATION_SPRAY | Freq: Two times a day (BID) | RESPIRATORY_TRACT | 5 refills | Status: DC
  Filled 2021-04-06: qty 60, 30d supply, fill #0
  Filled 2021-05-04 – 2021-05-05 (×2): qty 60, 30d supply, fill #1
  Filled 2021-06-07 – 2021-06-11 (×4): qty 60, 30d supply, fill #2
  Filled 2021-07-05: qty 60, 30d supply, fill #3
  Filled 2021-08-03: qty 60, 30d supply, fill #4
  Filled 2021-08-03: qty 180, 90d supply, fill #4
  Filled 2021-09-05: qty 60, 30d supply, fill #5
  Filled 2021-10-04: qty 60, 30d supply, fill #6

## 2021-04-06 NOTE — Progress Notes (Signed)
Patientirtual Visit via Video Note  I connected with Dawn Robertson on 04/06/2021 at 4:08 PM by a video enabled telemedicine application and verified that I am speaking with the correct person using two identifiers.  Location: Patient: home Provider: office   I discussed the limitations of evaluation and management by telemedicine and the availability of in person appointments. The patient expressed understanding and agreed to proceed.  History of Present Illness: Hx of Asthma moderate persistent, obesity, former tob dep, fhx of breast CA in mother, seasonal allergies and menorrhagia  I last saw patient in 2020.  States she has been trying to get visit with me but each time they would do a telehealth visit with one of our other providers.  In regards to her asthma, she feels she is not adequately controlled.  "It is a roller coaster."  She is using Advair 250/52 times a day and Singulair.  She has ran out of her DuoNeb several months ago and has had difficulty getting a refill from [place].  She is using albuterol inhaler 3-4 times a day. She has had 2 COVID vaccines but has not gotten the booster.  She does not plan to get the booster shot.  She declines the flu vaccine.  She is agreeable to getting her second pneumonia vaccine which will be the PCV 15.  Complains of having a lump on the right lateral rib cage which has been there most of last year.  Initially it was small but it has gotten bigger.  Lately she feels it has gotten a bit smaller.  She showed me using her thumb and index finger about the width of it and based on what she has showed me it is about 3 cm in size.  It does not hurt.  She states that it is soft and movable.  Outpatient Encounter Medications as of 04/06/2021  Medication Sig   albuterol (VENTOLIN HFA) 108 (90 Base) MCG/ACT inhaler Inhale 2 puffs into the lungs every 4 (four) hours as needed for wheezing or shortness of breath.   cetirizine (ZYRTEC) 10 MG tablet Take 1  tablet (10 mg total) by mouth daily. (Patient not taking: Reported on 06/04/2019)   fluticasone (FLONASE) 50 MCG/ACT nasal spray Place 2 sprays into both nostrils daily.   fluticasone-salmeterol (ADVAIR) 250-50 MCG/ACT AEPB Inhale 1 puff into the lungs 2 (two) times daily.   ibuprofen (ADVIL) 600 MG tablet Take 1 tablet (600 mg total) by mouth every 8 (eight) hours as needed. X2 days prior to menses and 2 days into cycle   ipratropium-albuterol (DUONEB) 0.5-2.5 (3) MG/3ML SOLN Take 3 mLs by nebulization every 4 (four) hours as needed. For sob.   montelukast (SINGULAIR) 10 MG tablet Take 1 tablet (10 mg total) by mouth at bedtime.   montelukast (SINGULAIR) 10 MG tablet TAKE 1 TABLET (10 MG TOTAL) BY MOUTH AT BEDTIME.   Norgestimate-Ethinyl Estradiol Triphasic (ORTHO TRI-CYCLEN LO) 0.18/0.215/0.25 MG-25 MCG tab Take 1 tablet by mouth daily.   pantoprazole (PROTONIX) 40 MG tablet Take 1 tablet (40 mg total) by mouth daily.   predniSONE (DELTASONE) 10 MG tablet Take 4 tablets daily for 5 days then stop   predniSONE (DELTASONE) 10 MG tablet TAKE 4 TABLETS BY MOUTH DAILY FOR 5 DAYS THEN STOP   Spacer/Aero-Holding Chambers (AEROCHAMBER MV) inhaler Use as instructed   Vitamin D, Ergocalciferol, (DRISDOL) 1.25 MG (50000 UT) CAPS capsule Take 1 capsule (50,000 Units total) by mouth every 7 (seven) days.   No facility-administered encounter  medications on file as of 04/06/2021.      Observations/Objective: Patient appears in no acute distress.  Assessment and Plan: 1. Moderate persistent asthma without complication Not well controlled.  We will increase the Advair to 500/50 to see if this would decrease the use of the albuterol/rescue inhaler.  Strongly encouraged her to reconsider getting the flu vaccine and the COVID booster.. - ipratropium-albuterol (DUONEB) 0.5-2.5 (3) MG/3ML SOLN; Take 3 mLs by nebulization every 4 (four) hours as needed. For sob.  Dispense: 360 mL; Refill: 2 - montelukast  (SINGULAIR) 10 MG tablet; TAKE 1 TABLET (10 MG TOTAL) BY MOUTH AT BEDTIME.  Dispense: 30 tablet; Refill: 6 - albuterol (VENTOLIN HFA) 108 (90 Base) MCG/ACT inhaler; Inhale 2 puffs into the lungs every 4 (four) hours as needed for wheezing or shortness of breath.  Dispense: 8.5 g; Refill: 6 - fluticasone-salmeterol (ADVAIR DISKUS) 500-50 MCG/ACT AEPB; Inhale 1 puff into the lungs in the morning and at bedtime.  Dispense: 3 each; Refill: 5  2. Mass of soft tissue of chest Will have a follow-up with [place] within the next 1 to 4 weeks to evaluate this.  Sounds as though it is a lipoma.  3. Influenza vaccination declined   Follow Up Instructions: 1-4 wks   I discussed the assessment and treatment plan with the patient. The patient was provided an opportunity to ask questions and all were answered. The patient agreed with the plan and demonstrated an understanding of the instructions.   The patient was advised to call back or seek an in-person evaluation if the symptoms worsen or if the condition fails to improve as anticipated.  I spent 18 minutes dedicated to the care of this patient on the date of this encounter to include previsit review of chart, face-to-face time with the patient discussing diagnosis and management and post visit ordering of medications.    Jonah Blue, MD

## 2021-04-07 ENCOUNTER — Telehealth: Payer: Self-pay

## 2021-04-07 ENCOUNTER — Other Ambulatory Visit: Payer: Self-pay

## 2021-04-07 NOTE — Telephone Encounter (Signed)
T has been informed of upcoming appointment.

## 2021-04-07 NOTE — Telephone Encounter (Signed)
-----   Message from [name], MD sent at 04/06/2021  6:01 PM EST ----- Patient needs in person visit in 1 to 4 weeks with me for evaluation of lump on her chest.

## 2021-04-08 ENCOUNTER — Other Ambulatory Visit: Payer: Self-pay

## 2021-04-27 ENCOUNTER — Ambulatory Visit: Payer: Self-pay | Admitting: Internal Medicine

## 2021-05-04 ENCOUNTER — Other Ambulatory Visit (HOSPITAL_COMMUNITY): Payer: Self-pay

## 2021-05-05 ENCOUNTER — Encounter: Payer: Self-pay | Admitting: Internal Medicine

## 2021-05-05 ENCOUNTER — Other Ambulatory Visit (HOSPITAL_COMMUNITY): Payer: Self-pay

## 2021-05-05 ENCOUNTER — Other Ambulatory Visit: Payer: Self-pay

## 2021-05-07 ENCOUNTER — Other Ambulatory Visit (HOSPITAL_COMMUNITY): Payer: Self-pay

## 2021-05-26 ENCOUNTER — Other Ambulatory Visit: Payer: Self-pay

## 2021-05-26 ENCOUNTER — Other Ambulatory Visit (HOSPITAL_COMMUNITY): Payer: Self-pay

## 2021-06-02 ENCOUNTER — Other Ambulatory Visit (HOSPITAL_COMMUNITY): Payer: Self-pay

## 2021-06-07 ENCOUNTER — Other Ambulatory Visit (HOSPITAL_COMMUNITY): Payer: Self-pay

## 2021-06-08 ENCOUNTER — Other Ambulatory Visit: Payer: Self-pay

## 2021-06-08 ENCOUNTER — Other Ambulatory Visit (HOSPITAL_COMMUNITY): Payer: Self-pay

## 2021-06-08 ENCOUNTER — Other Ambulatory Visit: Payer: Self-pay | Admitting: Internal Medicine

## 2021-06-08 DIAGNOSIS — J4540 Moderate persistent asthma, uncomplicated: Secondary | ICD-10-CM

## 2021-06-09 ENCOUNTER — Other Ambulatory Visit: Payer: Self-pay

## 2021-06-09 ENCOUNTER — Encounter: Payer: Self-pay | Admitting: Internal Medicine

## 2021-06-09 ENCOUNTER — Other Ambulatory Visit (HOSPITAL_COMMUNITY): Payer: Self-pay

## 2021-06-09 NOTE — Telephone Encounter (Signed)
Hey Dawn Robertson would you be able to look into this please  ?

## 2021-06-09 NOTE — Telephone Encounter (Signed)
Requested medication (s) are due for refill today:   Yes ? ?Requested medication (s) are on the active medication list:   Yes ? ?Future visit scheduled:   Yes ? ? ?Last ordered: 04/06/2021 #180, 5 refills ? ?Returned because her insurance does not cover this.   See pharmacy note.    ? ?Requested Prescriptions  ?Pending Prescriptions Disp Refills  ? fluticasone-salmeterol (ADVAIR DISKUS) 500-50 MCG/ACT AEPB 180 each 5  ?  Sig: Inhale 1 puff into the lungs in the morning and at bedtime.  ?  ? Pulmonology:  Combination Products Passed - 06/08/2021  4:59 PM  ?  ?  Passed - Valid encounter within last 12 months  ?  Recent Outpatient Visits   ? ?      ? 2 months ago Moderate persistent asthma without complication  ? [place] Karle Plumber B, MD  ? 8 months ago Moderate persistent asthma without complication  ? Primary Care at [facility], [place], [place]  ? 2 years ago Moderate persistent asthma with acute exacerbation  ? [place] Elsie Stain, MD  ? 2 years ago Nausea  ? [facility] [place], [place], [place]  ? 2 years ago Moderate persistent asthma without complication  ? [place] Elsie Stain, MD  ? ?  ?  ?Future Appointments   ? ?        ? In 1 week Ladell Pier, MD [place]  ? ?  ? ?  ?  ?  ? ?

## 2021-06-10 ENCOUNTER — Ambulatory Visit: Payer: Self-pay

## 2021-06-10 ENCOUNTER — Telehealth: Payer: Self-pay | Admitting: Internal Medicine

## 2021-06-10 ENCOUNTER — Other Ambulatory Visit: Payer: Self-pay

## 2021-06-10 NOTE — Telephone Encounter (Signed)
Pt sent a MyChart message yesterday in regards to this.  ?

## 2021-06-10 NOTE — Telephone Encounter (Signed)
Copied from CRM [id number]. Topic: General - Other >> Jun 10, 2021  2:08 PM Traci Sermon wrote: Reason for CRM: Pt called in to give information to [facility], pt states she was advised by her to cancel her insurance and reach back out to her, I was unable to reach someone in the office, pt requesting a call back.   Per encounter from Jay'A:  "Reached out to [facility] in regards to this and per kelly   "she has a $4000 deductible so all her inhalers are over $250.  She can cancel her ins and show [place] documentation that she canceled it and then we can help.  She can call her ins, but I ran test claims for everything I could think of and they were all the same.  They are all 'covered' just going towards her ridiculous deductible."  Contacted pt and went over [place] response and pt states she understands and doesn't have any questions or concerns"

## 2021-06-10 NOTE — Telephone Encounter (Signed)
Pt stated she has been without her Rx for fluticasone-salmeterol (ADVAIR DISKUS) 500-50 MCG/ACT AEPB for 2 days now and she would like for a nurse to call her back to discuss. Cb# [phone]  ? ? ? ?Chief Complaint: Pt. Has been out her Advair inhaler. Insurance is not paying for it per pt. ?Symptoms: Has asthma ?Frequency: n/a ?Pertinent Negatives: Patient denies n/a ?Disposition: [] ED /[] Urgent Care (no appt availability in office) / [] Appointment(In office/virtual)/ []  [facility]/ [] Home Care/ [] Refused Recommended Disposition /[] [facility]/ []  Follow-up with PCP ?Additional Notes: Pt. Asking for assistance with this. Please advise. ?Answer Assessment - Initial Assessment Questions ?1. DRUG NAME: "What medicine do you need to have refilled?" ?    Advair inhaler ?2. REFILLS REMAINING: "How many refills are remaining?" (Note: The label on the medicine or pill bottle will show how many refills are remaining. If there are no refills remaining, then a renewal may be needed.) ?    0 ?3. EXPIRATION DATE: "What is the expiration date?" (Note: The label states when the prescription will expire, and thus can no longer be refilled.) ?    0 ?4. PRESCRIBING HCP: "Who prescribed it?" Reason: If prescribed by specialist, call should be referred to that group. ?    Dr. ?5. SYMPTOMS: "Do you have any symptoms?" ?    No ?6. PREGNANCY: "Is there any chance that you are pregnant?" "When was your last menstrual period?" ?    No ? ?Protocols used: Medication Refill and Renewal Call-A-AH ? ?

## 2021-06-10 NOTE — Telephone Encounter (Signed)
Returned pt call. Pt states she got her insurance canceled. Made pt aware that she will need to bring information to the pharmacy. Pt states she understands  ?

## 2021-06-10 NOTE — Telephone Encounter (Signed)
Reached out to [name] in regards to this and per kelly  ? ?"she has a $4000 deductible so all her inhalers are over $250.  She can cancel her ins and show [place] documentation that she canceled it and then we can help.  She can call her ins, but I ran test claims for everything I could think of and they were all the same.  They are all 'covered' just going towards her ridiculous deductible." ? ?Contacted pt and went over [name] and pt states she understands and doesn't have any questions or concerns  ?

## 2021-06-11 ENCOUNTER — Other Ambulatory Visit (HOSPITAL_COMMUNITY): Payer: Self-pay

## 2021-06-11 ENCOUNTER — Other Ambulatory Visit: Payer: Self-pay

## 2021-06-17 ENCOUNTER — Encounter: Payer: Self-pay | Admitting: Internal Medicine

## 2021-06-17 ENCOUNTER — Other Ambulatory Visit: Payer: Self-pay

## 2021-06-17 ENCOUNTER — Ambulatory Visit: Payer: Self-pay | Attending: Internal Medicine | Admitting: Internal Medicine

## 2021-06-17 VITALS — BP 109/75 | HR 88 | Resp 16 | Wt 195.2 lb

## 2021-06-17 DIAGNOSIS — J4540 Moderate persistent asthma, uncomplicated: Secondary | ICD-10-CM

## 2021-06-17 DIAGNOSIS — E669 Obesity, unspecified: Secondary | ICD-10-CM

## 2021-06-17 DIAGNOSIS — M7989 Other specified soft tissue disorders: Secondary | ICD-10-CM

## 2021-06-17 MED ORDER — PREDNISONE 20 MG PO TABS
ORAL_TABLET | ORAL | 0 refills | Status: DC
  Filled 2021-06-17: qty 6, 6d supply, fill #0

## 2021-06-17 NOTE — Progress Notes (Addendum)
? ? ?Patient ID: Dawn Robertson, female    DOB: [birth year]  MRN: [id number] ? ?CC: Asthma, Insomnia (/), Cough, and Cyst (On the right side ) ? ? ?Subjective: ?Dawn Robertson is a 36 y.o. female who presents for chronic ds management ?Her concerns today include:  ?Hx of Asthma moderate persistent, obesity, former tob dep, fhx of breast CA in mother, seasonal allergies and menorrhagia ? ?On last visit, patient reported a lump around her right rib cage.  We had planned to look at this today.  The lump has been there for most of last year.  Initially it was small but it has gotten bigger.  It does not hurt. ? ?Asthma:  Advair increase to 500/50 on last visit. ?Still waking up during the nights coughing.  Using Duomeb 2x/nights for past 2wks.  Also taking a supplement called Mullein to help decrease mucus and cough. Takes it as a herbal tea.    Took it 3 wks ago for 1 wk and found she was not having to wake up at nights with cough.  Feels she is having a flair up currently ?Taking Singulair every night ?If weather is cold, or raining she has to use rescue inh more ?Still tob free ? ?Wgh: Has not worked on it in a while because other things got in the way.  She plans to get back on track.  She recently purchased a juicer so that she can do fruits/vegetable smoothies.  Also started eating healthier meats like fish. ?Patient Active Problem List  ? Diagnosis Date Noted  ? Primary dysmenorrhea 04/18/2019  ? Gastroesophageal reflux disease without esophagitis 07/10/2018  ? Former smoker 11/14/2016  ? Obesity (BMI 30.0-34.9) 11/14/2016  ? Anxiety state 11/14/2016  ? Positive depression screening 11/14/2016  ? Family history of breast cancer in mother 09/18/2015  ? Moderate persistent asthma   ?  ? ?Current Outpatient Medications on File Prior to Visit  ?Medication Sig Dispense Refill  ? albuterol (VENTOLIN HFA) 108 (90 Base) MCG/ACT inhaler Inhale 2 puffs into the lungs every 4 (four) hours as needed for wheezing or  shortness of breath. 8.5 g 6  ? fluticasone (FLONASE) 50 MCG/ACT nasal spray Place 2 sprays into both nostrils daily. 16 g 6  ? fluticasone-salmeterol (ADVAIR DISKUS) 500-50 MCG/ACT AEPB Inhale 1 puff into the lungs in the morning and at bedtime. 180 each 5  ? ibuprofen (ADVIL) 600 MG tablet Take 1 tablet (600 mg total) by mouth every 8 (eight) hours as needed. X2 days prior to menses and 2 days into cycle 90 tablet 1  ? ipratropium-albuterol (DUONEB) 0.5-2.5 (3) MG/3ML SOLN Take 3 mLs by nebulization every 4 (four) hours as needed. For shortness of breath. 360 mL 2  ? montelukast (SINGULAIR) 10 MG tablet TAKE 1 TABLET (10 MG TOTAL) BY MOUTH AT BEDTIME. 30 tablet 6  ? pantoprazole (PROTONIX) 40 MG tablet Take 1 tablet (40 mg total) by mouth daily. 30 tablet 3  ? Spacer/Aero-Holding Chambers (AEROCHAMBER MV) inhaler Use as instructed 1 each 0  ? Vitamin D, Ergocalciferol, (DRISDOL) 1.25 MG (50000 UT) CAPS capsule Take 1 capsule (50,000 Units total) by mouth every 7 (seven) days. 16 capsule 0  ? ?No current facility-administered medications on file prior to visit.  ? ? ?No Known Allergies ? ?Social History  ? ?Socioeconomic History  ? Marital status: Single  ?  Spouse name: Not on file  ? Number of children: Not on file  ? Years of education: Not on  file  ? Highest education level: Not on file  ?Occupational History  ? Not on file  ?Tobacco Use  ? Smoking status: Former  ?  Types: Cigarettes  ?  Quit date: 03/28/2013  ?  Years since quitting: 8.2  ? Smokeless tobacco: Never  ? Tobacco comments:  ?  hx of 1 pk every 3 days  ?Substance and Sexual Activity  ? Alcohol use: Yes  ?  Alcohol/week: 0.0 standard drinks  ?  Comment: daily glass of wine  ? Drug use: No  ? Sexual activity: Not Currently  ?  Birth control/protection: None  ?Other Topics Concern  ? Not on file  ?Social History Narrative  ? Not on file  ? ?Social Determinants of Health  ? ?Financial Resource Strain: Not on file  ?Food Insecurity: Not on file   ?Transportation Needs: Not on file  ?Physical Activity: Not on file  ?Stress: Not on file  ?Social Connections: Not on file  ?Intimate Partner Violence: Not on file  ? ? ?Family History  ?Problem Relation Age of Onset  ? Asthma Mother   ? Breast cancer Mother 51  ?     s/p surgery and radiation  ? Other Mother   ?     hx of hysterectomy for bleeding/blood clotting in her 30s  ? Asthma Father   ? Diabetes type II Paternal Aunt   ? Prostate cancer Other   ?     maternal great uncle (MGM's brother) d. later age  ? ? ?No past surgical history on file. ? ?ROS: ?Review of Systems ?Negative except as stated above ? ?PHYSICAL EXAM: ?BP 109/75   Pulse 88   Resp 16   Wt 195 lb 3.2 oz (88.5 kg)   SpO2 96%   BMI 32.48 kg/m?   ?Wt Readings from Last 3 Encounters:  ?06/17/21 195 lb 3.2 oz (88.5 kg)  ?06/04/19 182 lb (82.6 kg)  ?04/18/19 175 lb 6.4 oz (79.6 kg)  ? ? ?Physical Exam ? ?General appearance - alert, well appearing, and in no distress ?Mental status - normal mood, behavior, speech, dress, motor activity, and thought processes ?Neck - supple, no significant adenopathy ?Chest -good air entry.  Slight expiratory wheezes scattered. ?Heart - normal rate, regular rhythm, normal S1, S2, no murmurs, rubs, clicks or gallops ?Extremities - peripheral pulses normal, no pedal edema, no clubbing or cyanosis ?Skin -5 to 6 cm firm movable mass felt right posterior thorax below the bra line.  It is not tender to touch. ? ? ? ?  Latest Ref Rng & Units 03/06/2019  ?  4:55 PM 04/25/2017  ? 11:57 PM 11/28/2014  ?  5:05 AM  ?CMP  ?Glucose 65 - 99 mg/dL 90   94   938    ?BUN 6 - 20 mg/dL 9   10   8     ?Creatinine 0.57 - 1.00 mg/dL   1.82   9.93    ?Sodium 134 - 144 mmol/L 136   139   136    ?Potassium 3.5 - 5.2 mmol/L 4.0   3.1   4.2    ?Chloride 96 - 106 mmol/L 102   104   109    ?CO2 20 - 29 mmol/L 22   26   22     ?Calcium 8.7 - 10.2 mg/dL 9.9   9.0   9.1    ?Total Protein 6.0 - 8.5 g/dL 7.9      ?Total Bilirubin 0.0 - 1.2 mg/dL  0.5       ?Alkaline Phos 39 - 117 IU/L 60      ?AST 0 - 40 IU/L 11      ?ALT 0 - 32 IU/L 9      ? ?Lipid Panel  ?No results found for: CHOL, TRIG, HDL, CHOLHDL, VLDL, LDLCALC, LDLDIRECT ? ?CBC ?   ?Component Value Date/Time  ? WBC 8.0 03/06/2019 1655  ? WBC 9.9 04/25/2017 2357  ? RBC 4.51 03/06/2019 1655  ? RBC 4.11 04/25/2017 2357  ? HGB 15.2 03/06/2019 1655  ? HCT 45.3 03/06/2019 1655  ? PLT 242 03/06/2019 1655  ? MCV 100 (H) 03/06/2019 1655  ? MCH 33.7 (H) 03/06/2019 1655  ? MCH 34.3 (H) 04/25/2017 2357  ? MCHC 33.6 03/06/2019 1655  ? MCHC 35.3 04/25/2017 2357  ? RDW 11.5 (L) 03/06/2019 1655  ? LYMPHSABS 3.5 (H) 03/06/2019 1655  ? MONOABS 1.3 (H) 04/25/2017 2357  ? EOSABS 0.2 03/06/2019 1655  ? BASOSABS 0.1 03/06/2019 1655  ? ? ?ASSESSMENT AND PLAN: ?1. Moderate persistent asthma without complication ?Not as well-controlled as she could be.  She is on maximum dose of Advair and taking it consistently.  Seems to be having a flare over the past 2 weeks with her having to wake up through the night to use her nebulizer.  We will give a prednisone taper.  Continue Singulair.  We will try to get her in with Dr. Delford FieldWright within the next several weeks to see what other measures can be done to maximize control of her asthma. ? ?2. Mass of soft tissue of chest ?Likely lipoma or large cyst.  Recommend getting CAT scan or ultrasound to evaluate further.  However she is uninsured and concerned about out of pocket cost.  Advised her how to apply for the orange card/[facility] discount card.  Once approved, she should let me know so that we can order the study. ? ?3. Obesity (BMI 30.0-34.9) ?Patient advised to eliminate sugary drinks from the diet, cut back on portion sizes especially of white carbohydrates, eat more white lean meat like chicken [place]urkey and seafood instead of beef or pork and incorporate fresh fruits and vegetables into the diet daily. ? ?Addendum 06/18/2021: I received message from pharmacy that the Advair inhaler is no  longer available for this patient through the patient assistance program.  She picked up her last prescription of Advair.  They wanted to know whether she can be changed to Symbicort or Breo.  I have changed it to [employer]

## 2021-06-17 NOTE — Patient Instructions (Signed)
Please apply for the [facility] Card/[facility] discount card.  Once approved, let me know so that we can order the imaging study on your back/chest. ?

## 2021-06-18 ENCOUNTER — Other Ambulatory Visit: Payer: Self-pay

## 2021-06-18 MED ORDER — BUDESONIDE-FORMOTEROL FUMARATE 160-4.5 MCG/ACT IN AERO
2.0000 | INHALATION_SPRAY | Freq: Two times a day (BID) | RESPIRATORY_TRACT | 6 refills | Status: DC
  Filled 2021-06-18 – 2021-07-05 (×2): qty 10.2, 30d supply, fill #0
  Filled 2021-07-05 – 2021-09-07 (×3): qty 10.2, fill #0

## 2021-06-18 NOTE — Addendum Note (Signed)
Addended by: Jonah Blue B on: 06/18/2021 09:22 AM ? ? Modules accepted: Orders ? ?

## 2021-07-05 ENCOUNTER — Other Ambulatory Visit: Payer: Self-pay

## 2021-07-05 ENCOUNTER — Other Ambulatory Visit (HOSPITAL_COMMUNITY): Payer: Self-pay

## 2021-08-03 ENCOUNTER — Other Ambulatory Visit (HOSPITAL_COMMUNITY): Payer: Self-pay

## 2021-08-03 ENCOUNTER — Other Ambulatory Visit: Payer: Self-pay

## 2021-08-05 ENCOUNTER — Other Ambulatory Visit: Payer: Self-pay

## 2021-08-25 ENCOUNTER — Ambulatory Visit: Payer: Self-pay | Admitting: Critical Care Medicine

## 2021-09-06 ENCOUNTER — Other Ambulatory Visit: Payer: Self-pay

## 2021-09-07 ENCOUNTER — Other Ambulatory Visit: Payer: Self-pay

## 2021-09-07 ENCOUNTER — Other Ambulatory Visit: Payer: Self-pay | Admitting: Internal Medicine

## 2021-09-07 DIAGNOSIS — J4540 Moderate persistent asthma, uncomplicated: Secondary | ICD-10-CM

## 2021-09-07 MED ORDER — ALBUTEROL SULFATE HFA 108 (90 BASE) MCG/ACT IN AERS
2.0000 | INHALATION_SPRAY | RESPIRATORY_TRACT | 0 refills | Status: DC | PRN
  Filled 2021-09-07: qty 8.5, 16d supply, fill #0

## 2021-09-07 NOTE — Telephone Encounter (Signed)
Requested Prescriptions  Pending Prescriptions Disp Refills  . albuterol (VENTOLIN HFA) 108 (90 Base) MCG/ACT inhaler 8.5 g 0    Sig: Inhale 2 puffs into the lungs every 4 (four) hours as needed for wheezing or shortness of breath.     Pulmonology:  Beta Agonists 2 Passed - 09/07/2021  9:44 AM      Passed - Last BP in normal range    BP Readings from Last 1 Encounters:  06/17/21 109/75         Passed - Last Heart Rate in normal range    Pulse Readings from Last 1 Encounters:  06/17/21 88         Passed - Valid encounter within last 12 months    Recent Outpatient Visits          2 months ago Moderate persistent asthma without complication   [place] [facility] Jonah Blue B, MD   5 months ago Moderate persistent asthma without complication   [place] [facility] [name], MD   11 months ago Moderate persistent asthma without complication   [facility] at [facility], [place], [name]   2 years ago Moderate persistent asthma with acute exacerbation   [facility] [name], MD   2 years ago Nausea   [facility] [place], [place]zella [place], [name]      Future Appointments            In 1 month Laural Benes Binnie Rail, MD [facility]   In 1 month Delford Field, Charlcie Cradle, MD [facility]

## 2021-10-04 ENCOUNTER — Encounter: Payer: Self-pay | Admitting: Internal Medicine

## 2021-10-04 ENCOUNTER — Other Ambulatory Visit: Payer: Self-pay | Admitting: Internal Medicine

## 2021-10-04 ENCOUNTER — Other Ambulatory Visit: Payer: Self-pay

## 2021-10-04 DIAGNOSIS — J4540 Moderate persistent asthma, uncomplicated: Secondary | ICD-10-CM

## 2021-10-04 MED ORDER — ALBUTEROL SULFATE HFA 108 (90 BASE) MCG/ACT IN AERS
2.0000 | INHALATION_SPRAY | RESPIRATORY_TRACT | 10 refills | Status: DC | PRN
  Filled 2021-10-04: qty 8.5, 16d supply, fill #0
  Filled 2021-10-26: qty 8.5, 16d supply, fill #1
  Filled 2021-11-23: qty 8.5, 16d supply, fill #2

## 2021-10-05 ENCOUNTER — Other Ambulatory Visit: Payer: Self-pay

## 2021-10-18 ENCOUNTER — Ambulatory Visit: Payer: Self-pay | Admitting: Internal Medicine

## 2021-10-26 ENCOUNTER — Encounter: Payer: Self-pay | Admitting: Critical Care Medicine

## 2021-10-26 ENCOUNTER — Other Ambulatory Visit: Payer: Self-pay

## 2021-10-26 ENCOUNTER — Ambulatory Visit: Payer: Self-pay | Attending: Critical Care Medicine | Admitting: Critical Care Medicine

## 2021-10-26 VITALS — BP 121/83 | HR 92 | Ht 60.0 in | Wt 191.4 lb

## 2021-10-26 DIAGNOSIS — J4540 Moderate persistent asthma, uncomplicated: Secondary | ICD-10-CM

## 2021-10-26 DIAGNOSIS — E669 Obesity, unspecified: Secondary | ICD-10-CM

## 2021-10-26 DIAGNOSIS — K219 Gastro-esophageal reflux disease without esophagitis: Secondary | ICD-10-CM

## 2021-10-26 MED ORDER — PREDNISONE 10 MG PO TABS
ORAL_TABLET | ORAL | 0 refills | Status: DC
  Filled 2021-10-26: qty 20, 5d supply, fill #0

## 2021-10-26 MED ORDER — DOXYCYCLINE HYCLATE 100 MG PO TABS
100.0000 mg | ORAL_TABLET | Freq: Two times a day (BID) | ORAL | 0 refills | Status: AC
  Filled 2021-10-26: qty 10, 5d supply, fill #0

## 2021-10-26 MED ORDER — MONTELUKAST SODIUM 10 MG PO TABS
ORAL_TABLET | Freq: Every day | ORAL | 6 refills | Status: DC
  Filled 2021-10-26: qty 30, 30d supply, fill #0
  Filled 2022-01-05: qty 30, 30d supply, fill #1
  Filled 2022-02-21: qty 30, 30d supply, fill #2

## 2021-10-26 MED ORDER — PANTOPRAZOLE SODIUM 40 MG PO TBEC
40.0000 mg | DELAYED_RELEASE_TABLET | Freq: Every day | ORAL | 3 refills | Status: DC
  Filled 2021-10-26: qty 30, 30d supply, fill #0
  Filled 2022-02-21: qty 30, 30d supply, fill #1

## 2021-10-26 MED ORDER — FLUTICASONE-SALMETEROL 500-50 MCG/ACT IN AEPB
1.0000 | INHALATION_SPRAY | Freq: Two times a day (BID) | RESPIRATORY_TRACT | 5 refills | Status: DC
  Filled 2021-10-26: qty 60, 30d supply, fill #0
  Filled 2021-11-23 – 2021-11-24 (×3): qty 60, 30d supply, fill #1
  Filled 2022-01-05: qty 180, 90d supply, fill #2

## 2021-10-26 NOTE — Assessment & Plan Note (Signed)
Reflux playing a role patient to begin pantoprazole daily and we also went over with her lifestyle management handout  The following Lifestyle Medicine recommendations according to American College of Lifestyle Medicine [facility]) were discussed and offered to patient who agrees to start the journey:  A. Whole Foods, Plant-based plate comprising of fruits and vegetables, plant-based proteins, whole-grain carbohydrates was discussed in detail with the patient.   A list for source of those nutrients were also provided to the patient.  Patient will use only water or unsweetened tea for hydration. B.  The need to stay away from risky substances including alcohol, smoking; obtaining 7 to 9 hours of restorative sleep, at least 150 minutes of moderate intensity exercise weekly, the importance of healthy social connections,  and stress reduction techniques were discussed.

## 2021-10-26 NOTE — Progress Notes (Signed)
Established Patient Office Visit  Subjective   Patient ID: Dawn Robertson, female    DOB: 11-23-1985  Age: 36 y.o. MRN: [id number]  Chief Complaint  Patient presents with   Asthma    This is a pleasant 36 year old female not seen since 2021 here for asthma evaluation.  On arrival blood pressure 121/83.  She now works from home and is about to obtain health insurance.  She rarely goes outside of her room.  She is planning on trip to [facility] upcoming in September.  She had mold in her previous apartment she is now in a clean environment.  She does occasionally burn incense.  Patient did have to go to urgent care in May with an acute bronchitic spell she improved with prednisone and azithromycin.  Below is assessment from her asthma.  Asthma She complains of chest tightness, cough, difficulty breathing, shortness of breath, sputum production and wheezing. There is no frequent throat clearing, hemoptysis or hoarse voice. Primary symptoms comments: Mucus is white to yellow. This is a chronic problem. The current episode started more than 1 year ago. The problem occurs constantly (qhs cough mucus and choking). The problem has been rapidly worsening. The cough is productive of sputum, productive, hacking and supine. Associated symptoms include heartburn. Pertinent negatives include no chest pain, fever, headaches, malaise/fatigue, myalgias, PND, sore throat or weight loss. Associated symptoms comments: Productive cough. Her symptoms are aggravated by lying down, change in weather, exposure to smoke, exposure to fumes, pollen and emotional stress (laughing hard). Her symptoms are alleviated by beta-agonist and change in position. She reports moderate improvement on treatment. Risk factors for lung disease include animal exposure. Her past medical history is significant for asthma. There is no history of pneumonia.      Review of Systems  Constitutional:  Negative for chills, diaphoresis, fever,  malaise/fatigue and weight loss.  HENT:  Positive for congestion and sinus pain. Negative for hearing loss, hoarse voice, nosebleeds, sore throat and tinnitus.   Eyes:  Negative for blurred vision, photophobia and redness.  Respiratory:  Positive for cough, sputum production, shortness of breath and wheezing. Negative for hemoptysis and stridor.   Cardiovascular:  Negative for chest pain, palpitations, orthopnea, claudication, leg swelling and PND.  Gastrointestinal:  Positive for heartburn. Negative for abdominal pain, blood in stool, constipation, diarrhea, nausea and vomiting.  Genitourinary:  Negative for dysuria, flank pain, frequency, hematuria and urgency.  Musculoskeletal:  Negative for back pain, falls, joint pain, myalgias and neck pain.  Skin:  Negative for itching and rash.  Neurological:  Negative for dizziness, tingling, tremors, sensory change, speech change, focal weakness, seizures, loss of consciousness, weakness and headaches.  Endo/Heme/Allergies:  Negative for environmental allergies and polydipsia. Does not bruise/bleed easily.  Psychiatric/Behavioral:  Negative for depression, memory loss, substance abuse and suicidal ideas. The patient is not nervous/anxious and does not have insomnia.       Objective:     BP 121/83   Pulse 92   Ht 5' (1.524 m)   Wt 191 lb 6.4 oz (86.8 kg)   SpO2 95%   BMI 37.38 kg/m    Physical Exam Vitals reviewed.  Constitutional:      Appearance: Normal appearance. She is well-developed. She is obese. She is not diaphoretic.  HENT:     Head: Normocephalic and atraumatic.     Nose: Congestion and rhinorrhea present. No nasal deformity, septal deviation or mucosal edema.     Right Sinus: No maxillary sinus tenderness  or frontal sinus tenderness.     Left Sinus: No maxillary sinus tenderness or frontal sinus tenderness.     Mouth/Throat:     Mouth: Mucous membranes are moist.     Pharynx: Oropharynx is clear. No oropharyngeal exudate.   Eyes:     General: No scleral icterus.    Conjunctiva/sclera: Conjunctivae normal.     Pupils: Pupils are equal, round, and reactive to light.  Neck:     Thyroid: No thyromegaly.     Vascular: No carotid bruit or JVD.     Trachea: Trachea normal. No tracheal tenderness or tracheal deviation.  Cardiovascular:     Rate and Rhythm: Normal rate and regular rhythm.     Chest Wall: PMI is not displaced.     Pulses: Normal pulses. No decreased pulses.     Heart sounds: Normal heart sounds, S1 normal and S2 normal. Heart sounds not distant. No murmur heard.    No systolic murmur is present.     No diastolic murmur is present.     No friction rub. No gallop. No S3 or S4 sounds.  Pulmonary:     Effort: Pulmonary effort is normal. No tachypnea, accessory muscle usage or respiratory distress.     Breath sounds: Normal breath sounds. No stridor. No decreased breath sounds, wheezing, rhonchi or rales.  Chest:     Chest wall: No tenderness.  Abdominal:     General: Bowel sounds are normal. There is no distension.     Palpations: Abdomen is soft. Abdomen is not rigid.     Tenderness: There is no abdominal tenderness. There is no guarding or rebound.  Musculoskeletal:        General: Normal range of motion.     Cervical back: Normal range of motion and neck supple. No edema, erythema or rigidity. No muscular tenderness. Normal range of motion.  Lymphadenopathy:     Head:     Right side of head: No submental or submandibular adenopathy.     Left side of head: No submental or submandibular adenopathy.     Cervical: No cervical adenopathy.  Skin:    General: Skin is warm and dry.     Coloration: Skin is not pale.     Findings: No rash.     Nails: There is no clubbing.  Neurological:     Mental Status: She is alert and oriented to person, place, and time.     Sensory: No sensory deficit.  Psychiatric:        Speech: Speech normal.        Behavior: Behavior normal.    Went over with the  patient a peak flow meter she has been using this before today her rate was 325  No results found for any visits on 10/26/21.    The ASCVD Risk score (Arnett DK, et al., 2019) failed to calculate for the following reasons:   The 2019 ASCVD risk score is only valid for ages 47 to 24    Assessment & Plan:   Problem List Items Addressed This Visit       Respiratory   Moderate persistent asthma (Chronic)    Patient currently is stable she is on the Advair 500 strength 1 puff twice daily I reinstructed her as to his proper use also she will stay on the Singulair and albuterol as needed and DuoNeb by nebulization as needed  We will obtain a complete blood count to include absolute eosinophil count and also will obtain  IgE levels  Patient is planning a trip to [facility] I will give the patient a prescription for prednisone and doxycycline to take on the trip she will inform me if she has to take it while out west  Return in 2 months  Patient given new peak flow meter peak flow rate today was 325 which is baseline for her      Relevant Medications   fluticasone-salmeterol (ADVAIR DISKUS) 500-50 MCG/ACT AEPB   montelukast (SINGULAIR) 10 MG tablet   predniSONE (DELTASONE) 10 MG tablet   Other Relevant Orders   CBC with Differential/Platelet   IgE     Digestive   Gastroesophageal reflux disease without esophagitis    Reflux playing a role patient to begin pantoprazole daily and we also went over with her lifestyle management handout  The following Lifestyle Medicine recommendations according to American College of Lifestyle Medicine [facility]) were discussed and offered to patient who agrees to start the journey:  A. Whole Foods, Plant-based plate comprising of fruits and vegetables, plant-based proteins, whole-grain carbohydrates was discussed in detail with the patient.   A list for source of those nutrients were also provided to the patient.  Patient will use only water or unsweetened  tea for hydration. B.  The need to stay away from risky substances including alcohol, smoking; obtaining 7 to 9 hours of restorative sleep, at least 150 minutes of moderate intensity exercise weekly, the importance of healthy social connections,  and stress reduction techniques were discussed.        Relevant Medications   pantoprazole (PROTONIX) 40 MG tablet     Other   Obesity (BMI 30.0-34.9) (Chronic)    The following Lifestyle Medicine recommendations according to American College of Lifestyle Medicine [facility]) were discussed and offered to patient who agrees to start the journey:  A. Whole Foods, Plant-based plate comprising of fruits and vegetables, plant-based proteins, whole-grain carbohydrates was discussed in detail with the patient.   A list for source of those nutrients were also provided to the patient.  Patient will use only water or unsweetened tea for hydration. B.  The need to stay away from risky substances including alcohol, smoking; obtaining 7 to 9 hours of restorative sleep, at least 150 minutes of moderate intensity exercise weekly, the importance of healthy social connections,  and stress reduction techniques were discussed.      38 minutes spent assessing patient complex decision making is high  Return in about 2 months (around 12/26/2021).    Shan Levans, MD

## 2021-10-26 NOTE — Assessment & Plan Note (Signed)
Patient currently is stable she is on the Advair 500 strength 1 puff twice daily I reinstructed her as to his proper use also she will stay on the Singulair and albuterol as needed and DuoNeb by nebulization as needed  We will obtain a complete blood count to include absolute eosinophil count and also will obtain IgE levels  Patient is planning a trip to [facility] I will give the patient a prescription for prednisone and doxycycline to take on the trip she will inform me if she has to take it while out west  Return in 2 months  Patient given new peak flow meter peak flow rate today was 325 which is baseline for her

## 2021-10-26 NOTE — Patient Instructions (Signed)
Prednisone and doxycycline were given fill both of those and take those with you on your trip use those if you should have a flareup of your asthma let us know and return if you had to take those medicines  Stay on Advair twice daily stay on the Singulair as prescribed use the spacer device on your albuterol  Let us know when you achieve insurance we can change her inhaler to a more effective medication  Resume stomach acid medicine pantoprazole daily this was sent to the pharmacy downstairs  Follow the lifestyle medicine handout for more of a plant-based diet this will improve your to digest and a few food and result in improved asthma control  Labs today include blood counts and IgE level for allergies  Return to see Dr. Delford Field 2 months

## 2021-10-26 NOTE — Assessment & Plan Note (Signed)
The following Lifestyle Medicine recommendations according to American College of Lifestyle Medicine ([facility]) were discussed and offered to patient who agrees to start the journey:  A. Whole Foods, Plant-based plate comprising of fruits and vegetables, plant-based proteins, whole-grain carbohydrates was discussed in detail with the patient.   A list for source of those nutrients were also provided to the patient.  Patient will use only water or unsweetened tea for hydration. B.  The need to stay away from risky substances including alcohol, smoking; obtaining 7 to 9 hours of restorative sleep, at least 150 minutes of moderate intensity exercise weekly, the importance of healthy social connections,  and stress reduction techniques were discussed. 

## 2021-10-29 ENCOUNTER — Telehealth: Payer: Self-pay

## 2021-10-29 DIAGNOSIS — J4540 Moderate persistent asthma, uncomplicated: Secondary | ICD-10-CM

## 2021-10-29 LAB — CBC WITH DIFFERENTIAL/PLATELET
Basophils Absolute: 0.1 10*3/uL (ref 0.0–0.2)
Basos: 1 %
EOS (ABSOLUTE): 0.2 10*3/uL (ref 0.0–0.4)
Eos: 3 %
Hematocrit: 39.7 % (ref 34.0–46.6)
Hemoglobin: 13 g/dL (ref 11.1–15.9)
Immature Grans (Abs): 0 10*3/uL (ref 0.0–0.1)
Immature Granulocytes: 0 %
Lymphocytes Absolute: 3.4 10*3/uL — ABNORMAL HIGH (ref 0.7–3.1)
Lymphs: 46 %
MCH: 31 pg (ref 26.6–33.0)
MCHC: 32.7 g/dL (ref 31.5–35.7)
MCV: 95 fL (ref 79–97)
Monocytes Absolute: 0.7 10*3/uL (ref 0.1–0.9)
Monocytes: 9 %
Neutrophils Absolute: 3 10*3/uL (ref 1.4–7.0)
Neutrophils: 41 %
Platelets: 227 10*3/uL (ref 150–450)
RBC: 4.2 x10E6/uL (ref 3.77–5.28)
RDW: 12.5 % (ref 11.7–15.4)
WBC: 7.3 10*3/uL (ref 3.4–10.8)

## 2021-10-29 LAB — IGE: IgE (Immunoglobulin E), Serum: 108 IU/mL (ref 6–495)

## 2021-10-29 NOTE — Progress Notes (Signed)
Let pt know good news no allergic cause for asthma so not a candidate for one of the new allergy asthma injections however if she would like I can refer her to an asthma specialist for further testing

## 2021-10-29 NOTE — Telephone Encounter (Signed)
-----   Message from [name], MD sent at 10/29/2021  7:08 AM EDT ----- Let pt know good news no allergic cause for asthma so not a candidate for one of the new allergy asthma injections however if she would like I can refer her to an asthma specialist for further testing

## 2021-10-29 NOTE — Telephone Encounter (Signed)
Pt was called and is aware of results, DOB was confirmed.  ?

## 2021-10-31 NOTE — Telephone Encounter (Signed)
Ref to asthma MD sent    lymphocytes not signicatn

## 2021-10-31 NOTE — Addendum Note (Signed)
Addended by: Shan Levans E on: 10/31/2021 04:16 PM   Modules accepted: Orders

## 2021-11-01 NOTE — Telephone Encounter (Signed)
Called patient and she is aware

## 2021-11-24 ENCOUNTER — Other Ambulatory Visit: Payer: Self-pay | Admitting: Pharmacist

## 2021-11-24 ENCOUNTER — Other Ambulatory Visit: Payer: Self-pay

## 2021-11-24 MED ORDER — ALBUTEROL SULFATE HFA 108 (90 BASE) MCG/ACT IN AERS
2.0000 | INHALATION_SPRAY | RESPIRATORY_TRACT | 6 refills | Status: DC | PRN
  Filled 2021-11-24: qty 6.7, 25d supply, fill #0
  Filled 2021-12-17: qty 6.7, 25d supply, fill #1
  Filled 2021-12-17: qty 6.7, 17d supply, fill #1
  Filled 2022-02-21: qty 6.7, 17d supply, fill #2
  Filled 2022-03-30: qty 6.7, 17d supply, fill #3
  Filled 2022-05-03: qty 6.7, 17d supply, fill #4
  Filled 2022-06-06: qty 6.7, 17d supply, fill #5
  Filled 2022-07-16: qty 6.7, 17d supply, fill #6

## 2021-11-25 ENCOUNTER — Other Ambulatory Visit: Payer: Self-pay

## 2021-12-16 ENCOUNTER — Encounter: Payer: Self-pay | Admitting: Critical Care Medicine

## 2021-12-17 ENCOUNTER — Other Ambulatory Visit: Payer: Self-pay

## 2021-12-17 ENCOUNTER — Encounter: Payer: Self-pay | Admitting: Critical Care Medicine

## 2021-12-17 MED ORDER — BENZONATATE 100 MG PO CAPS
100.0000 mg | ORAL_CAPSULE | Freq: Two times a day (BID) | ORAL | 0 refills | Status: DC | PRN
  Filled 2021-12-17: qty 20, 10d supply, fill #0

## 2021-12-17 MED ORDER — AZITHROMYCIN 250 MG PO TABS
ORAL_TABLET | ORAL | 0 refills | Status: AC
  Filled 2021-12-17: qty 6, 5d supply, fill #0

## 2021-12-17 NOTE — Telephone Encounter (Signed)
Please see the MyChart message reply(ies) for my assessment and plan.  ?  ?This patient gave consent for this Medical Advice Message and is aware that it may result in a bill to their insurance company, as well as the possibility of receiving a bill for a co-payment or deductible. They are an established patient, but are not seeking medical advice exclusively about a problem treated during an in person or video visit in the last seven days. I did not recommend an in person or video visit within seven days of my reply.  ?  ?I spent a total of 8 minutes cumulative time within 7 days through MyChart messaging. ? ?Zaivion Kundrat, MD   ?

## 2021-12-22 ENCOUNTER — Institutional Professional Consult (permissible substitution): Payer: Self-pay | Admitting: Pulmonary Disease

## 2021-12-28 ENCOUNTER — Ambulatory Visit: Payer: Self-pay | Admitting: Critical Care Medicine

## 2022-01-06 ENCOUNTER — Other Ambulatory Visit: Payer: Self-pay

## 2022-01-07 ENCOUNTER — Other Ambulatory Visit: Payer: Self-pay

## 2022-01-13 ENCOUNTER — Ambulatory Visit: Payer: Self-pay | Admitting: Internal Medicine

## 2022-01-27 ENCOUNTER — Telehealth: Payer: Self-pay | Admitting: Pulmonary Disease

## 2022-02-10 ENCOUNTER — Institutional Professional Consult (permissible substitution): Payer: Self-pay | Admitting: Pulmonary Disease

## 2022-02-21 ENCOUNTER — Other Ambulatory Visit: Payer: Self-pay

## 2022-02-21 ENCOUNTER — Other Ambulatory Visit: Payer: Self-pay | Admitting: Critical Care Medicine

## 2022-02-21 ENCOUNTER — Other Ambulatory Visit (HOSPITAL_COMMUNITY): Payer: Self-pay

## 2022-02-21 DIAGNOSIS — J4540 Moderate persistent asthma, uncomplicated: Secondary | ICD-10-CM

## 2022-02-21 MED ORDER — MONTELUKAST SODIUM 10 MG PO TABS
10.0000 mg | ORAL_TABLET | Freq: Every day | ORAL | 6 refills | Status: DC
  Filled 2022-02-21: qty 90, 90d supply, fill #0

## 2022-02-22 ENCOUNTER — Other Ambulatory Visit: Payer: Self-pay

## 2022-03-10 ENCOUNTER — Ambulatory Visit: Payer: Managed Care, Other (non HMO) | Attending: Critical Care Medicine | Admitting: Critical Care Medicine

## 2022-03-10 ENCOUNTER — Other Ambulatory Visit: Payer: Self-pay

## 2022-03-10 ENCOUNTER — Encounter: Payer: Self-pay | Admitting: Critical Care Medicine

## 2022-03-10 VITALS — BP 135/84 | HR 98 | Ht 60.0 in | Wt 192.4 lb

## 2022-03-10 DIAGNOSIS — J4540 Moderate persistent asthma, uncomplicated: Secondary | ICD-10-CM

## 2022-03-10 MED ORDER — MONTELUKAST SODIUM 10 MG PO TABS
10.0000 mg | ORAL_TABLET | Freq: Every day | ORAL | 6 refills | Status: DC
  Filled 2022-03-10 – 2022-05-30 (×2): qty 90, 90d supply, fill #0
  Filled 2022-08-29: qty 90, 90d supply, fill #1
  Filled 2022-11-17: qty 90, 90d supply, fill #2
  Filled 2023-02-20: qty 90, 90d supply, fill #3

## 2022-03-10 MED ORDER — FLUTICASONE-SALMETEROL 500-50 MCG/ACT IN AEPB
1.0000 | INHALATION_SPRAY | Freq: Two times a day (BID) | RESPIRATORY_TRACT | 5 refills | Status: DC
  Filled 2022-03-10 – 2022-03-30 (×2): qty 180, 90d supply, fill #0
  Filled 2022-07-01: qty 180, 90d supply, fill #1
  Filled 2022-10-10: qty 180, 90d supply, fill #2

## 2022-03-10 MED ORDER — FLUTICASONE PROPIONATE 50 MCG/ACT NA SUSP
2.0000 | Freq: Every day | NASAL | 6 refills | Status: DC
  Filled 2022-03-10: qty 16, 30d supply, fill #0
  Filled 2022-05-03: qty 16, 30d supply, fill #1
  Filled 2022-05-30: qty 16, 30d supply, fill #2
  Filled 2022-07-01: qty 16, 30d supply, fill #3
  Filled 2022-07-27: qty 16, 30d supply, fill #4
  Filled 2022-08-29: qty 16, 30d supply, fill #5
  Filled 2022-10-10: qty 16, 30d supply, fill #6

## 2022-03-10 MED ORDER — OMEPRAZOLE 40 MG PO CPDR
40.0000 mg | DELAYED_RELEASE_CAPSULE | Freq: Every day | ORAL | 3 refills | Status: DC
  Filled 2022-03-10: qty 60, 60d supply, fill #0
  Filled 2022-05-03: qty 60, 60d supply, fill #1
  Filled 2022-07-27: qty 60, 60d supply, fill #2
  Filled 2022-09-20: qty 60, 60d supply, fill #3

## 2022-03-10 NOTE — Progress Notes (Signed)
Established Patient Office Visit  Subjective   Patient ID: Dawn Robertson, female    DOB: 07/25/1985  Age: 36 y.o. MRN: [id number]  Chief Complaint  Patient presents with   Asthma    10/26/21 This is a pleasant 36 year old female not seen since 2021 here for asthma evaluation.  On arrival blood pressure 121/83.  She now works from home and is about to obtain health insurance.  She rarely goes outside of her room.  She is planning on trip to [place] upcoming in September.  She had mold in her previous apartment she is now in a clean environment.  She does occasionally burn incense.  Patient did have to go to urgent care in May with an acute bronchitic spell she improved with prednisone and azithromycin.  Below is assessment from her asthma.  03/10/2022 Patient seen in return follow-up for asthma overall doing very well she went to [facility] earlier this year did well did not have to take prednisone or antibiotics.  On arrival blood pressure slightly elevated 135/84.  She needs a refill on omeprazole she is trying to make dietary changes to improve her reflux and she is no longer living on a mold infested house      Review of Systems  Constitutional:  Negative for chills, diaphoresis, fever, malaise/fatigue and weight loss.  HENT:  Negative for congestion, hearing loss, nosebleeds, sinus pain, sore throat and tinnitus.   Eyes:  Negative for blurred vision, photophobia and redness.  Respiratory:  Negative for cough, hemoptysis, sputum production, shortness of breath, wheezing and stridor.   Cardiovascular:  Negative for chest pain, palpitations, orthopnea, claudication, leg swelling and PND.  Gastrointestinal:  Negative for abdominal pain, blood in stool, constipation, diarrhea, heartburn, nausea and vomiting.  Genitourinary:  Negative for dysuria, flank pain, frequency, hematuria and urgency.  Musculoskeletal:  Negative for back pain, falls, joint pain, myalgias and neck pain.   Skin:  Negative for itching and rash.  Neurological:  Negative for dizziness, tingling, tremors, sensory change, speech change, focal weakness, seizures, loss of consciousness, weakness and headaches.  Endo/Heme/Allergies:  Negative for environmental allergies and polydipsia. Does not bruise/bleed easily.  Psychiatric/Behavioral:  Negative for depression, memory loss, substance abuse and suicidal ideas. The patient is not nervous/anxious and does not have insomnia.       Objective:     BP 135/84   Pulse 98   Ht 5' (1.524 m)   Wt 192 lb 6.4 oz (87.3 kg)   LMP 03/04/2022   SpO2 100%   BMI 37.58 kg/m    Physical Exam Vitals reviewed.  Constitutional:      Appearance: Normal appearance. She is well-developed. She is obese. She is not diaphoretic.  HENT:     Head: Normocephalic and atraumatic.     Nose: Nose normal. No nasal deformity, septal deviation, mucosal edema, congestion or rhinorrhea.     Right Sinus: No maxillary sinus tenderness or frontal sinus tenderness.     Left Sinus: No maxillary sinus tenderness or frontal sinus tenderness.     Mouth/Throat:     Mouth: Mucous membranes are moist.     Pharynx: Oropharynx is clear. No oropharyngeal exudate.  Eyes:     General: No scleral icterus.    Conjunctiva/sclera: Conjunctivae normal.     Pupils: Pupils are equal, round, and reactive to light.  Neck:     Thyroid: No thyromegaly.     Vascular: No carotid bruit or JVD.     Trachea:  Trachea normal. No tracheal tenderness or tracheal deviation.  Cardiovascular:     Rate and Rhythm: Normal rate and regular rhythm.     Chest Wall: PMI is not displaced.     Pulses: Normal pulses. No decreased pulses.     Heart sounds: Normal heart sounds, S1 normal and S2 normal. Heart sounds not distant. No murmur heard.    No systolic murmur is present.     No diastolic murmur is present.     No friction rub. No gallop. No S3 or S4 sounds.  Pulmonary:     Effort: Pulmonary effort is  normal. No tachypnea, accessory muscle usage or respiratory distress.     Breath sounds: Normal breath sounds. No stridor. No decreased breath sounds, wheezing, rhonchi or rales.  Chest:     Chest wall: No tenderness.  Abdominal:     General: Bowel sounds are normal. There is no distension.     Palpations: Abdomen is soft. Abdomen is not rigid.     Tenderness: There is no abdominal tenderness. There is no guarding or rebound.  Musculoskeletal:        General: Normal range of motion.     Cervical back: Normal range of motion and neck supple. No edema, erythema or rigidity. No muscular tenderness. Normal range of motion.  Lymphadenopathy:     Head:     Right side of head: No submental or submandibular adenopathy.     Left side of head: No submental or submandibular adenopathy.     Cervical: No cervical adenopathy.  Skin:    General: Skin is warm and dry.     Coloration: Skin is not pale.     Findings: No rash.     Nails: There is no clubbing.  Neurological:     Mental Status: She is alert and oriented to person, place, and time.     Sensory: No sensory deficit.  Psychiatric:        Speech: Speech normal.        Behavior: Behavior normal.    Went over with the patient a peak flow meter she has been using this before today her rate was 325  No results found for any visits on 03/10/22.    The ASCVD Risk score (Arnett DK, et al., 2019) failed to calculate for the following reasons:   The 2019 ASCVD risk score is only valid for ages 42 to 82    Assessment & Plan:   Problem List Items Addressed This Visit       Respiratory   Moderate persistent asthma (Chronic)    Moderate persistent asthma stable at this time continue with Singulair daily as needed albuterol and Advair disc 1 puff twice daily       Relevant Medications   fluticasone-salmeterol (ADVAIR DISKUS) 500-50 MCG/ACT AEPB   montelukast (SINGULAIR) 10 MG tablet  38 minutes spent assessing patient complex decision  making is high  Return in about 5 months (around 08/09/2022) for asthma.    Shan Levans, MD

## 2022-03-10 NOTE — Assessment & Plan Note (Signed)
Moderate persistent asthma stable at this time continue with Singulair daily as needed albuterol and Advair disc 1 puff twice daily

## 2022-03-10 NOTE — Patient Instructions (Signed)
Trial of omeprazole 1 daily take about 1/2-hour before you eat and then eat something for reflux Continue to follow the reflux diet  No change in the medications refill sent to your pharmacy  We talked about getting a simple saline nasal rinse 3 sprays each nostril twice daily over-the-counter  Return to Dr. Delford Field 5 months

## 2022-03-30 ENCOUNTER — Other Ambulatory Visit: Payer: Self-pay

## 2022-03-30 ENCOUNTER — Other Ambulatory Visit (HOSPITAL_COMMUNITY): Payer: Self-pay

## 2022-05-03 ENCOUNTER — Other Ambulatory Visit: Payer: Self-pay

## 2022-05-31 ENCOUNTER — Other Ambulatory Visit: Payer: Self-pay

## 2022-06-06 ENCOUNTER — Other Ambulatory Visit: Payer: Self-pay

## 2022-06-06 ENCOUNTER — Other Ambulatory Visit (HOSPITAL_COMMUNITY): Payer: Self-pay

## 2022-07-01 ENCOUNTER — Other Ambulatory Visit: Payer: Self-pay

## 2022-07-04 ENCOUNTER — Other Ambulatory Visit (HOSPITAL_COMMUNITY): Payer: Self-pay

## 2022-07-18 ENCOUNTER — Other Ambulatory Visit (HOSPITAL_COMMUNITY): Payer: Self-pay

## 2022-07-27 ENCOUNTER — Other Ambulatory Visit: Payer: Self-pay

## 2022-08-09 ENCOUNTER — Ambulatory Visit: Payer: Managed Care, Other (non HMO) | Admitting: Internal Medicine

## 2022-08-09 ENCOUNTER — Other Ambulatory Visit (HOSPITAL_COMMUNITY): Payer: Self-pay

## 2022-08-09 ENCOUNTER — Other Ambulatory Visit: Payer: Self-pay

## 2022-08-09 ENCOUNTER — Other Ambulatory Visit: Payer: Self-pay | Admitting: Internal Medicine

## 2022-08-09 MED ORDER — ALBUTEROL SULFATE HFA 108 (90 BASE) MCG/ACT IN AERS
2.0000 | INHALATION_SPRAY | RESPIRATORY_TRACT | 1 refills | Status: DC | PRN
  Filled 2022-08-09: qty 6.7, 16d supply, fill #0
  Filled 2022-08-29: qty 6.7, 16d supply, fill #1

## 2022-08-09 NOTE — Telephone Encounter (Signed)
Requested Prescriptions  Pending Prescriptions Disp Refills   albuterol (PROVENTIL HFA) 108 (90 Base) MCG/ACT inhaler 6.7 g 0    Sig: Inhale 2 puffs into the lungs every 4 (four) hours as needed for wheezing or shortness of breath.     Pulmonology:  Beta Agonists 2 Passed - 08/09/2022 11:50 AM      Passed - Last BP in normal range    BP Readings from Last 1 Encounters:  03/10/22 135/84         Passed - Last Heart Rate in normal range    Pulse Readings from Last 1 Encounters:  03/10/22 98         Passed - Valid encounter within last 12 months    Recent Outpatient Visits           5 months ago Moderate persistent asthma without complication   [place] [facility] [name], MD   9 months ago Moderate persistent asthma without complication   [place] [facility] [name], MD   1 year ago Moderate persistent asthma without complication   [facility] [name], MD   1 year ago Moderate persistent asthma without complication   [facility] [name], MD   1 year ago Moderate persistent asthma without complication   [facility] Marzella Schlein, [name]       Future Appointments             In 2 months Laural Benes, Binnie Rail, MD [facility]

## 2022-08-10 ENCOUNTER — Other Ambulatory Visit: Payer: Self-pay

## 2022-08-29 ENCOUNTER — Other Ambulatory Visit (HOSPITAL_COMMUNITY): Payer: Self-pay

## 2022-09-20 ENCOUNTER — Other Ambulatory Visit: Payer: Self-pay

## 2022-09-20 ENCOUNTER — Other Ambulatory Visit (HOSPITAL_COMMUNITY): Payer: Self-pay

## 2022-09-20 ENCOUNTER — Other Ambulatory Visit: Payer: Self-pay | Admitting: Internal Medicine

## 2022-09-20 DIAGNOSIS — J4540 Moderate persistent asthma, uncomplicated: Secondary | ICD-10-CM

## 2022-09-20 MED ORDER — IPRATROPIUM-ALBUTEROL 0.5-2.5 (3) MG/3ML IN SOLN
3.0000 mL | RESPIRATORY_TRACT | 0 refills | Status: AC | PRN
  Filled 2022-09-20: qty 360, 20d supply, fill #0

## 2022-09-20 MED ORDER — ALBUTEROL SULFATE HFA 108 (90 BASE) MCG/ACT IN AERS
2.0000 | INHALATION_SPRAY | RESPIRATORY_TRACT | 1 refills | Status: DC | PRN
  Filled 2022-09-20: qty 6.7, 17d supply, fill #0
  Filled 2022-10-25: qty 6.7, 17d supply, fill #1

## 2022-10-10 ENCOUNTER — Other Ambulatory Visit: Payer: Self-pay

## 2022-10-25 ENCOUNTER — Other Ambulatory Visit: Payer: Self-pay

## 2022-11-01 ENCOUNTER — Other Ambulatory Visit: Payer: Self-pay

## 2022-11-01 ENCOUNTER — Encounter: Payer: Self-pay | Admitting: Internal Medicine

## 2022-11-01 ENCOUNTER — Ambulatory Visit: Payer: Managed Care, Other (non HMO) | Attending: Internal Medicine | Admitting: Internal Medicine

## 2022-11-01 ENCOUNTER — Other Ambulatory Visit (HOSPITAL_COMMUNITY): Payer: Self-pay

## 2022-11-01 VITALS — BP 117/78 | HR 97 | Temp 98.3°F | Ht 60.0 in | Wt 182.0 lb

## 2022-11-01 DIAGNOSIS — E669 Obesity, unspecified: Secondary | ICD-10-CM

## 2022-11-01 DIAGNOSIS — J4540 Moderate persistent asthma, uncomplicated: Secondary | ICD-10-CM

## 2022-11-01 MED ORDER — FLUTICASONE-SALMETEROL 500-50 MCG/ACT IN AEPB
1.0000 | INHALATION_SPRAY | Freq: Two times a day (BID) | RESPIRATORY_TRACT | 5 refills | Status: DC
  Filled 2022-11-01 – 2023-01-10 (×2): qty 180, 90d supply, fill #0
  Filled 2023-01-18 – 2023-03-25 (×4): qty 180, 90d supply, fill #1
  Filled 2023-07-06 – 2023-07-14 (×3): qty 180, 90d supply, fill #2
  Filled 2023-10-10: qty 180, 90d supply, fill #3

## 2022-11-01 MED ORDER — PREDNISONE 20 MG PO TABS
ORAL_TABLET | ORAL | 0 refills | Status: AC
Start: 2022-11-01 — End: 2022-11-07
  Filled 2022-11-01 (×2): qty 4, 5d supply, fill #0

## 2022-11-01 MED ORDER — ALBUTEROL SULFATE HFA 108 (90 BASE) MCG/ACT IN AERS
2.0000 | INHALATION_SPRAY | RESPIRATORY_TRACT | 12 refills | Status: DC | PRN
  Filled 2022-11-01 – 2022-11-17 (×2): qty 6.7, 17d supply, fill #0
  Filled 2022-12-26: qty 6.7, 17d supply, fill #1
  Filled 2023-01-18: qty 6.7, 17d supply, fill #2
  Filled 2023-02-20: qty 6.7, 17d supply, fill #3
  Filled 2023-03-15: qty 6.7, 17d supply, fill #4
  Filled 2023-04-14 (×2): qty 6.7, 17d supply, fill #5
  Filled 2023-05-10: qty 6.7, 17d supply, fill #6
  Filled 2023-06-05: qty 6.7, 17d supply, fill #7
  Filled 2023-08-16: qty 6.7, 17d supply, fill #8
  Filled 2023-09-18: qty 6.7, 17d supply, fill #9
  Filled 2023-10-16: qty 6.7, 17d supply, fill #10

## 2022-11-01 MED ORDER — ALBUTEROL SULFATE HFA 108 (90 BASE) MCG/ACT IN AERS
2.0000 | INHALATION_SPRAY | RESPIRATORY_TRACT | 1 refills | Status: DC | PRN
  Filled 2022-11-01: qty 6.7, 17d supply, fill #0

## 2022-11-01 MED ORDER — FLUTICASONE PROPIONATE 50 MCG/ACT NA SUSP
2.0000 | Freq: Every day | NASAL | 6 refills | Status: DC
  Filled 2022-11-01 – 2022-11-17 (×2): qty 16, 30d supply, fill #0
  Filled 2022-12-26: qty 16, 30d supply, fill #1
  Filled 2023-02-23: qty 16, 30d supply, fill #2
  Filled 2023-06-05: qty 16, 30d supply, fill #3
  Filled 2023-07-06 – 2023-07-10 (×2): qty 16, 30d supply, fill #4
  Filled 2023-07-14 – 2023-07-19 (×2): qty 16, 30d supply, fill #0
  Filled 2023-08-16 – 2023-08-18 (×2): qty 16, 30d supply, fill #1
  Filled 2023-09-13 – 2023-09-18 (×2): qty 16, 30d supply, fill #2
  Filled ????-??-??: fill #4

## 2022-11-01 NOTE — Progress Notes (Signed)
Patient ID: Dawn Robertson, female    DOB: 1985/07/22  MRN: [id number]  CC: Asthma (Asthma f/u. Med refills. [place] upcoming trip - concerns about asthma flare-up. Herold Harms referral for gynecology for pap.)   Subjective: Dawn Robertson is a 37 y.o. female who presents for chronic ds management Her concerns today include:  Hx of Asthma moderate persistent, obesity, former tob dep, fhx of breast CA in mother, seasonal allergies and menorrhagia   Asthma:  has had increase use of Albuterol twice a day when it is very humid.  Has calm down over past few days..  Has neb but did not use during recent flare. Used it about 10 times this yr Compliant with Advair twice a day.  Still taking Singulair and Flonase nasal spray. Takes Singulair and Flonase early in the day if she is going to gym or grocery store. Plans to go to [facility] the 1st wk of Sept.  Fear the humidity will affect her breathing.  Do not plan to get flu shot.   Had 2 COVID shots and do not plan to get any more  Obesity:  going to gym 2-3x wk to walk on treadmill. Tries to cut off eating at 7 p.m.  Wgh down 10 lbs since last visit 02/2022. Would like to get to 160 lbs.   Patient Active Problem List   Diagnosis Date Noted   Primary dysmenorrhea 04/18/2019   Gastroesophageal reflux disease without esophagitis 07/10/2018   Former smoker 11/14/2016   Obesity (BMI 30.0-34.9) 11/14/2016   Anxiety state 11/14/2016   Positive depression screening 11/14/2016   Family history of breast cancer in mother 09/18/2015   Moderate persistent asthma      Current Outpatient Medications on File Prior to Visit  Medication Sig Dispense Refill   ibuprofen (ADVIL) 600 MG tablet Take 1 tablet (600 mg total) by mouth every 8 (eight) hours as needed. X2 days prior to menses and 2 days into cycle 90 tablet 1   ipratropium-albuterol (DUONEB) 0.5-2.5 (3) MG/3ML SOLN Take 3 mLs by nebulization every 4 (four) hours as needed. For shortness of  breath. 360 mL 0   montelukast (SINGULAIR) 10 MG tablet Take 1 tablet (10 mg total) by mouth at bedtime. 90 tablet 6   omeprazole (PRILOSEC) 40 MG capsule Take 1 capsule (40 mg total) by mouth daily. 60 capsule 3   Spacer/Aero-Holding Chambers (AEROCHAMBER MV) inhaler Use as instructed 1 each 0   Vitamin D, Ergocalciferol, (DRISDOL) 1.25 MG (50000 UT) CAPS capsule Take 1 capsule (50,000 Units total) by mouth every 7 (seven) days. 16 capsule 0   No current facility-administered medications on file prior to visit.    No Known Allergies  Social History   Socioeconomic History   Marital status: Single    Spouse name: Not on file   Number of children: Not on file   Years of education: Not on file   Highest education level: Not on file  Occupational History   Not on file  Tobacco Use   Smoking status: Former    Current packs/day: 0.00    Types: Cigarettes    Quit date: 03/28/2013    Years since quitting: 9.6   Smokeless tobacco: Never   Tobacco comments:    hx of 1 pk every 3 days  Vaping Use   Vaping status: Never Used  Substance and Sexual Activity   Alcohol use: Yes    Alcohol/week: 0.0 standard drinks of alcohol    Comment: daily glass  of wine   Drug use: No   Sexual activity: Not Currently    Birth control/protection: None  Other Topics Concern   Not on file  Social History Narrative   Not on file   Social Determinants of Health   Financial Resource Strain: Not on file  Food Insecurity: Not on file  Transportation Needs: Not on file  Physical Activity: Not on file  Stress: Not on file  Social Connections: Not on file  Intimate Partner Violence: Not on file    Family History  Problem Relation Age of Onset   Asthma Mother    Breast cancer Mother 78       s/p surgery and radiation   Other Mother        hx of hysterectomy for bleeding/blood clotting in her 30s   Asthma Father    Diabetes type II Paternal Aunt    Prostate cancer Other        maternal great  uncle (MGM's brother) d. later age    No past surgical history on file.  ROS: Review of Systems Negative except as stated above  PHYSICAL EXAM: BP 117/78 (BP Location: Left Arm, Patient Position: Sitting, Cuff Size: Large)   Pulse 97   Temp 98.3 F (36.8 C) (Oral)   Ht 5' (1.524 m)   Wt 182 lb (82.6 kg)   LMP 10/16/2022 (Exact Date)   SpO2 100%   BMI 35.54 kg/m   Wt Readings from Last 3 Encounters:  11/01/22 182 lb (82.6 kg)  03/10/22 192 lb 6.4 oz (87.3 kg)  10/26/21 191 lb 6.4 oz (86.8 kg)    Physical Exam  General appearance - alert, well appearing, middle age AAF and in no distress Mental status - normal mood, behavior, speech, dress, motor activity, and thought processes Neck - supple, no significant adenopathy Chest - clear to auscultation, no wheezes, rales or rhonchi, symmetric air entry Heart - normal rate, regular rhythm, normal S1, S2, no murmurs, rubs, clicks or gallops Extremities - peripheral pulses normal, no pedal edema, no clubbing or cyanosis      Latest Ref Rng & Units 03/06/2019    4:55 PM 04/25/2017   11:57 PM 11/28/2014    5:05 AM  CMP  Glucose 65 - 99 mg/dL 90  94  454   BUN 6 - 20 mg/dL 9  10  8    Creatinine 0.57 - 1.00 mg/dL 0.98  1.19  1.47   Sodium 134 - 144 mmol/L 136  139  136   Potassium 3.5 - 5.2 mmol/L 4.0  3.1  4.2   Chloride 96 - 106 mmol/L 102  104  109   CO2 20 - 29 mmol/L 22  26  22    Calcium 8.7 - 10.2 mg/dL 9.9  9.0  9.1   Total Protein 6.0 - 8.5 g/dL 7.9     Total Bilirubin 0.0 - 1.2 mg/dL 0.5     Alkaline Phos 39 - 117 IU/L 60     AST 0 - 40 IU/L 11     ALT 0 - 32 IU/L 9      Lipid Panel  No results found for: "CHOL", "TRIG", "HDL", "CHOLHDL", "VLDL", "LDLCALC", "LDLDIRECT"  CBC    Component Value Date/Time   WBC 7.3 10/26/2021 1630   WBC 9.9 04/25/2017 2357   RBC 4.20 10/26/2021 1630   RBC 4.11 04/25/2017 2357   HGB 13.0 10/26/2021 1630   HCT 39.7 10/26/2021 1630   PLT 227 10/26/2021 1630  MCV 95 10/26/2021  1630   MCH 31.0 10/26/2021 1630   MCH 34.3 (H) 04/25/2017 2357   MCHC 32.7 10/26/2021 1630   MCHC 35.3 04/25/2017 2357   RDW 12.5 10/26/2021 1630   LYMPHSABS 3.4 (H) 10/26/2021 1630   MONOABS 1.3 (H) 04/25/2017 2357   EOSABS 0.2 10/26/2021 1630   BASOSABS 0.1 10/26/2021 1630    ASSESSMENT AND PLAN: 1. Moderate persistent asthma without complication Patient to continue Advair inhaler and albuterol as needed. Continue Flonase nasal spray and Singulair as well. I have given a short course of prednisone to take with her on her trip to [facility] next month in case she has a flare. Encouraged her to reconsider getting the flu vaccine and COVID-19 booster next month. Wear a mask during travel. - fluticasone-salmeterol (ADVAIR DISKUS) 500-50 MCG/ACT AEPB; Inhale 1 puff into the lungs in the morning and at bedtime.  Dispense: 180 each; Refill: 5 - albuterol (PROVENTIL HFA) 108 (90 Base) MCG/ACT inhaler; Inhale 2 puffs into the lungs every 4 (four) hours as needed for wheezing or shortness of breath.  Dispense: 6.7 g; Refill: 12 - predniSONE (DELTASONE) 20 MG tablet; 1 tab Po daily x 3 days then 1/2 tab daily x 2 days  Dispense: 4 tablet; Refill: 0  2. Obesity (BMI 35.0-39.9 without comorbidity) Commended on weight loss.  Encouraged her to continue healthy eating habits and regular exercise. - CBC - Comprehensive metabolic panel - Lipid panel     Patient was given the opportunity to ask questions.  Patient verbalized understanding of the plan and was able to repeat key elements of the plan.   This documentation was completed using Paediatric nurse.  Any transcriptional errors are unintentional.  Orders Placed This Encounter  Procedures   CBC   Comprehensive metabolic panel   Lipid panel     Requested Prescriptions   Signed Prescriptions Disp Refills   fluticasone-salmeterol (ADVAIR DISKUS) 500-50 MCG/ACT AEPB 180 each 5    Sig: Inhale 1 puff into the lungs in  the morning and at bedtime.   fluticasone (FLONASE) 50 MCG/ACT nasal spray 16 g 6    Sig: Place 2 sprays into both nostrils daily.   albuterol (PROVENTIL HFA) 108 (90 Base) MCG/ACT inhaler 6.7 g 12    Sig: Inhale 2 puffs into the lungs every 4 (four) hours as needed for wheezing or shortness of breath.   predniSONE (DELTASONE) 20 MG tablet 4 tablet 0    Sig: 1 tab Po daily x 3 days then 1/2 tab daily x 2 days    Return in about 4 months (around 03/03/2023).  Jonah Blue, MD, FACP

## 2022-11-02 ENCOUNTER — Other Ambulatory Visit (HOSPITAL_COMMUNITY): Payer: Self-pay

## 2022-11-17 ENCOUNTER — Other Ambulatory Visit: Payer: Self-pay

## 2022-11-17 ENCOUNTER — Other Ambulatory Visit (HOSPITAL_COMMUNITY): Payer: Self-pay

## 2022-11-17 ENCOUNTER — Other Ambulatory Visit: Payer: Self-pay | Admitting: Critical Care Medicine

## 2022-11-17 MED ORDER — OMEPRAZOLE 40 MG PO CPDR
40.0000 mg | DELAYED_RELEASE_CAPSULE | Freq: Every day | ORAL | 3 refills | Status: DC
  Filled 2022-11-17: qty 60, 60d supply, fill #0
  Filled 2023-01-18: qty 60, 60d supply, fill #1
  Filled 2023-02-20 – 2023-03-15 (×2): qty 60, 60d supply, fill #2
  Filled 2023-07-06: qty 60, 60d supply, fill #3

## 2022-12-26 ENCOUNTER — Other Ambulatory Visit: Payer: Self-pay

## 2023-01-10 ENCOUNTER — Other Ambulatory Visit: Payer: Self-pay

## 2023-01-10 ENCOUNTER — Other Ambulatory Visit (HOSPITAL_COMMUNITY): Payer: Self-pay

## 2023-01-18 ENCOUNTER — Other Ambulatory Visit: Payer: Self-pay

## 2023-02-20 ENCOUNTER — Other Ambulatory Visit: Payer: Self-pay

## 2023-02-20 ENCOUNTER — Other Ambulatory Visit (HOSPITAL_COMMUNITY): Payer: Self-pay

## 2023-02-24 ENCOUNTER — Other Ambulatory Visit (HOSPITAL_COMMUNITY): Payer: Self-pay

## 2023-03-03 ENCOUNTER — Other Ambulatory Visit: Payer: Self-pay | Admitting: Family Medicine

## 2023-03-03 ENCOUNTER — Encounter: Payer: Self-pay | Admitting: Critical Care Medicine

## 2023-03-03 MED ORDER — PREDNISONE 20 MG PO TABS: 40.0000 mg | ORAL_TABLET | Freq: Every day | ORAL | 0 refills | Status: DC

## 2023-03-06 ENCOUNTER — Ambulatory Visit: Payer: Managed Care, Other (non HMO) | Admitting: Internal Medicine

## 2023-03-15 ENCOUNTER — Other Ambulatory Visit: Payer: Self-pay

## 2023-03-18 ENCOUNTER — Other Ambulatory Visit (HOSPITAL_COMMUNITY): Payer: Self-pay

## 2023-04-14 ENCOUNTER — Other Ambulatory Visit: Payer: Self-pay

## 2023-05-08 ENCOUNTER — Encounter: Payer: Self-pay | Admitting: Internal Medicine

## 2023-05-08 ENCOUNTER — Ambulatory Visit: Payer: Managed Care, Other (non HMO) | Attending: Internal Medicine | Admitting: Internal Medicine

## 2023-05-08 VITALS — BP 133/85 | HR 88 | Temp 98.4°F | Ht 60.0 in | Wt 178.0 lb

## 2023-05-08 DIAGNOSIS — E669 Obesity, unspecified: Secondary | ICD-10-CM

## 2023-05-08 DIAGNOSIS — Z6834 Body mass index (BMI) 34.0-34.9, adult: Secondary | ICD-10-CM

## 2023-05-08 DIAGNOSIS — R030 Elevated blood-pressure reading, without diagnosis of hypertension: Secondary | ICD-10-CM

## 2023-05-08 DIAGNOSIS — E66811 Obesity, class 1: Secondary | ICD-10-CM

## 2023-05-08 DIAGNOSIS — Z2821 Immunization not carried out because of patient refusal: Secondary | ICD-10-CM

## 2023-05-08 DIAGNOSIS — J4540 Moderate persistent asthma, uncomplicated: Secondary | ICD-10-CM

## 2023-05-08 DIAGNOSIS — Z5986 Financial insecurity: Secondary | ICD-10-CM

## 2023-05-08 NOTE — Progress Notes (Signed)
 Patient ID: Dawn Robertson, female    DOB: 09-20-1985  MRN: [id number]  CC: Obesity (Obesity f/u. Jenney Modest questions / concerns/No to all vax)   Subjective: Dawn Robertson is a 38 y.o. female who presents for chronic ds management. Her concerns today include:  Hx of Asthma moderate persistent, obesity, former tob dep, fhx of breast CA in mother, seasonal allergies and menorrhagia   Discussed the use of AI scribe software for clinical note transcription with the patient, who gave verbal consent to proceed.  History of Present Illness   The patient, with a history of asthma, presents for f/u visit. Had asthma flare two months ago that required prednisone . She reports nocturnal coughing with mucus production and throat clearing, particularly in the morning and when it gets cold at night. Coughing at nights occurs when she forgets to take her Singulair . She has been experimenting with a humidifier to alleviate symptoms. She uses her Advair  inhaler twice daily and albuterol  inhaler as needed, approximately four times in the past week. She has not needed her nebulizer since the flare up two months ago. She declines flu shot.  In terms of weight management, the patient has lost 14 pounds over the past year, from 192 to 178 pounds. She attributes this to changes in diet and exercise, although she has not been able to go to the gym recently due to lack of transportation. She expresses a desire to lose more weight and is considering walking and exercising at home. She reports recent cravings at night and has been trying to snack on healthier options like cucumbers and unsalted nuts.   BP elev today.      Patient Active Problem List   Diagnosis Date Noted   Primary dysmenorrhea 04/18/2019   Gastroesophageal reflux disease without esophagitis 07/10/2018   Former smoker 11/14/2016   Obesity (BMI 30.0-34.9) 11/14/2016   Anxiety state 11/14/2016   Positive depression screening 11/14/2016   Family  history of breast cancer in mother 09/18/2015   Moderate persistent asthma      Current Outpatient Medications on File Prior to Visit  Medication Sig Dispense Refill   albuterol  (PROVENTIL  HFA) 108 (90 Base) MCG/ACT inhaler Inhale 2 puffs into the lungs every 4 (four) hours as needed for wheezing or shortness of breath. 6.7 g 12   fluticasone  (FLONASE ) 50 MCG/ACT nasal spray Place 2 sprays into both nostrils daily. 16 g 6   fluticasone -salmeterol (ADVAIR  DISKUS) 500-50 MCG/ACT AEPB Inhale 1 puff into the lungs in the morning and at bedtime. 180 each 5   ibuprofen  (ADVIL ) 600 MG tablet Take 1 tablet (600 mg total) by mouth every 8 (eight) hours as needed. X2 days prior to menses and 2 days into cycle 90 tablet 1   ipratropium-albuterol  (DUONEB) 0.5-2.5 (3) MG/3ML SOLN Take 3 mLs by nebulization every 4 (four) hours as needed. For shortness of breath. 360 mL 0   montelukast  (SINGULAIR ) 10 MG tablet Take 1 tablet (10 mg total) by mouth at bedtime. 90 tablet 6   omeprazole  (PRILOSEC) 40 MG capsule Take 1 capsule (40 mg total) by mouth daily. 60 capsule 3   predniSONE  (DELTASONE ) 20 MG tablet Take 2 tablets (40 mg total) by mouth daily with breakfast. 5 tablet 0   Spacer/Aero-Holding Chambers (AEROCHAMBER MV) inhaler Use as instructed 1 each 0   Vitamin D , Ergocalciferol , (DRISDOL ) 1.25 MG (50000 UT) CAPS capsule Take 1 capsule (50,000 Units total) by mouth every 7 (seven) days. (Patient not taking: Reported on  05/08/2023) 16 capsule 0   No current facility-administered medications on file prior to visit.    No Known Allergies  Social History   Socioeconomic History   Marital status: Single    Spouse name: Not on file   Number of children: Not on file   Years of education: Not on file   Highest education level: Not on file  Occupational History   Not on file  Tobacco Use   Smoking status: Former    Current packs/day: 0.00    Types: Cigarettes    Quit date: 03/28/2013    Years since  quitting: 10.1   Smokeless tobacco: Never   Tobacco comments:    hx of 1 pk every 3 days  Vaping Use   Vaping status: Never Used  Substance and Sexual Activity   Alcohol use: Yes    Alcohol/week: 0.0 standard drinks of alcohol    Comment: daily glass of wine   Drug use: No   Sexual activity: Not Currently    Birth control/protection: None  Other Topics Concern   Not on file  Social History Narrative   Not on file   Social Drivers of Health   Financial Resource Strain: High Risk (05/08/2023)   Overall Financial Resource Strain (CARDIA)    Difficulty of Paying Living Expenses: Very hard  Food Insecurity: No Food Insecurity (05/08/2023)   Hunger Vital Sign    Worried About Running Out of Food in the Last Year: Never true    Ran Out of Food in the Last Year: Never true  Transportation Needs: No Transportation Needs (05/08/2023)   PRAPARE - Administrator, Civil Service (Medical): No    Lack of Transportation (Non-Medical): No  Physical Activity: Inactive (05/08/2023)   Exercise Vital Sign    Days of Exercise per Week: 0 days    Minutes of Exercise per Session: 0 min  Stress: No Stress Concern Present (05/08/2023)   [name] of Occupational Health - Occupational Stress Questionnaire    Feeling of Stress : Not at all  Social Connections: Socially Isolated (05/08/2023)   Social Connection and Isolation Panel [NHANES]    Frequency of Communication with Friends and Family: Three times a week    Frequency of Social Gatherings with Friends and Family: Once a week    Attends Religious Services: Never    Database administrator or Organizations: No    Attends Banker Meetings: Never    Marital Status: Never married  Intimate Partner Violence: Not At Risk (05/08/2023)   Humiliation, Afraid, Rape, and Kick questionnaire    Fear of Current or Ex-Partner: No    Emotionally Abused: No    Physically Abused: No    Sexually Abused: No    Family History   Problem Relation Age of Onset   Asthma Mother    Breast cancer Mother 16       s/p surgery and radiation   Other Mother        hx of hysterectomy for bleeding/blood clotting in her 37s   Asthma Father    Diabetes type II Paternal Aunt    Prostate cancer Other        maternal great uncle (MGM's brother) d. later age    No past surgical history on file.  ROS: Review of Systems Negative except as stated above  PHYSICAL EXAM: BP 133/85   Pulse 88   Temp 98.4 F (36.9 C) (Oral)   Ht 5' (1.524 m)  Wt 178 lb (80.7 kg)   SpO2 100%   BMI 34.76 kg/m   Wt Readings from Last 3 Encounters:  05/08/23 178 lb (80.7 kg)  11/01/22 182 lb (82.6 kg)  03/10/22 192 lb 6.4 oz (87.3 kg)    Physical Exam  General appearance - alert, well appearing, middle age AAF and in no distress Neck - supple, no significant adenopathy Chest - clear to auscultation, no wheezes, rales or rhonchi, symmetric air entry Heart - normal rate, regular rhythm, normal S1, S2, no murmurs, rubs, clicks or gallops Extremities - peripheral pulses normal, no pedal edema, no clubbing or cyanosis      Latest Ref Rng & Units 03/06/2019    4:55 PM 04/25/2017   11:57 PM 11/28/2014    5:05 AM  CMP  Glucose 65 - 99 mg/dL 90  94  829   BUN 6 - 20 mg/dL 9  10  8    Creatinine 0.57 - 1.00 mg/dL 5.62  1.30  8.65   Sodium 134 - 144 mmol/L 136  139  136   Potassium 3.5 - 5.2 mmol/L 4.0  3.1  4.2   Chloride 96 - 106 mmol/L 102  104  109   CO2 20 - 29 mmol/L 22  26  22    Calcium 8.7 - 10.2 mg/dL 9.9  9.0  9.1   Total Protein 6.0 - 8.5 g/dL 7.9     Total Bilirubin 0.0 - 1.2 mg/dL 0.5     Alkaline Phos 39 - 117 IU/L 60     AST 0 - 40 IU/L 11     ALT 0 - 32 IU/L 9      Lipid Panel  No results found for: "CHOL", "TRIG", "HDL", "CHOLHDL", "VLDL", "LDLCALC", "LDLDIRECT"  CBC    Component Value Date/Time   WBC 7.3 10/26/2021 1630   WBC 9.9 04/25/2017 2357   RBC 4.20 10/26/2021 1630   RBC 4.11 04/25/2017 2357   HGB 13.0  10/26/2021 1630   HCT 39.7 10/26/2021 1630   PLT 227 10/26/2021 1630   MCV 95 10/26/2021 1630   MCH 31.0 10/26/2021 1630   MCH 34.3 (H) 04/25/2017 2357   MCHC 32.7 10/26/2021 1630   MCHC 35.3 04/25/2017 2357   RDW 12.5 10/26/2021 1630   LYMPHSABS 3.4 (H) 10/26/2021 1630   MONOABS 1.3 (H) 04/25/2017 2357   EOSABS 0.2 10/26/2021 1630   BASOSABS 0.1 10/26/2021 1630    ASSESSMENT AND PLAN: 1. Moderate persistent asthma without complication (Primary) Continue Advair  inhaler twice a day as prescribed and albuterol  as needed. Recommend using a pillbox and keeping it next to her toothbrush showed that she remembers to take her Singulair  at night.  Also advised using the Flonase  nasal spray a few times a week to help decrease any postnasal drip at night that may contribute to cough  2. Obesity (BMI 30.0-34.9) Commended her on weight loss so far.  Encouraged her to continue to try to eat healthy.  Recommend purchasing healthier snacks like fruits or nuts.  3. Elevated blood pressure reading in office without diagnosis of hypertension DASH diet discussed and encouraged.  Teacher, English as a foreign language given.  4. Influenza vaccination declined   Patient was given the opportunity to ask questions.  Patient verbalized understanding of the plan and was able to repeat key elements of the plan.   This documentation was completed using Paediatric nurse.  Any transcriptional errors are unintentional.  No orders of the defined types were placed in this encounter.  Requested Prescriptions    No prescriptions requested or ordered in this encounter    Return in about 7 weeks (around 06/26/2023) for for BP recheck.  Dawn Dee, MD, FACP

## 2023-05-08 NOTE — Patient Instructions (Signed)

## 2023-05-10 ENCOUNTER — Other Ambulatory Visit (HOSPITAL_COMMUNITY): Payer: Self-pay

## 2023-06-05 ENCOUNTER — Other Ambulatory Visit: Payer: Self-pay

## 2023-06-05 ENCOUNTER — Other Ambulatory Visit: Payer: Self-pay | Admitting: Critical Care Medicine

## 2023-06-05 DIAGNOSIS — J4540 Moderate persistent asthma, uncomplicated: Secondary | ICD-10-CM

## 2023-06-05 MED ORDER — MONTELUKAST SODIUM 10 MG PO TABS
10.0000 mg | ORAL_TABLET | Freq: Every day | ORAL | 6 refills | Status: AC
  Filled 2023-06-05: qty 90, 90d supply, fill #0
  Filled 2023-09-13: qty 90, 90d supply, fill #1
  Filled 2023-11-04 – 2023-12-13 (×2): qty 90, 90d supply, fill #2

## 2023-06-06 ENCOUNTER — Other Ambulatory Visit: Payer: Self-pay

## 2023-06-22 ENCOUNTER — Other Ambulatory Visit: Payer: Self-pay | Admitting: Family Medicine

## 2023-06-22 ENCOUNTER — Ambulatory Visit: Payer: Managed Care, Other (non HMO) | Admitting: Internal Medicine

## 2023-06-24 ENCOUNTER — Other Ambulatory Visit: Payer: Self-pay | Admitting: Family Medicine

## 2023-06-24 MED ORDER — PREDNISONE 20 MG PO TABS: 40.0000 mg | ORAL_TABLET | Freq: Every day | ORAL | 0 refills | Status: DC

## 2023-06-26 ENCOUNTER — Encounter (HOSPITAL_BASED_OUTPATIENT_CLINIC_OR_DEPARTMENT_OTHER): Payer: Self-pay

## 2023-06-27 ENCOUNTER — Other Ambulatory Visit (HOSPITAL_BASED_OUTPATIENT_CLINIC_OR_DEPARTMENT_OTHER): Payer: Self-pay

## 2023-06-27 DIAGNOSIS — J45901 Unspecified asthma with (acute) exacerbation: Secondary | ICD-10-CM

## 2023-07-06 ENCOUNTER — Other Ambulatory Visit (HOSPITAL_COMMUNITY): Payer: Self-pay

## 2023-07-06 ENCOUNTER — Other Ambulatory Visit: Payer: Self-pay

## 2023-07-11 ENCOUNTER — Other Ambulatory Visit: Payer: Self-pay

## 2023-07-11 ENCOUNTER — Other Ambulatory Visit (HOSPITAL_COMMUNITY): Payer: Self-pay

## 2023-07-12 ENCOUNTER — Other Ambulatory Visit (HOSPITAL_COMMUNITY): Payer: Self-pay

## 2023-07-12 ENCOUNTER — Other Ambulatory Visit: Payer: Self-pay

## 2023-07-14 ENCOUNTER — Other Ambulatory Visit (HOSPITAL_COMMUNITY): Payer: Self-pay

## 2023-07-14 ENCOUNTER — Other Ambulatory Visit: Payer: Self-pay

## 2023-08-16 ENCOUNTER — Other Ambulatory Visit: Payer: Self-pay

## 2023-08-16 ENCOUNTER — Other Ambulatory Visit (HOSPITAL_COMMUNITY): Payer: Self-pay

## 2023-09-13 ENCOUNTER — Other Ambulatory Visit: Payer: Self-pay | Admitting: Internal Medicine

## 2023-09-13 ENCOUNTER — Other Ambulatory Visit (HOSPITAL_COMMUNITY): Payer: Self-pay

## 2023-09-13 MED ORDER — OMEPRAZOLE 40 MG PO CPDR
40.0000 mg | DELAYED_RELEASE_CAPSULE | Freq: Every day | ORAL | 3 refills | Status: AC
  Filled 2023-09-13: qty 60, 60d supply, fill #0
  Filled 2023-11-04: qty 60, 60d supply, fill #1
  Filled 2024-04-09: qty 60, 60d supply, fill #2

## 2023-09-14 ENCOUNTER — Other Ambulatory Visit: Payer: Self-pay

## 2023-09-19 ENCOUNTER — Other Ambulatory Visit: Payer: Self-pay

## 2023-10-10 ENCOUNTER — Other Ambulatory Visit: Payer: Self-pay

## 2023-10-16 ENCOUNTER — Other Ambulatory Visit: Payer: Self-pay | Admitting: Internal Medicine

## 2023-10-17 ENCOUNTER — Other Ambulatory Visit (HOSPITAL_COMMUNITY): Payer: Self-pay

## 2023-10-17 ENCOUNTER — Other Ambulatory Visit: Payer: Self-pay

## 2023-10-17 MED ORDER — FLUTICASONE PROPIONATE 50 MCG/ACT NA SUSP
2.0000 | Freq: Every day | NASAL | 6 refills | Status: AC
  Filled 2023-10-17: qty 16, 30d supply, fill #0

## 2023-10-18 ENCOUNTER — Other Ambulatory Visit: Payer: Self-pay

## 2023-11-04 ENCOUNTER — Other Ambulatory Visit: Payer: Self-pay | Admitting: Internal Medicine

## 2023-11-04 ENCOUNTER — Other Ambulatory Visit (HOSPITAL_COMMUNITY): Payer: Self-pay

## 2023-11-04 DIAGNOSIS — J4540 Moderate persistent asthma, uncomplicated: Secondary | ICD-10-CM

## 2023-11-06 ENCOUNTER — Other Ambulatory Visit (HOSPITAL_COMMUNITY): Payer: Self-pay

## 2023-11-06 ENCOUNTER — Other Ambulatory Visit: Payer: Self-pay

## 2023-11-06 MED ORDER — ALBUTEROL SULFATE HFA 108 (90 BASE) MCG/ACT IN AERS
2.0000 | INHALATION_SPRAY | RESPIRATORY_TRACT | 2 refills | Status: AC | PRN
  Filled 2023-11-06: qty 6.7, 17d supply, fill #0
  Filled 2023-12-13: qty 6.7, 17d supply, fill #1
  Filled 2024-01-30 – 2024-02-01 (×4): qty 6.7, 17d supply, fill #2

## 2023-12-13 ENCOUNTER — Other Ambulatory Visit (HOSPITAL_COMMUNITY): Payer: Self-pay

## 2023-12-28 ENCOUNTER — Other Ambulatory Visit (HOSPITAL_COMMUNITY): Payer: Self-pay

## 2023-12-28 ENCOUNTER — Other Ambulatory Visit: Payer: Self-pay | Admitting: Internal Medicine

## 2023-12-28 DIAGNOSIS — J4540 Moderate persistent asthma, uncomplicated: Secondary | ICD-10-CM

## 2024-01-02 ENCOUNTER — Other Ambulatory Visit: Payer: Self-pay

## 2024-01-02 ENCOUNTER — Other Ambulatory Visit: Payer: Self-pay | Admitting: Internal Medicine

## 2024-01-02 ENCOUNTER — Other Ambulatory Visit (HOSPITAL_COMMUNITY): Payer: Self-pay

## 2024-01-02 DIAGNOSIS — J4540 Moderate persistent asthma, uncomplicated: Secondary | ICD-10-CM

## 2024-01-02 MED ORDER — FLUTICASONE-SALMETEROL 500-50 MCG/ACT IN AEPB
1.0000 | INHALATION_SPRAY | Freq: Two times a day (BID) | RESPIRATORY_TRACT | 0 refills | Status: DC
  Filled 2024-01-02: qty 60, 30d supply, fill #0

## 2024-01-03 ENCOUNTER — Other Ambulatory Visit: Payer: Self-pay

## 2024-01-03 ENCOUNTER — Other Ambulatory Visit (HOSPITAL_COMMUNITY): Payer: Self-pay

## 2024-01-03 ENCOUNTER — Other Ambulatory Visit: Payer: Self-pay | Admitting: Internal Medicine

## 2024-01-03 ENCOUNTER — Encounter: Payer: Self-pay | Admitting: Internal Medicine

## 2024-01-03 DIAGNOSIS — J4540 Moderate persistent asthma, uncomplicated: Secondary | ICD-10-CM

## 2024-01-03 MED ORDER — FLUTICASONE-SALMETEROL 500-50 MCG/ACT IN AEPB
1.0000 | INHALATION_SPRAY | Freq: Two times a day (BID) | RESPIRATORY_TRACT | 2 refills | Status: DC
  Filled 2024-01-03 (×2): qty 60, 30d supply, fill #0
  Filled 2024-02-07: qty 60, 30d supply, fill #1
  Filled 2024-03-08: qty 60, 30d supply, fill #2

## 2024-01-03 MED ORDER — FLUTICASONE-SALMETEROL 500-50 MCG/ACT IN AEPB
1.0000 | INHALATION_SPRAY | Freq: Two times a day (BID) | RESPIRATORY_TRACT | 0 refills | Status: DC
Start: 2024-01-03 — End: 2024-01-03
  Filled 2024-01-03 (×2): qty 60, 30d supply, fill #0

## 2024-01-04 ENCOUNTER — Other Ambulatory Visit: Payer: Self-pay

## 2024-01-05 ENCOUNTER — Other Ambulatory Visit: Payer: Self-pay

## 2024-01-30 ENCOUNTER — Other Ambulatory Visit: Payer: Self-pay

## 2024-01-31 ENCOUNTER — Other Ambulatory Visit (HOSPITAL_COMMUNITY): Payer: Self-pay

## 2024-02-01 ENCOUNTER — Other Ambulatory Visit: Payer: Self-pay

## 2024-02-02 ENCOUNTER — Other Ambulatory Visit (HOSPITAL_COMMUNITY): Payer: Self-pay

## 2024-02-02 ENCOUNTER — Other Ambulatory Visit (HOSPITAL_BASED_OUTPATIENT_CLINIC_OR_DEPARTMENT_OTHER): Payer: Self-pay

## 2024-02-02 ENCOUNTER — Other Ambulatory Visit: Payer: Self-pay

## 2024-02-07 ENCOUNTER — Other Ambulatory Visit (HOSPITAL_BASED_OUTPATIENT_CLINIC_OR_DEPARTMENT_OTHER): Payer: Self-pay

## 2024-03-09 ENCOUNTER — Other Ambulatory Visit (HOSPITAL_COMMUNITY): Payer: Self-pay

## 2024-04-03 ENCOUNTER — Other Ambulatory Visit: Payer: Self-pay

## 2024-04-03 ENCOUNTER — Telehealth: Payer: Self-pay | Admitting: Physician Assistant

## 2024-04-03 DIAGNOSIS — J4541 Moderate persistent asthma with (acute) exacerbation: Secondary | ICD-10-CM

## 2024-04-03 MED ORDER — PROMETHAZINE-DM 6.25-15 MG/5ML PO SYRP
5.0000 mL | ORAL_SOLUTION | Freq: Four times a day (QID) | ORAL | 0 refills | Status: AC | PRN
  Filled 2024-04-03: qty 118, 6d supply, fill #0

## 2024-04-03 MED ORDER — AZITHROMYCIN 250 MG PO TABS
ORAL_TABLET | ORAL | 0 refills | Status: AC
  Filled 2024-04-03: qty 6, 5d supply, fill #0

## 2024-04-03 MED ORDER — ONDANSETRON 4 MG PO TBDP
4.0000 mg | ORAL_TABLET | Freq: Three times a day (TID) | ORAL | 0 refills | Status: AC | PRN
  Filled 2024-04-03: qty 20, 7d supply, fill #0

## 2024-04-03 MED ORDER — PREDNISONE 20 MG PO TABS
40.0000 mg | ORAL_TABLET | Freq: Every day | ORAL | 0 refills | Status: AC
  Filled 2024-04-03: qty 14, 7d supply, fill #0

## 2024-04-03 NOTE — Progress Notes (Signed)
 " Virtual Visit Consent   Dawn Robertson, you are scheduled for a virtual visit with a [facility] provider today. Just as with appointments in the office, your consent must be obtained to participate. Your consent will be active for this visit and any virtual visit you may have with one of our providers in the next 365 days. If you have a MyChart account, a copy of this consent can be sent to you electronically.  As this is a virtual visit, video technology does not allow for your provider to perform a traditional examination. This may limit your provider's ability to fully assess your condition. If your provider identifies any concerns that need to be evaluated in person or the need to arrange testing (such as labs, EKG, etc.), we will make arrangements to do so. Although advances in technology are sophisticated, we cannot ensure that it will always work on either your end or our end. If the connection with a video visit is poor, the visit may have to be switched to a telephone visit. With either a video or telephone visit, we are not always able to ensure that we have a secure connection.  By engaging in this virtual visit, you consent to the provision of healthcare and authorize for your insurance to be billed (if applicable) for the services provided during this visit. Depending on your insurance coverage, you may receive a charge related to this service.  I need to obtain your verbal consent now. Are you willing to proceed with your visit today? Dawn Robertson has provided verbal consent on 04/03/2024 for a virtual visit (video or telephone). Delon CHRISTELLA Dickinson, PA-C  Date: 04/03/2024 4:40 PM   Virtual Visit via Video Note   I, Delon CHRISTELLA Dickinson, connected with  Dawn Robertson  ([id number], May 24, 1985) on 04/03/2024 at  4:15 PM EST by a video-enabled telemedicine application and verified that I am speaking with the correct person using two identifiers.  Location: Patient: Virtual  Visit Location Patient: Home Provider: Virtual Visit Location Provider: Home Office   I discussed the limitations of evaluation and management by telemedicine and the availability of in person appointments. The patient expressed understanding and agreed to proceed.    History of Present Illness: Dawn Robertson is a 39 y.o. who identifies as a female who was assigned female at birth, and is being seen today for cough, chest tightness.  HPI: URI  This is a new problem. The current episode started in the past 7 days (Saturday, 03/30/24). The problem has been unchanged. Maximum temperature: subjective fevers. The fever has been present for 1 to 2 days. Associated symptoms include chest pain (tightness), congestion, coughing, diarrhea (loose stool with gas on Sunday), headaches, nausea (yesterday), a plugged ear sensation (ringing in ear with coughing today), sinus pain (left maxillary), a sore throat and wheezing (worse with laying down). Pertinent negatives include no ear pain, rhinorrhea or vomiting. Associated symptoms comments: Hoarse voice, body aches, diaphoresis, pain over sternum with palpation, shortness of breath with talking, decreased appetite. Treatments tried: nebulizer-duoneb, advair , albuterol , singulair . The treatment provided no relief.    Problems:  Patient Active Problem List   Diagnosis Date Noted   Primary dysmenorrhea 04/18/2019   Gastroesophageal reflux disease without esophagitis 07/10/2018   Former smoker 11/14/2016   Obesity (BMI 30.0-34.9) 11/14/2016   Anxiety state 11/14/2016   Positive depression screening 11/14/2016   Family history of breast cancer in mother 09/18/2015   Moderate persistent asthma  Allergies: Allergies[1] Medications: Current Medications[2]  Observations/Objective: Patient is well-developed, well-nourished in no acute distress.  Resting comfortably at home.  Head is normocephalic, atraumatic.  No labored breathing.  Speech is clear  and coherent with logical content.  Patient is alert and oriented at baseline.    Assessment and Plan: 1. Moderate persistent asthma with acute exacerbation (Primary) - azithromycin  (ZITHROMAX ) 250 MG tablet; Take 2 tablets on day 1, then 1 tablet daily on days 2 through 5  Dispense: 6 tablet; Refill: 0 - predniSONE  (DELTASONE ) 20 MG tablet; Take 2 tablets (40 mg total) by mouth daily with breakfast.  Dispense: 14 tablet; Refill: 0 - promethazine -dextromethorphan (PROMETHAZINE -DM) 6.25-15 MG/5ML syrup; Take 5 mLs by mouth 4 (four) times daily as needed.  Dispense: 118 mL; Refill: 0 - ondansetron  (ZOFRAN -ODT) 4 MG disintegrating tablet; Take 1 tablet (4 mg total) by mouth every 8 (eight) hours as needed.  Dispense: 20 tablet; Refill: 0  - Worsening over a week despite OTC medications - Will treat with Z-pack, Prednisone , Promethazine  DM - Can continue Mucinex  (PLAIN) during the daytime - Continue inhalers and nebulizer as prescribed - Add Zofran  for nausea  - Push fluids.  - Rest.  - Steam and humidifier can help - Seek in person evaluation if worsening or symptoms fail to improve    Follow Up Instructions: I discussed the assessment and treatment plan with the patient. The patient was provided an opportunity to ask questions and all were answered. The patient agreed with the plan and demonstrated an understanding of the instructions.  A copy of instructions were sent to the patient via MyChart unless otherwise noted below.    The patient was advised to call back or seek an in-person evaluation if the symptoms worsen or if the condition fails to improve as anticipated.    Delon HERO Giann Obara, PA-C     [1] No Known Allergies [2]  Current Outpatient Medications:    azithromycin  (ZITHROMAX ) 250 MG tablet, Take 2 tablets on day 1, then 1 tablet daily on days 2 through 5, Disp: 6 tablet, Rfl: 0   ondansetron  (ZOFRAN -ODT) 4 MG disintegrating tablet, Take 1 tablet (4 mg total) by mouth  every 8 (eight) hours as needed., Disp: 20 tablet, Rfl: 0   predniSONE  (DELTASONE ) 20 MG tablet, Take 2 tablets (40 mg total) by mouth daily with breakfast., Disp: 14 tablet, Rfl: 0   promethazine -dextromethorphan (PROMETHAZINE -DM) 6.25-15 MG/5ML syrup, Take 5 mLs by mouth 4 (four) times daily as needed., Disp: 118 mL, Rfl: 0   albuterol  (PROVENTIL  HFA) 108 (90 Base) MCG/ACT inhaler, Inhale 2 puffs into the lungs every 4 (four) hours as needed for wheezing or shortness of breath., Disp: 6.7 g, Rfl: 2   fluticasone  (FLONASE ) 50 MCG/ACT nasal spray, Place 2 sprays into both nostrils daily., Disp: 16 g, Rfl: 6   fluticasone -salmeterol (ADVAIR  DISKUS) 500-50 MCG/ACT AEPB, Inhale 1 puff into the lungs in the morning and at bedtime. Must have office visit for refills., Disp: 60 each, Rfl: 2   ibuprofen  (ADVIL ) 600 MG tablet, Take 1 tablet (600 mg total) by mouth every 8 (eight) hours as needed. X2 days prior to menses and 2 days into cycle, Disp: 90 tablet, Rfl: 1   ipratropium-albuterol  (DUONEB) 0.5-2.5 (3) MG/3ML SOLN, Take 3 mLs by nebulization every 4 (four) hours as needed. For shortness of breath., Disp: 360 mL, Rfl: 0   montelukast  (SINGULAIR ) 10 MG tablet, Take 1 tablet (10 mg total) by mouth at bedtime., Disp: 90 tablet,  Rfl: 6   omeprazole  (PRILOSEC) 40 MG capsule, Take 1 capsule (40 mg total) by mouth daily., Disp: 60 capsule, Rfl: 3   Spacer/Aero-Holding Chambers (AEROCHAMBER MV) inhaler, Use as instructed, Disp: 1 each, Rfl: 0   Vitamin D , Ergocalciferol , (DRISDOL ) 1.25 MG (50000 UT) CAPS capsule, Take 1 capsule (50,000 Units total) by mouth every 7 (seven) days. (Patient not taking: Reported on 05/08/2023), Disp: 16 capsule, Rfl: 0  "

## 2024-04-03 NOTE — Patient Instructions (Signed)
 " Dawn Robertson, thank you for joining Delon CHRISTELLA Dickinson, PA-C for today's virtual visit.  While this provider is not your primary care provider (PCP), if your PCP is located in our provider database this encounter information will be shared with them immediately following your visit.   A [facility] MyChart account gives you access to today's visit and all your visits, tests, and labs performed at [facility]  click here if you don't have a [facility] MyChart account or go to [email or web address]  Consent: (Patient) Dawn Robertson provided verbal consent for this virtual visit at the beginning of the encounter.  Current Medications:  Current Outpatient Medications:    azithromycin  (ZITHROMAX ) 250 MG tablet, Take 2 tablets on day 1, then 1 tablet daily on days 2 through 5, Disp: 6 tablet, Rfl: 0   ondansetron  (ZOFRAN -ODT) 4 MG disintegrating tablet, Take 1 tablet (4 mg total) by mouth every 8 (eight) hours as needed., Disp: 20 tablet, Rfl: 0   predniSONE  (DELTASONE ) 20 MG tablet, Take 2 tablets (40 mg total) by mouth daily with breakfast., Disp: 14 tablet, Rfl: 0   promethazine -dextromethorphan (PROMETHAZINE -DM) 6.25-15 MG/5ML syrup, Take 5 mLs by mouth 4 (four) times daily as needed., Disp: 118 mL, Rfl: 0   albuterol  (PROVENTIL  HFA) 108 (90 Base) MCG/ACT inhaler, Inhale 2 puffs into the lungs every 4 (four) hours as needed for wheezing or shortness of breath., Disp: 6.7 g, Rfl: 2   fluticasone  (FLONASE ) 50 MCG/ACT nasal spray, Place 2 sprays into both nostrils daily., Disp: 16 g, Rfl: 6   fluticasone -salmeterol (ADVAIR  DISKUS) 500-50 MCG/ACT AEPB, Inhale 1 puff into the lungs in the morning and at bedtime. Must have office visit for refills., Disp: 60 each, Rfl: 2   ibuprofen  (ADVIL ) 600 MG tablet, Take 1 tablet (600 mg total) by mouth every 8 (eight) hours as needed. X2 days prior to menses and 2 days into cycle, Disp: 90 tablet, Rfl: 1   ipratropium-albuterol   (DUONEB) 0.5-2.5 (3) MG/3ML SOLN, Take 3 mLs by nebulization every 4 (four) hours as needed. For shortness of breath., Disp: 360 mL, Rfl: 0   montelukast  (SINGULAIR ) 10 MG tablet, Take 1 tablet (10 mg total) by mouth at bedtime., Disp: 90 tablet, Rfl: 6   omeprazole  (PRILOSEC) 40 MG capsule, Take 1 capsule (40 mg total) by mouth daily., Disp: 60 capsule, Rfl: 3   Spacer/Aero-Holding Chambers (AEROCHAMBER MV) inhaler, Use as instructed, Disp: 1 each, Rfl: 0   Vitamin D , Ergocalciferol , (DRISDOL ) 1.25 MG (50000 UT) CAPS capsule, Take 1 capsule (50,000 Units total) by mouth every 7 (seven) days. (Patient not taking: Reported on 05/08/2023), Disp: 16 capsule, Rfl: 0   Medications ordered in this encounter:  Meds ordered this encounter  Medications   azithromycin  (ZITHROMAX ) 250 MG tablet    Sig: Take 2 tablets on day 1, then 1 tablet daily on days 2 through 5    Dispense:  6 tablet    Refill:  0    Supervising Provider:   LAMPTEY, PHILIP O [[id number]]   predniSONE  (DELTASONE ) 20 MG tablet    Sig: Take 2 tablets (40 mg total) by mouth daily with breakfast.    Dispense:  14 tablet    Refill:  0    Supervising Provider:   LAMPTEY, PHILIP O [[id number]]   promethazine -dextromethorphan (PROMETHAZINE -DM) 6.25-15 MG/5ML syrup    Sig: Take 5 mLs by mouth 4 (four) times daily as needed.    Dispense:  118 mL    Refill:  0    Supervising Provider:   LAMPTEY, PHILIP O [[id number]]   ondansetron  (ZOFRAN -ODT) 4 MG disintegrating tablet    Sig: Take 1 tablet (4 mg total) by mouth every 8 (eight) hours as needed.    Dispense:  20 tablet    Refill:  0    Supervising Provider:   LAMPTEY, PHILIP O [[id number]]     *If you need refills on other medications prior to your next appointment, please contact your pharmacy*  Follow-Up: Call back or seek an in-person evaluation if the symptoms worsen or if the condition fails to improve as anticipated.  [facility] [phone]  Other Instructions Acute  Bronchitis, Adult  Acute bronchitis is sudden inflammation of the main airways (bronchi) that come off the windpipe (trachea) in the lungs. The swelling causes the airways to get smaller and make more mucus than normal. This can make it hard to breathe and can cause coughing or noisy breathing (wheezing). Acute bronchitis may last several weeks. The cough may last longer. Allergies, asthma, and exposure to smoke may make the condition worse. What are the causes? This condition can be caused by germs and by substances that irritate the lungs, including: Cold and flu viruses. The most common cause of this condition is the virus that causes the common cold. Bacteria. This is less common. Breathing in substances that irritate the lungs, including: Smoke from cigarettes and other forms of tobacco. Dust and pollen. Fumes from household cleaning products, gases, or burned fuel. Indoor or outdoor air pollution. What increases the risk? The following factors may make you more likely to develop this condition: A weak body's defense system, also called the immune system. A condition that affects your lungs and breathing, such as asthma. What are the signs or symptoms? Common symptoms of this condition include: Coughing. This may bring up clear, yellow, or green mucus from your lungs (sputum). Wheezing. Runny or stuffy nose. Having too much mucus in your lungs (chest congestion). Shortness of breath. Aches and pains, including sore throat or chest. How is this diagnosed? This condition is usually diagnosed based on: Your symptoms and medical history. A physical exam. You may also have other tests, including tests to rule out other conditions, such as pneumonia. These tests include: A test of lung function. Test of a mucus sample to look for the presence of bacteria. Tests to check the oxygen  level in your blood. Blood tests. Chest X-ray. How is this treated? Most cases of acute bronchitis  clear up over time without treatment. Your health care provider may recommend: Drinking more fluids to help thin your mucus so it is easier to cough up. Taking inhaled medicine (inhaler) to improve air flow in and out of your lungs. Using a vaporizer or a humidifier. These are machines that add water to the air to help you breathe better. Taking a medicine that thins mucus and clears congestion (expectorant). Taking a medicine that prevents or stops coughing (cough suppressant). It is not common to take an antibiotic medicine for this condition. Follow these instructions at home:  Take over-the-counter and prescription medicines only as told by your health care provider. Use an inhaler, vaporizer, or humidifier as told by your health care provider. Take two teaspoons (10 mL) of honey at bedtime to lessen coughing at night. Drink enough fluid to keep your urine pale yellow. Do not use any products that contain nicotine or tobacco. These products include cigarettes, chewing tobacco, and vaping devices, such as  e-cigarettes. If you need help quitting, ask your health care provider. Get plenty of rest. Return to your normal activities as told by your health care provider. Ask your health care provider what activities are safe for you. Keep all follow-up visits. This is important. How is this prevented? To lower your risk of getting this condition again: Wash your hands often with soap and water for at least 20 seconds. If soap and water are not available, use hand sanitizer. Avoid contact with people who have cold symptoms. Try not to touch your mouth, nose, or eyes with your hands. Avoid breathing in smoke or chemical fumes. Breathing smoke or chemical fumes will make your condition worse. Get the flu shot every year. Contact a health care provider if: Your symptoms do not improve after 2 weeks. You have trouble coughing up the mucus. Your cough keeps you awake at night. You have a  fever. Get help right away if you: Cough up blood. Feel pain in your chest. Have severe shortness of breath. Faint or keep feeling like you are going to faint. Have a severe headache. Have a fever or chills that get worse. These symptoms may represent a serious problem that is an emergency. Do not wait to see if the symptoms will go away. Get medical help right away. Call your local emergency services (911 in the [place] Do not drive yourself to the hospital. Summary Acute bronchitis is inflammation of the main airways (bronchi) that come off the windpipe (trachea) in the lungs. The swelling causes the airways to get smaller and make more mucus than normal. Drinking more fluids can help thin your mucus so it is easier to cough up. Take over-the-counter and prescription medicines only as told by your health care provider. Do not use any products that contain nicotine or tobacco. These products include cigarettes, chewing tobacco, and vaping devices, such as e-cigarettes. If you need help quitting, ask your health care provider. Contact a health care provider if your symptoms do not improve after 2 weeks. This information is not intended to replace advice given to you by your health care provider. Make sure you discuss any questions you have with your health care provider. Document Revised: 06/24/2021 Document Reviewed: 07/15/2020 [employer] Patient Education  2024 [employer]   If you have been instructed to have an in-person evaluation today at a local Urgent Care facility, please use the link below. It will take you to a list of all of our available [facility], including address, phone number and hours of operation. Please do not delay care.  [facility]  If you or a family member do not have a primary care provider, use the link below to schedule a visit and establish care. When you choose a [facility] primary care physician or advanced practice provider, you gain  a long-term partner in health. Find a Primary Care Provider  Learn more about [facility]'s in-office and virtual care options: [facility] Get Care Now "

## 2024-04-09 ENCOUNTER — Other Ambulatory Visit: Payer: Self-pay

## 2024-04-09 ENCOUNTER — Other Ambulatory Visit: Payer: Self-pay | Admitting: Internal Medicine

## 2024-04-09 ENCOUNTER — Other Ambulatory Visit (HOSPITAL_COMMUNITY): Payer: Self-pay

## 2024-04-09 DIAGNOSIS — J4540 Moderate persistent asthma, uncomplicated: Secondary | ICD-10-CM

## 2024-04-09 MED ORDER — FLUTICASONE-SALMETEROL 500-50 MCG/ACT IN AEPB
1.0000 | INHALATION_SPRAY | Freq: Two times a day (BID) | RESPIRATORY_TRACT | 0 refills | Status: AC
  Filled 2024-04-09: qty 60, 30d supply, fill #0

## 2024-04-10 ENCOUNTER — Other Ambulatory Visit: Payer: Self-pay

## 2024-04-16 ENCOUNTER — Other Ambulatory Visit (HOSPITAL_COMMUNITY): Payer: Self-pay

## 2024-04-18 ENCOUNTER — Other Ambulatory Visit (HOSPITAL_COMMUNITY): Payer: Self-pay
